# Patient Record
Sex: Male | Born: 1950 | Race: White | Hispanic: No | Marital: Married | Smoking: Current every day smoker
Health system: Southern US, Community
[De-identification: ages and names within clinical notes are randomized; demographics above are authoritative.]

## PROBLEM LIST (undated history)

## (undated) DIAGNOSIS — Z8669 Personal history of other diseases of the nervous system and sense organs: Secondary | ICD-10-CM

## (undated) DIAGNOSIS — Z9289 Personal history of other medical treatment: Secondary | ICD-10-CM

## (undated) DIAGNOSIS — I10 Essential (primary) hypertension: Secondary | ICD-10-CM

## (undated) DIAGNOSIS — I2510 Atherosclerotic heart disease of native coronary artery without angina pectoris: Secondary | ICD-10-CM

## (undated) DIAGNOSIS — S129XXA Fracture of neck, unspecified, initial encounter: Secondary | ICD-10-CM

## (undated) DIAGNOSIS — C4490 Unspecified malignant neoplasm of skin, unspecified: Secondary | ICD-10-CM

## (undated) DIAGNOSIS — E785 Hyperlipidemia, unspecified: Secondary | ICD-10-CM

## (undated) DIAGNOSIS — I219 Acute myocardial infarction, unspecified: Secondary | ICD-10-CM

## (undated) DIAGNOSIS — M1990 Unspecified osteoarthritis, unspecified site: Secondary | ICD-10-CM

## (undated) DIAGNOSIS — R739 Hyperglycemia, unspecified: Secondary | ICD-10-CM

## (undated) DIAGNOSIS — F1190 Opioid use, unspecified, uncomplicated: Secondary | ICD-10-CM

## (undated) DIAGNOSIS — J449 Chronic obstructive pulmonary disease, unspecified: Secondary | ICD-10-CM

## (undated) DIAGNOSIS — K219 Gastro-esophageal reflux disease without esophagitis: Secondary | ICD-10-CM

## (undated) HISTORY — PX: CORONARY ANGIOPLASTY: SHX604

## (undated) HISTORY — DX: Personal history of other medical treatment: Z92.89

## (undated) HISTORY — PX: KNEE SURGERY: SHX244

## (undated) HISTORY — DX: Atherosclerotic heart disease of native coronary artery without angina pectoris: I25.10

## (undated) HISTORY — DX: Fracture of neck, unspecified, initial encounter: S12.9XXA

## (undated) HISTORY — PX: COLONOSCOPY: SHX174

## (undated) HISTORY — DX: Gastro-esophageal reflux disease without esophagitis: K21.9

## (undated) HISTORY — DX: Essential (primary) hypertension: I10

## (undated) HISTORY — DX: Hyperlipidemia, unspecified: E78.5

---

## 1997-05-17 HISTORY — PX: OTHER SURGICAL HISTORY: SHX169

## 1997-05-17 HISTORY — PX: SKIN CANCER EXCISION: SHX779

## 1998-05-17 HISTORY — PX: CARPAL TUNNEL RELEASE: SHX101

## 1999-05-18 HISTORY — PX: PARATHYROIDECTOMY: SHX19

## 2001-06-09 ENCOUNTER — Encounter: Payer: Self-pay | Admitting: Neurosurgery

## 2001-06-09 ENCOUNTER — Encounter: Admission: RE | Admit: 2001-06-09 | Discharge: 2001-06-09 | Payer: Self-pay | Admitting: Neurosurgery

## 2001-06-20 ENCOUNTER — Encounter: Payer: Self-pay | Admitting: Neurosurgery

## 2001-06-21 ENCOUNTER — Encounter: Payer: Self-pay | Admitting: Neurosurgery

## 2001-06-21 ENCOUNTER — Observation Stay (HOSPITAL_COMMUNITY): Admission: RE | Admit: 2001-06-21 | Discharge: 2001-06-22 | Payer: Self-pay | Admitting: Neurosurgery

## 2002-05-17 HISTORY — PX: LUMBAR SPINE SURGERY: SHX701

## 2002-08-03 ENCOUNTER — Ambulatory Visit (HOSPITAL_COMMUNITY): Admission: RE | Admit: 2002-08-03 | Discharge: 2002-08-03 | Payer: Self-pay | Admitting: Neurosurgery

## 2002-08-03 ENCOUNTER — Encounter: Payer: Self-pay | Admitting: Neurosurgery

## 2002-08-13 ENCOUNTER — Encounter: Payer: Self-pay | Admitting: Neurosurgery

## 2002-08-15 ENCOUNTER — Inpatient Hospital Stay (HOSPITAL_COMMUNITY): Admission: RE | Admit: 2002-08-15 | Discharge: 2002-08-19 | Payer: Self-pay | Admitting: Neurosurgery

## 2002-08-15 ENCOUNTER — Encounter: Payer: Self-pay | Admitting: Neurosurgery

## 2002-08-16 ENCOUNTER — Encounter: Payer: Self-pay | Admitting: Neurosurgery

## 2002-08-17 ENCOUNTER — Encounter: Payer: Self-pay | Admitting: Internal Medicine

## 2002-08-19 ENCOUNTER — Encounter: Payer: Self-pay | Admitting: Cardiothoracic Surgery

## 2003-01-25 ENCOUNTER — Encounter: Payer: Self-pay | Admitting: Neurosurgery

## 2003-01-25 ENCOUNTER — Ambulatory Visit (HOSPITAL_COMMUNITY): Admission: RE | Admit: 2003-01-25 | Discharge: 2003-01-25 | Payer: Self-pay | Admitting: Neurosurgery

## 2006-04-05 ENCOUNTER — Ambulatory Visit (HOSPITAL_COMMUNITY): Admission: RE | Admit: 2006-04-05 | Discharge: 2006-04-05 | Payer: Self-pay | Admitting: Neurosurgery

## 2006-04-13 DIAGNOSIS — Z9289 Personal history of other medical treatment: Secondary | ICD-10-CM

## 2006-04-13 HISTORY — DX: Personal history of other medical treatment: Z92.89

## 2006-05-17 DIAGNOSIS — S129XXA Fracture of neck, unspecified, initial encounter: Secondary | ICD-10-CM

## 2006-05-17 HISTORY — DX: Fracture of neck, unspecified, initial encounter: S12.9XXA

## 2006-06-10 ENCOUNTER — Inpatient Hospital Stay (HOSPITAL_COMMUNITY): Admission: RE | Admit: 2006-06-10 | Discharge: 2006-06-14 | Payer: Self-pay | Admitting: Neurosurgery

## 2006-09-06 ENCOUNTER — Ambulatory Visit (HOSPITAL_COMMUNITY): Admission: RE | Admit: 2006-09-06 | Discharge: 2006-09-06 | Payer: Self-pay | Admitting: Neurosurgery

## 2006-09-28 ENCOUNTER — Ambulatory Visit (HOSPITAL_COMMUNITY): Admission: RE | Admit: 2006-09-28 | Discharge: 2006-09-28 | Payer: Self-pay | Admitting: Neurosurgery

## 2006-10-05 ENCOUNTER — Ambulatory Visit (HOSPITAL_COMMUNITY): Admission: RE | Admit: 2006-10-05 | Discharge: 2006-10-06 | Payer: Self-pay | Admitting: Neurosurgery

## 2007-05-18 HISTORY — PX: CORONARY ARTERY BYPASS GRAFT: SHX141

## 2007-05-27 ENCOUNTER — Encounter: Admission: RE | Admit: 2007-05-27 | Discharge: 2007-05-27 | Payer: Self-pay | Admitting: Neurosurgery

## 2007-11-07 ENCOUNTER — Ambulatory Visit (HOSPITAL_COMMUNITY): Admission: RE | Admit: 2007-11-07 | Discharge: 2007-11-07 | Payer: Self-pay | Admitting: Neurosurgery

## 2008-02-29 ENCOUNTER — Inpatient Hospital Stay (HOSPITAL_COMMUNITY): Admission: EM | Admit: 2008-02-29 | Discharge: 2008-03-10 | Payer: Self-pay | Admitting: Emergency Medicine

## 2008-03-01 ENCOUNTER — Ambulatory Visit: Payer: Self-pay | Admitting: Thoracic Surgery (Cardiothoracic Vascular Surgery)

## 2008-03-01 ENCOUNTER — Encounter: Payer: Self-pay | Admitting: Thoracic Surgery (Cardiothoracic Vascular Surgery)

## 2008-03-29 ENCOUNTER — Ambulatory Visit: Payer: Self-pay | Admitting: Thoracic Surgery (Cardiothoracic Vascular Surgery)

## 2008-03-29 ENCOUNTER — Encounter
Admission: RE | Admit: 2008-03-29 | Discharge: 2008-03-29 | Payer: Self-pay | Admitting: Thoracic Surgery (Cardiothoracic Vascular Surgery)

## 2008-12-18 ENCOUNTER — Ambulatory Visit (HOSPITAL_COMMUNITY): Admission: RE | Admit: 2008-12-18 | Discharge: 2008-12-18 | Payer: Self-pay | Admitting: Neurosurgery

## 2009-10-21 ENCOUNTER — Encounter: Admission: RE | Admit: 2009-10-21 | Discharge: 2009-10-21 | Payer: Self-pay | Admitting: Cardiovascular Disease

## 2009-10-24 ENCOUNTER — Inpatient Hospital Stay (HOSPITAL_COMMUNITY): Admission: RE | Admit: 2009-10-24 | Discharge: 2009-10-25 | Payer: Self-pay | Admitting: Cardiovascular Disease

## 2009-10-24 HISTORY — PX: CARDIAC CATHETERIZATION: SHX172

## 2009-10-29 DIAGNOSIS — Z9289 Personal history of other medical treatment: Secondary | ICD-10-CM

## 2009-10-29 HISTORY — DX: Personal history of other medical treatment: Z92.89

## 2010-01-26 IMAGING — CR DG CHEST 1V PORT
1 series · 1 of 1 positions shown · non-contrast
Comparison: 06/10/2006 study

CLINICAL DATA: History given of chest pain.  History of tobacco
smoking.  History of coronary artery disease with stent placement.
Hypertension.

PORTABLE CHEST - 1 VIEW

[AP]
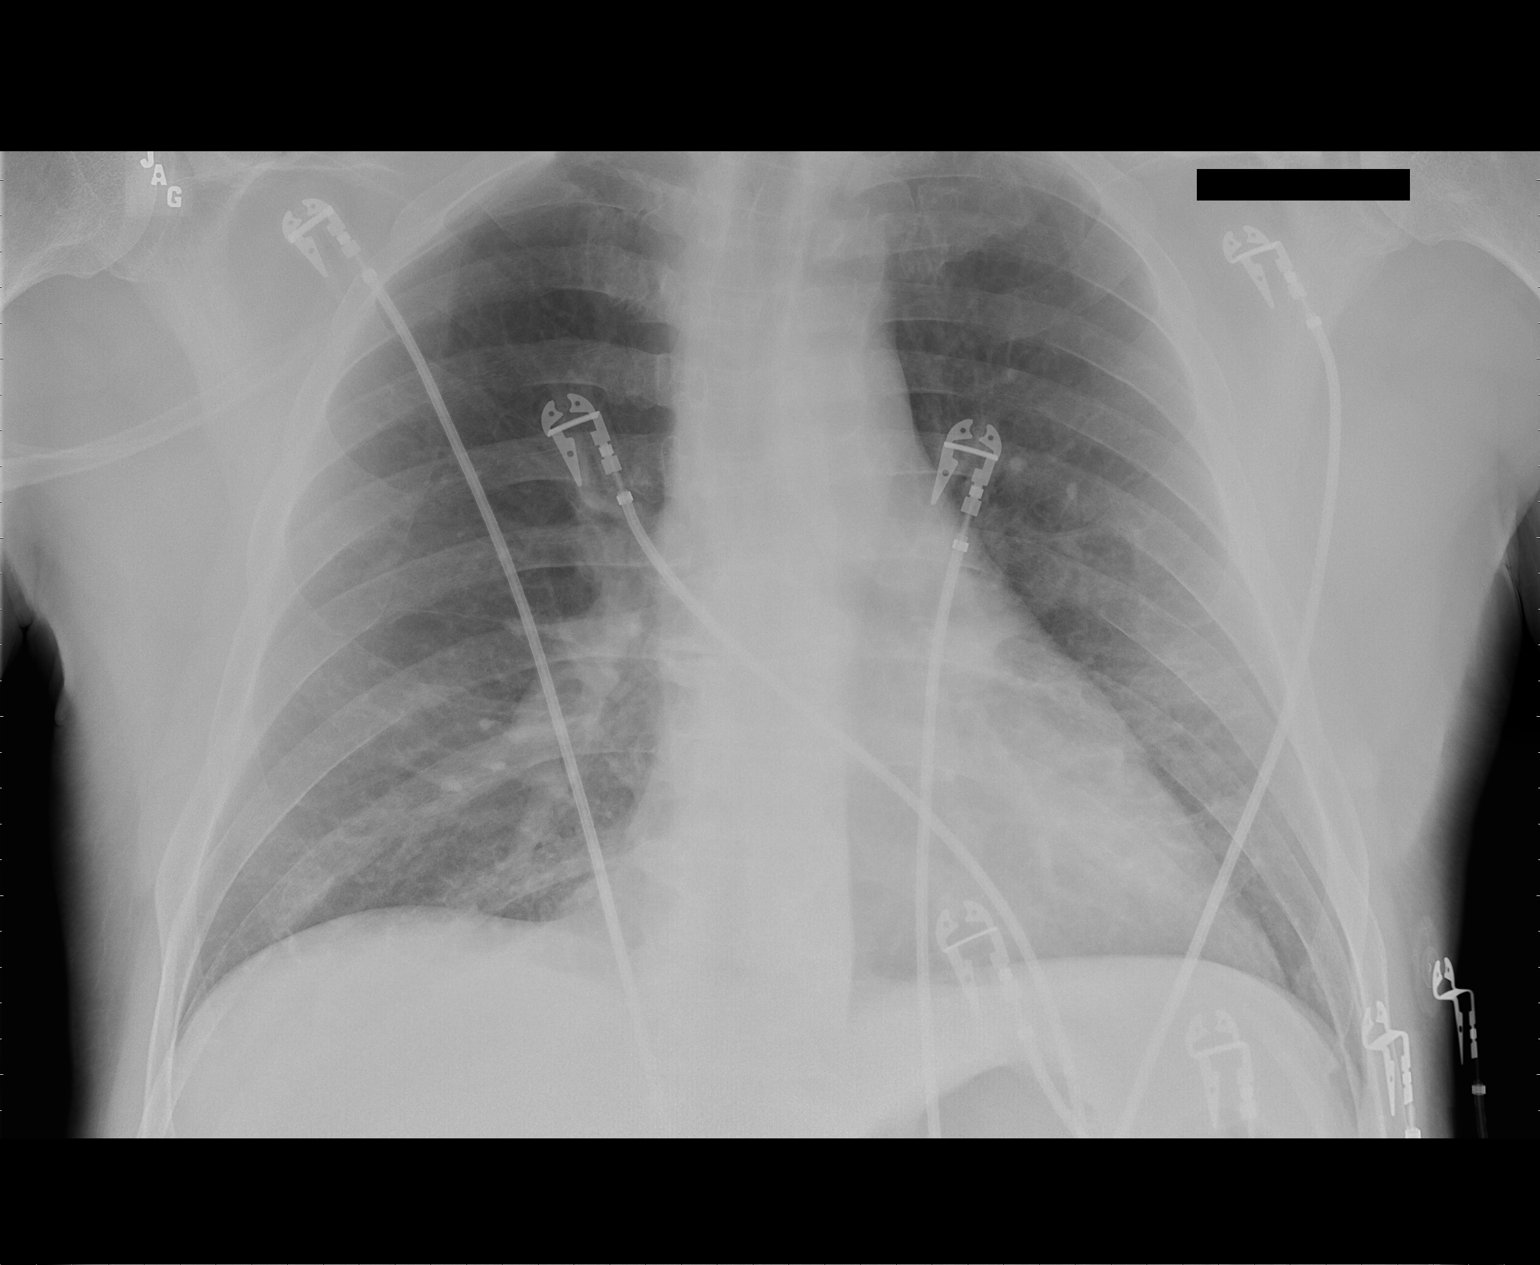

[1 of 1 positions shown; findings below may reference images not displayed]

FINDINGS: Cardiac silhouette is borderline in size accentuated by
AP magnification.  No pulmonary edema, pneumonia, or pleural
effusion is seen. Chronic slightly increased reticular markings in
the bases are seen. Bones appear average for age.
IMPRESSION: Borderline cardiac size.  No acute process evident.

## 2010-08-03 LAB — CBC
HCT: 36.6 % — ABNORMAL LOW (ref 39.0–52.0)
HCT: 36.7 % — ABNORMAL LOW (ref 39.0–52.0)
Hemoglobin: 12.4 g/dL — ABNORMAL LOW (ref 13.0–17.0)
MCHC: 33.9 g/dL (ref 30.0–36.0)
MCV: 92.4 fL (ref 78.0–100.0)
Platelets: 159 10*3/uL (ref 150–400)
RBC: 3.97 MIL/uL — ABNORMAL LOW (ref 4.22–5.81)
WBC: 6.6 10*3/uL (ref 4.0–10.5)
WBC: 7.9 10*3/uL (ref 4.0–10.5)

## 2010-08-03 LAB — BASIC METABOLIC PANEL
BUN: 13 mg/dL (ref 6–23)
Calcium: 8.7 mg/dL (ref 8.4–10.5)
Creatinine, Ser: 0.91 mg/dL (ref 0.4–1.5)
GFR calc non Af Amer: >60 mL/min (ref >60)
Potassium: 4.3 mEq/L (ref 3.5–5.1)

## 2010-08-03 LAB — MRSA PCR SCREENING: MRSA by PCR: NEGATIVE

## 2010-09-29 NOTE — Cardiovascular Report (Signed)
Robert Melton, Robert Melton NO.:  [id number]   MEDICAL RECORD NO.:  [id number]          PATIENT TYPE:  INP   LOCATION:  2922                         FACILITY:  [facility]   PHYSICIAN:  Nicki Guadalajara, M.D.     DATE OF BIRTH:  07-10-1950   DATE OF PROCEDURE:  03/01/2008  DATE OF DISCHARGE:                            CARDIAC CATHETERIZATION   INDICATIONS:  [name] is a 60 year old gentleman who has known  coronary artery disease.  He suffered a prior myocardial infarction  treated with angioplasty at [facility] in 1991.  In 1993, he underwent  coronary stenting at [facility] and his last catheterization was in August  2000.  He has a history of hypertension, mixed hyperlipidemia, as well  as degenerative back disease status post back surgery.  He was admitted  to [facility] yesterday after presenting to Dr. Clarene Duke' office  yesterday with several week history of initial indigestion-like  sensation, but ultimately, this had progressed to more chest pain and  ultimately chest pain awakened him from sleep yesterday morning.  His  symptom complex is worrisome for unstable angina.  He was transferred to  [facility] for admission and is now referred for cardiac  catheterization.   PROCEDURE:  After premedication with Versed 2 mg intravenously, the  patient was prepped and draped in the usual fashion.  His right femoral  artery was punctured anteriorly and a 5-French sheath was inserted.  Diagnostic catheterization was done utilizing 5-French Judkins for left  and right coronary catheters.  The right catheter was used for selective  angiography into the left subclavian internal mammary artery system due  to potential need for bypass surgery.  A pigtail catheter was used for  biplane cine left ventriculography as well as distal aortography.  Hemostasis was obtained by direct manual pressure.   HEMODYNAMIC DATA:  Central aortic pressure was 130/61.  Left ventricular  pressure was 130/18.   ANGIOGRAPHIC DATA:  Left main coronary was very short and immediately  bifurcated into an LAD and left circumflex system.   The LAD had 95% ostial stenosis encroaching upon the distal left main.  The LAD had 30% narrowing between 2 small diagonal vessels.  The LAD was  a twin-like vessel and an LAD and diagonal vessel, which did not reach  the apex.   The circumflex vessel had narrowing of 50% just beyond the origin  followed by 99% and then 90% stenosis before the first major marginal  vessel.   The right coronary artery had what appeared to be long segment of  stenting in its midsegment.  There was 30-40% narrowing in the proximal  to mid segment followed by 40-50% narrowing before the crux.  Beyond the  crux, there did appear to be eccentric 60-70% narrowing with a shelf-  like lesion, mild haziness.  The RCA supplied the PDA and PLA vessel.   The left subclavian and internal mammary artery were normal and suitable  for CABG surgery.   Biplane cine left ventriculography revealed low-normal LV contractility  with an ejection fraction of 50-55%.  There  was mild mid anterolateral  hypercontractility on the RAO projection, and on the LAO projection,  there was mid posterolateral hypercontractility.   Distal aortography revealed patent renal arteries.  There was no  significant aneurysm dilatation of his aorta or significant aortoiliac  disease.   IMPRESSION:  1. Low-normal left ventricular function with mild anterolateral-to-mid      posterolateral hypercontractility.  2. Severe native coronary obstructive disease with ostial 95% left      anterior descending stenosis with 30% proximal left anterior      descending narrowing; high-grade segmental 50-99 and 90% proximal      circumflex stenoses; and previously placed diffuse stenting of the      right coronary artery with in-stent narrowing of 30-40% proximally,      40-50% in the mid segment but with  evidence for progressive 60-70%      narrowing in the right coronary artery beyond the crux prior to      giving rise to the posterior descending artery and posterolateral      artery vessel.  3. No significant aortoiliac disease.   RECOMMENDATIONS:  Surgical consultation will be obtained for  consideration for CABG revascularization surgery.  The patient will be  transferred from 4700, and due to a high-grade stenoses, he will be  started on heparin following sheath removal until his probable CABG  revascularization.           ______________________________  Nicki Guadalajara, M.D.     TK/MEDQ  D:  03/01/2008  T:  03/02/2008  Job:  [id number]   cc:   Jeanmarie Plant  [facility]

## 2010-09-29 NOTE — Consult Note (Signed)
NAMETERRELLE, RUFFOLO NO.:  [id number]   MEDICAL RECORD NO.:  [id number]          PATIENT TYPE:  INP   LOCATION:                               FACILITY:  [facility]   PHYSICIAN:  Salvatore Decent. Dorris Fetch, M.D.DATE OF BIRTH:  24-Aug-1950   DATE OF CONSULTATION:  03/01/2008  DATE OF DISCHARGE:                                 CONSULTATION   REASON FOR CONSULTATION:  Three-vessel disease with unstable angina.   HISTORY OF PRESENT ILLNESS:  Mr. Vizcarrondo is a 60 year old gentleman with  a history of coronary disease dating back to age 26, roughly 19 years  ago, when he had a myocardial infarction.  He had another myocardial  infarction 2 years later and had PTCA and stenting of his right coronary  at that time.  He had done well since that time until the last couple of  weeks when he began having frequent pain, which he thought initially was  indigestion.  It initially was exertional but he has also been having  episodes at rest, and they have been coming after initially being very  brief, have now become frequent, occurring several times a day, and  lasting for longer periods of time.  He was awakened by the pain at 3:00  a.m. on February 29, 2008.  EMS was called, and he was brought to the  emergency room.  His initial cardiac enzymes were negative with a CK-MB  of less than 1 and a troponin of less than 0.05.  He ruled out for  myocardial infarction, and given his history and presentation, cardiac  catheterization was recommended.  Dr. Nicki Guadalajara did cardiac  catheterization today, which showed a 95% ostial LAD stenosis.  There  was a 99% stenosis in the left circumflex.  There was diffuse disease,  both in stent and extra stent disease in the right coronary.  His  ejection fraction was 50-55% with some mild anterolateral hypokinesis.   The patient is currently pain free.   His past medical history is significant for:  1. Coronary artery disease.  2. Myocardial infarction  x2.  3. PTCA and stenting of his right coronary.  4. Hypertension.  5. Hypercholesterolemia.  6. Previous knee surgery.  7. Previous carpal tunnel syndrome.  8. Broken neck due to fall off ladder last year.  9. Parathyroidectomy.  10.Four lower back surgeries.  11.Skin cancer inside the left cheek and behind his ear.  12.Possible TIA, 12 years ago.  13.Leg cramps.  14.Gastroesophageal reflux.  15.Degenerative joint disease.   MEDICATIONS ON ADMISSION:  1. Norvasc 5 mg daily.  2. Lisinopril 20 mg daily.  3. Prilosec, over the counter.  4. Taking a stool softener.  5. Crestor 20 mg daily.  6. Flexeril 10 mg once daily.  7. Klonopin p.r.n.  8. He was taking Aleve 2 tablets nightly.   He had stopped his aspirin.   He has no known drug allergies.   His family history is significant for coronary artery disease in his  mother at young age.   SOCIAL HISTORY:  He is married.  He  is disabled.  He did smoke but quit  for the most part of February, although he still smokes 2-3 cigarettes  per day.  He exercises very limited due to his disability.  He with his  wife cares for 4 grandchildren at home.   PHYSICAL EXAMINATION:  GENERAL:  Mr. Dube is a 60 year old white male  in no acute distress.  His general appearance is well developed and well  nourished.  NEUROLOGIC:  He is alert and oriented x3.  He does have weakness in his  right hand and upper extremity.  HEENT:  Unremarkable.  NECK:  Supple without thyromegaly, adenopathy, or bruits.  There is a  well-healed surgical incision.  CARDIAC:  Regular rate and rhythm.  Normal S1 and S2.  No rubs, murmurs,  or gallops.  ABDOMEN:  Soft and nontender.  EXTREMITIES:  Without clubbing, cyanosis, or edema.  He has multiple  healed surgical scars.  He has thenar wasting in the right hand.  Equivocal Allen test bilaterally.   REVIEW OF SYSTEMS:  It should be noted he has indigestion, history of  kidney stones.  No orthopnea,  paroxysmal nocturnal dyspnea, or  peripheral edema.  Arm and leg cramps and pain in multiple joints.  All  other systems are negative.   IMPRESSION:  Mr. Dues is a 60 year old gentleman who has a long-  standing history of coronary disease.  He now presents with an unstable  coronary syndrome and has severe 3-vessel coronary disease with  preserved left ventricular function at catheterization.  Coronary artery  bypass grafting is indicated for survival benefit as well as relief of  symptoms.  I have discussed in detail with him the indications, risks,  benefits, and alternatives.  He understands the risks to include but not  limited to death, stroke, MI, DVT, PE, bleeding, possible need for  transfusions, infections, as well as other organ system dysfunction  including respiratory, renal, or GI complications.  Given his disease,  we will plan to use bilateral mammary arteries with a right mammary to  the circumflex distribution or possibly to the LAD and place the left  mammary to the circumflex distribution.  He has 2 obtuse marginals, but  they interconnect and are not separately compromised and will need the  use of vein graft bypasses to posterior descending and posterolateral.  Mr. Sippel understands and accepts these risks and agrees to proceed.  We will plan to have a surgery on Monday morning, first case.      Salvatore Decent Dorris Fetch, M.D.  Electronically Signed    SCH/MEDQ  D:  03/01/2008  T:  03/02/2008  Job:  [id number]   cc:   Nicki Guadalajara, M.D.  Jeanmarie Plant

## 2010-09-29 NOTE — Discharge Summary (Signed)
Robert Melton, Robert Melton NO.:  [id number]   MEDICAL RECORD NO.:  [id number]          PATIENT TYPE:  INP   LOCATION:  2015                         FACILITY:  [facility]   PHYSICIAN:  Salvatore Decent. Dorris Fetch, M.D.DATE OF BIRTH:  17-Aug-1950   DATE OF ADMISSION:  02/29/2008  DATE OF DISCHARGE:                               DISCHARGE SUMMARY   FINAL DIAGNOSIS:  Three-vessel coronary artery disease with unstable  coronary syndrome.   IN-HOSPITAL DIAGNOSES:  1. Bilateral 40-60% internal carotid artery stenosis.  2. Thrombocytopenia postoperatively.  3. Acute blood loss anemia postoperatively.  4. Volume overload postoperatively.   SECONDARY DIAGNOSES:  1. History of coronary artery disease with myocardial infarction x2,      status post percutaneous transluminal coronary angioplasty and      stenting of his right coronary artery.  2. Hypertension.  3. Hypercholesterolemia.  4. Status post knee surgery.  5. Status post carpal tunnel surgery.  6. History of broken neck due to fall from a ladder last year.  7. Parathyroidectomy.  8. Lower back surgeries.  9. Skin cancer inside the left cheek and behind his ear.  10.Possible transient ischemic attack about 12 years ago.  11.Leg cramps.  12.Gastroesophageal reflux disease.  13.Degenerative joint disease.   IN-HOSPITAL OPERATIONS AND PROCEDURES:  1. Cardiac catheterization.  2. Coronary artery bypass grafting x4, off pump, using a left internal      mammary artery to left anterior descending, free right internal      mammary artery to obtuse marginal 1, sequential saphenous vein      graft to posterior descending posterolateral.  Endoscopic vein      harvest from left thigh done.   HISTORY AND PHYSICAL AND HOSPITAL COURSE:  Robert Melton is a 60 year old  gentleman with a longstanding history of coronary artery disease status  post myocardial infarction with PTCA and stenting of his right coronary  artery.  He presented  with unstable coronary syndrome and underwent  cardiac catheterization which showed severe three-vessel coronary artery  disease with mild decreased left ventricular function.  Dr. Dorris Fetch  was consulted.  Dr. Dorris Fetch evaluated the patient's cardiac  catheterization and discussed with the patient undergoing coronary  artery bypass grafting.  He discussed risks and benefits with the  patient.  The patient acknowledged understanding and agreed to proceed.  Surgery was scheduled for March 04, 2008.  Preoperatively, the patient  underwent bilateral carotid duplex ultrasound and this showed 40-60% ICA  stenosis bilaterally.  The patient remained stable preoperatively.  For  details of the patient's past medical history and physical exam, please  see dictated H&P.   The patient was taken to the operating room on March 04, 2008, by Dr.  Dorris Fetch where he underwent off-pump coronary artery bypass grafting  x4 using a left internal mammary artery to left anterior descending,  free right internal mammary artery to obtuse marginal 1, sequential  saphenous vein graft to posterior descending posterolateral branches.  Endoscopic vein harvest of left thigh was done.  The patient tolerated  this procedure and was transferred to the intensive care  unit in stable  condition.  The patient was noted to be hemodynamically stable  postoperatively.  He was able to be extubated evening of surgery.  Post-  extubation, the patient was noted to be alert and oriented x4, neuro  intact.  Post-extubation, the patient was placed on a Ventimask 50%.  O2  sats were stable.  Chest x-ray done, was stable with small left pleural  effusion.  He had minimal drainage from chest tubes and chest tubes were  discontinued in normal fashion.  The patient's pulmonary status was slow  to improve.  He was eventually able to be weaned off the face mask and  placed on nasal cannula with sats maintaining in the 90s.   Repeat chest  x-ray done showed mild pulmonary edema.  The patient was on diuretics.  By postop day #4, the patient currently remains on 4 liters with O2 sats  99%.  Repeat chest x-ray showed improving edema and air space disease.  The patient was started on Combivent inhaler.  We will continue to wean  the patient off oxygen as tolerated to  room air with O2 sats  maintaining greater than 90% prior to discharge home.  Postoperatively,  the patient was able to be weaned off all drips.  Swan-Ganz catheter was  discontinued in normal fashion.  External pacer had been set at 90 and  was turned off.  The patient's heart rate maintained greater than 60.  Heart rate and blood pressure were monitored.  He was started on low-  dose beta-blocker and restarted on low dose of his ACE inhibitors.  The  patient remained in normal sinus rhythm and blood pressure remained  stable.  Postoperatively, the patient did have some mild acute blood  loss anemia.  He was asymptomatic.  No transfusions were required.  His  hemoglobin and hematocrit were followed.  His H&H remained stable and by  postop day #4, it was 8.6 and 24.9.  We will continue to monitor this.  The patient did have some significant volume overload postoperatively.  He was started on diuretics.  This was helping with his pulmonary edema  as well as his peripheral edema.  Daily weights were obtained.  We will  plan to continue diuretics several days post discharge.  The patient was  back near his preoperative weight prior to discharge.  The patient also  had some mild thrombocytopenia postoperatively.  Platelet count dropped  to 97.  Lovenox was discontinued.  Platelet count was monitored and was  improving prior to discharge.  By postop day #4, it was 118.   Postoperatively, the patient was out of bed, ambulating well with  cardiac rehab.  He was able to be transferred out to 2000 on postop day  #3.  The patient was tolerating diet well.  No  nausea or vomiting noted.  All incisions were clean, dry, and intact and healing well.   The patient is tentatively ready for discharge home in the next 48 hours  pending he remained stable and pulmonary status improved.  Labs on  postop day #4 showed a white count of 6.7, hemoglobin of 8.6, hematocrit  24.9, and platelet count 118.  Sodium of 135, potassium 3.7, chloride of  102, bicarb of 25, BUN of 24, creatinine 0.8, and glucose of 89.  The  patient was noted to be afebrile.  He is back at baseline weight.   FOLLOWUP APPOINTMENTS:  A followup appointment has been arranged with  Dr. Dorris Fetch for  March 29, 2008, at 11:30 a.m..  The patient will  need to obtain PA and lateral chest x-ray 30 minutes prior to this  appointment.  The patient will need to follow up with Dr. Tresa Endo in 2  weeks.  He will need to contact Dr. Landry Dyke office to make these  arrangements.   ACTIVITY:  The patient is instructed no driving until released to do so.  No heavy lifting over 10 pounds.  He is told to ambulate 3-4 times per  day, progress as tolerated and continue his breathing exercises.   INCISIONAL CARE:  The patient is told to shower, washing his incisions  using soap and water.  He is to contact the office if he develops any  drainage or opening from any of his incision sites.   DIET:  The patient is educated on diet to be low-fat, low-salt.   DISCHARGE MEDICATIONS:  1. Crestor 20 mg daily.  2. Multivitamin daily.  3. Klonopin 0.5 mg p.r.n.  4. Lisinopril 5 mg daily.  5. Aspirin 325 mg daily.  6. Lopressor 25 mg b.i.d.  7. Lasix 40 mg daily x7 days.  8. Potassium chloride 20 mEq daily x7 days.  9. Oxycodone 5 mg 1-2 tablets q.4-6 h. p.r.n.  10.Combivent inhaler 2 puffs inhaled q.i.d.      Robert Belfast, PA      Salvatore Decent. Dorris Fetch, M.D.  Electronically Signed    KMD/MEDQ  D:  03/08/2008  T:  03/08/2008  Job:  [id number]   cc:   Salvatore Decent. Dorris Fetch, M.D.  Nicki Guadalajara, M.D.

## 2010-09-29 NOTE — Op Note (Signed)
NAMEJENCARLOS, NICOLSON NO.:  [id number]   MEDICAL RECORD NO.:  [id number]          PATIENT TYPE:  INP   LOCATION:  2302                         FACILITY:  [facility]   PHYSICIAN:  Salvatore Decent. 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   PROCEDURE:  Median sternotomy, off-pump coronary artery bypass grafting  x4 (left internal mammary artery to LAD, free right internal mammary  artery to obtuse marginal 1, sequential saphenous vein graft to  posterior descending and posterior lateral), and endoscopic vein  harvest, left thigh.   SURGEON:  Salvatore Decent. Dorris Fetch, MD   ASSISTANT:  Kerin Perna, MD   SECOND ASSISTANT:  Doree Fudge, PA   ANESTHESIA:  General.   FINDINGS:  Severe aortic calcified plaque.  No site for placement of  crossclamp.  Therefore, the patient done off pump.  Aorta at site of  proximal anastomosis relatively normal in thickness with no plaque.  Vein localized in right leg; vein from the left leg, good quality; both  mammary arteries, good quality; the posterior descending 1 mm poor  quality target; posterior lateral and obtuse marginal impaired quality  targets; LAD good quality target.   CLINICAL NOTE:  Mr. Wint is a 60 year old gentleman with a  longstanding history of coronary artery disease who presents with  unstable coronary syndrome and at catheterization had severe 3-vessel  coronary disease with mild decreased left ventricular function.  The  patient was advised to undergo coronary artery bypass grafting, the  indications, risks, benefits, and alternatives were discussed in detail  with the patient.  He understood and accepted  the risks and agreed to  proceed.   OPERATIVE NOTE:  Mr. Pina was brought to the preop holding area on  March 04, 2008.  Lines were placed by Anesthesia for monitoring  arterial central venous and pulmonary arterial pressure.  Intravenous  antibiotics were administered.  The patient was taken to the operating  room, anesthetized and intubated.  A Foley catheter was placed.  The  chest, abdomen, and legs were prepped and draped in usual fashion.  An  incision was made in the medial aspect of the right leg just below the  level near the greater saphenous vein could not be localized at that  site.  Incision made in the medial aspect of the left leg at the level  of the knee, and the greater saphenous vein was identified, there was  harvested endoscopically and was a good quality vessel.  Simultaneously,  a median sternotomy was performed.  The left internal mammary artery was  taken down using standard technique.  Two thousand units of heparin was  administered during the vessel, take down the left mammary artery had  excellent flow when divided distally.  The right internal mammary artery  then was harvested in similar fashion.  This initially  left attached  proximally to see if it would reach as a pedicle graft to the LAD.  However, it was not of sufficient length and therefore was used as a  free graft.  The proximal end was divided and the proximal stump was  suture ligated.   The pericardium was opened.  The aorta was inspected, and there was an  extensive calcified plaque anteriorly and laterally along the right side  of the aorta.  It would be possible to cannulate the aorta.  But not  possible safely place an aortic crossclamp.  Therefore, the decision was  made to attempt the procedure as an off-pump procedure to avoid need for  aortic clamping.  The full dose of heparin had already been  administered.  Anticoagulation was monitored with ACT measurement.  A  bear hugger was  placed on the patient to help maintain body temperature.  #1 silk deep pericardial stay sutures were placed and covered with Rumel  tourniquets.  Traction was placed on the pericardium prolapsing the apex  of the heart upwards and out of the chest which the patient tolerated  well hemodynamically.  A Guidant expose system was used for cardiac  retraction and stabilization.  The coronary arteries were inspected and  anastomotic sites were chosen, and the conduits were inspected and cut  to length.  The apex of the heart was retracted superiorly exposing the  inferior wall of the heart, posterior descending, posterolateral vessels  were identified.  The stabilizing device was placed at the proximal  portion of the posterior descending.  This was relatively small and very  diffusely diseased vessel even more so that apparent on catheterization.  An arteriotomy was made and extended proximally and distally.  A 1.5-mm  shunt would not pass into the vessel.  A Silastic loop was placed  proximally on the vessel for control with hemostasis.  The vessel was  poor quality.  A side-to-side anastomosis to the posterior descending  was performed with a running 7-0 Prolene suture.  A 1-mm probe did pass  distally through the anastomosis as well as proximally to the site of  more extensive plaquing in the proximal vessel.  The stabilizer then was  moved to the site for anastomosis on the posterior lateral branch.  The  vein graft was cut to length distally.  An arteriotomy was made, a 1.5  mm shunt was placed within the vessel, which was 1.5 mm in diameter.  The vein graft was anastomosed end-to-side with a running 7-0 Prolene  suture.  The shunt was removed prior to tying the suture.  Warm  heparinized saline was flushed through the graft.  There was excellent  flow.  There was good hemostasis at both anastomoses.   Next, the apex of the heart was retracted to the right side exposing the  lateral wall  of the heart.  The stabilizer bar was placed at the site on  the first obtuse marginal.  The patient had a 90% stenosis in his  circumflex, prior to the bifurcations of obtuse marginals 1 and 2, which  communicated.  The distal end of the right internal mammary artery was  beveled and was anastomosed in an end-to-side fashion with a running 8-0  Prolene suture to the obtuse marginal.  Shunt was again used during the  placement of this graft as well.  Completion of the anastomosis, the  shunt was removed.  The suture was tied.  There was good hemostasis.  There was  good backbleeding proximally and the bulldog clamp was placed  across the vessel.   The heart then was allowed to rest in a more natural location.  The  apical suction device was removed.  However, the patient developed ST  elevations at this point of the procedure.  The incision was made to  proceed with these proximal anastomoses with these grafts prior to doing  the mammary to LAD anastomosis.  The patient did remain hemodynamically  stable and did not have any malignant arrhythmias.  The Guidant  Heartstring III device was used to avoid placing a clamp on the aorta.  The vein graft was cut to length.  The 4.3-mm punch aortotomy was made  and the pursestring was deployed within the aorta.  There was a good  seal with good hemostasis.  The vein graft was anastomosed end-to-side  with a running 6-0 Prolene suture.  At the completion of the  anastomosis, the Heartstring device was removed prior to tying the  suture.  Residual air was aspirated from the vein graft.  Bulldog clamp  was removed and flow was established to the posterior descending and  posterior lateral.  At this time, the ST elevation had resolved.  The  short segment of the vein was anastomosed to the proximal end of the  free right mammary graft to allow better size match to the aortotomy.  This was done with running 7-0 Prolene suture.  Once again, a 4.3-mm   punch aortotomy was made in the aorta.  The Heartstring device was  deployed, and the proximal anastomosis was performed with a running 6-0  Prolene suture.  The heart string was removed and the suture was tied.   Next, traction was once again placed on the deep pericardial sutures  prolapsing the heart anteriorly, exposing the LAD.  The stabilizing  device was placed at the site of the anastomosis to the LAD, which was a  1.5-mm vessel.  An arteriotomy was made and a 1.5 mm shunt was placed  within the vessel.  The distal end of the left mammary artery was  beveled, was anastomosed end-to-side to the LAD with a running 8-0  Prolene suture.  At the completion of the mammary to LAD anastomosis,  the shunt was removed.  The bulldog clamps removed from the left mammary  artery, the anastomosis was de-aired, and the suture was tied.  After  inspecting for hemostasis, the mammary pedicle was tacked to the  epicardial surface of the heart with 6-0 Prolene sutures.   The final inspection for hemostasis was made at all proximal and distal  anastomoses.  A test dose protamine was administered, and was well  tolerated.  The remainder of the protamine was administered without  incident.  Hemostasis was achieved.  Epicardial pacing wires were placed  on the right ventricle and right atrium.  The pericardium was  reapproximated with interrupted 3-0 silk sutures after achieving  hemostasis.  Bilateral pleural and single mediastinal chest tube were  placed with separate subcostal incisions.  The sternum was closed with  single and double heavy gauge stainless steel wires.  The pectoralis  fascia, subcutaneous tissue, and skin were closed in standard fashion.  All sponge, needle, and instrument counts were correct at the end of  procedure.  There were no intraoperative complications.  The patient was  taken from the operating room to the surgical intensive care unit in  critical but stable  condition.   Note, care should be taken  if any attempts made at redo surgery in  regards to the calcified ascending aorta.      Salvatore Decent Dorris Fetch, M.D.  Electronically Signed     SCH/MEDQ  D:  03/04/2008  T:  03/05/2008  Job:  [id number]   cc:   Nicki Guadalajara, M.D.  Jeanmarie Plant

## 2010-09-29 NOTE — Assessment & Plan Note (Signed)
OFFICE VISIT   RAYAN, INES  DOB:  1950/12/14                                        March 29, 2008  CHART #:  [id number]   The patient is a 60 year old gentleman, who presented with unstable  angina.  He had coronary artery bypass grafting x4 on March 04, 2008.  This was done off-pump because of severe calcification of his ascending  aorta.  He did well postoperatively with no significant complications.  She continues to do well.  He still has some soreness.  He still takes  one or two Percocet before he goes to bed at night, but he does not have  any use them during the day.  He does not have any anginal-type  symptoms.  His exercise tolerance is good.  He is anxious to increase  his activities.   PHYSICAL EXAMINATION:  GENERAL:  The patient is a 60 year old white male  in no acute distress.  VITAL SIGNS:  His blood pressure is 105/90, pulse 68, respirations are  18, his oxygen saturation 97% on room air.  LUNGS:  Clear with equal breath sounds bilaterally.  CARDIAC:  Regular rate and rhythm.  Normal S1 and S2.  No murmurs, rubs,  or gallops.  CHEST:  Sternum is stable.  Sternal incision is clean, dry, and intact.  EXTREMITIES:  Leg incision is healing well.  There is no peripheral  edema.   Chest x-ray shows some linear atelectasis in the lingula, otherwise  unremarkable.   IMPRESSION:  The patient is a 60 year old gentleman.  He is doing well,  about 4 weeks out from off-pump coronary artery bypass grafting.  He is  not to lift anything greater than 10 pounds for another 2 weeks at least  and asked him to wait at least another month before using his shotgun.  He is anxious to go hunting and he may begin driving, appropriate  precautions were discussed.  He is not to drive while using narcotics.  He will continued to be followed by Dr.  Clarene Duke and Dr. Tresa Endo.  I would be happy to see him back anytime if I  can be of any further assistance  with his care.   Salvatore Decent Dorris Fetch, M.D.  Electronically Signed   SCH/MEDQ  D:  03/29/2008  T:  03/29/2008  Job:  [id number]   cc:   Nicki Guadalajara, M.D.  Jeanmarie Plant

## 2010-09-29 NOTE — Op Note (Signed)
NAMEROOK, MAUE NO.:  [id number]   MEDICAL RECORD NO.:  [id number]          PATIENT TYPE:  OIB   LOCATION:  3172                         FACILITY:  [facility]   PHYSICIAN:  Robert Melton, M.D.     DATE OF BIRTH:  10-Jan-1951   DATE OF PROCEDURE:  10/05/2006  DATE OF DISCHARGE:                               OPERATIVE REPORT   PREOPERATIVE DIAGNOSES:  1. Lumbar stenosis, L2-3.  2. Malposition, right L4 pedicle screw.   POSTOPERATIVE DIAGNOSES:  Lumbar stenosis L2-3, disk herniation L2-3,  malposition of screw.   PROCEDURE:  1. Removal of hardware right side L4 and S1 pedicle screws along with      the rod.  2. Decompressive laminectomy L2-3 with diskectomy L2-3 right side for      decompression.   COMPLICATIONS:  None   INDICATIONS:  [name] is a patient of mine whom underwent a  fusion from L4 to S1 and a left-sided decompression at the L2-3 space.  Postoperatively, he had no back pain, but he continued to have left  lower extremity pain.  I did a CT within the last month, and it showed  that the pedicle on the right side at L4 was medial entering the spinal  canal.  He had absolutely no pain nor did he have any weakness on that  side, but I did recommend that screw be repositioned.  He also continues  to have stenosis around L2-3 site, and I told him that I would just do a  much bigger decompression at that level.   OPERATIVE NOTE:  Ms. Robert Melton was brought to the operating room,  intubated and placed under general anesthetic without difficulty.  He  was rolled prone onto a Wilson frame, and all pressure points were  properly padded.  A Foley catheter had been placed under sterile  conditions without difficulty.  I opened his old incision with a #10  blade and took this down to the thoracolumbar fascia after he was  prepped and draped.  I then exposed the right side L4 pedicle screw and  S1 pedicle screw and rod.  I removed the caps, removed the  rod and then  removed the right L4 screw.  I was able to see the screw in the spinal  canal, so I retracted the thecal sac medially with a double-ended  ganglion knife, and Dr. Newell Melton who was my assistant was able to  unscrew the screw without difficulty.  After that and assessing the  situation, felt that there was no need to replace the hardware.  He had  screws on the left side, had an interbody fusion and the screws had been  in place for approximately 4 months.  I then turned my attention to the  L2-3 and 1-2 space.  I performed a decompression by removing the L2  lamina.  After doing that, I had identified some compression of the  nerve root on the left side.  I opened the disk space and removed a  great deal of disk material from it and did decompress the  L2 and L3  roots.  I irrigated the wound.  I did use a microscope and  microdissection to make sure that I did not hurt the nerve roots which I  did not.  I then irrigated the wound.  I then closed the wound in  layered fashion using Vicryl sutures.  Dermabond was used for sterile  dressing.   ADDENDUM:  1. Removal of hardware.  2. Decompression L2 and L3 nerve roots via laminectomy with      microdissection and diskectomy.           ______________________________  Robert Melton, M.D.     KC/MEDQ  D:  10/05/2006  T:  10/05/2006  Job:  [id number]

## 2010-10-02 NOTE — Discharge Summary (Signed)
NAMEPRUDENCIO, VELAZCO NO.:  [id number]   MEDICAL RECORD NO.:  [id number]                   PATIENT TYPE:  INP   LOCATION:  2034                                 FACILITY:  [facility]   PHYSICIAN:  Stefani Dama, M.D.               DATE OF BIRTH:  [birth year]   DATE OF ADMISSION:  08/15/2002  DATE OF DISCHARGE:  08/19/2002                                 DISCHARGE SUMMARY   ADMISSION DIAGNOSES:  1. Degenerative disk disease L5-S1.  2. Spondylolisthesis.  3. Lumbar radiculopathy.   DISCHARGE DIAGNOSES:  1. Degenerative disk disease L5-S1.  2. Spondylolisthesis.  3. Lumbar radiculopathy.  4. Coronary artery disease.  5. Gastroesophageal reflux disease.   HOSPITAL COURSE:  The patient is a 60 year old individual who had  significant back pain and hip pain.  He initially noted that he had right  hip pain.  Some time ago he underwent surgery and then he developed left hip  pain and was advised regarding surgical decompression of his  spondylolisthesis.  He underwent this procedure and tolerated it well.  Postoperatively, however, on the 1st of April he was noted to have  significant difficulty with chest pain.  He does have some history of known  coronary artery disease and he was seen by the cardiology consultation,  Pricilla Riffle, M.D., who suggested that he should be on a monitored bed and  he was transferred to the telemetry unit.  Neurologically, he remained  stable and cardiographically he remained stable.  However, because of his  history of coronary artery disease and a history of smoking, it was felt  that he should have a Cardiolite stress test.  This was performed on the  4th.  The study demonstrated that he had no active ischemic areas.  It was  felt that he could be discharged.  In relation to his symptoms, it was  suspected that he may have symptoms from his gastroesophageal reflux  disease.   DISCHARGE MEDICATIONS:  At the time of  discharge, he was given a  prescription or Percocet, dispense #60 without refills; Valium, dispense  #40 without refills.  He will be given one final dose of Toradol to decrease  the levels of discomfort that he now has in his right hip since the time of  surgery.  His incision is clean and dry.   FOLLOW UP:  He will be seen in followup in three weeks' time by Coletta Memos, M.D.   CONDITION ON DISCHARGE:  Improving.                                               Stefani Dama, M.D.    Merla Riches  D:  08/19/2002  T:  08/20/2002  Job:  [id number]  

## 2010-10-02 NOTE — Consult Note (Signed)
NAMEDANISH, RUFFINS NO.:  [id number]   MEDICAL RECORD NO.:  [id number]                   PATIENT TYPE:  INP   LOCATION:  2908                                 FACILITY:  [facility]   PHYSICIAN:  Pricilla Riffle, M.D. [facility]             DATE OF BIRTH:  March 31, 1951   DATE OF CONSULTATION:  08/16/2002  DATE OF DISCHARGE:                                   CONSULTATION   REFERRING PHYSICIAN:  Jeanmarie Plant.   REASON FOR CONSULTATION:  The patient is a 60 year old male whom we were  asked to see regarding chest pain.   HISTORY OF PRESENT ILLNESS:  The patient has a history of coronary artery  disease, status post MI in 1992 with PTCA of the RCA, status post  catheterization in 1996 with stent x3 to the RCA.  Last cardiac  catheterization showed multivessel disease with a 25% D1, 75% OM.   The patient was admitted yesterday for lower back surgery.  He notes prior  to surgery and admission, not being too active.  He has intermittent  episodes of chest pain, not sure if it is his reflux or his coronary  disease.   He had the surgery yesterday.  Today, he reports left-sided chest pain about  8:45 a.m.  He was given one nitroglycerin and Mylanta with no help.  Chest  pain is not pleuritic by his report, not changed by position.  Again, he is  not sure if it is GI or his heart.   The patient notes no radiation.   ALLERGIES:  ANTIBIOTICS question type, leaves a rash.   CURRENT MEDICATIONS:  1. Reglan.  2. Dolobid PCA  3. Norvasc 5.  4. Carafate q.a.c.  and h.s.  5. Lotensin 20.  6. Claritin 10.  7. Protonix 40.  8. Zocor 80.   PAST MEDICAL HISTORY:  1. CAD.  2. Hypertension.  3. Chronic tobacco use.  4. DJD.  5. History of reflux.  6. History of nephrolithiasis.  7. History of hyperlipidemia.   PAST SURGICAL HISTORY:  1. Carpal tunnel surgery.  2. Bilateral knee surgery.  3. Parathyroidectomy.   SOCIAL HISTORY:  The patient continues to smoke.   He has a 45-pack-year  history of smoking.  He drinks moderately.  He is married.   FAMILY HISTORY:  Noncontributory.   REVIEW OF SYSTEMS:  Overall, have reviewed.  Negative for __________  problem, except as noted.   PHYSICAL EXAMINATION:  GENERAL APPEARANCE:  The patient is in mild distress  secondary to pain.  VITAL SIGNS:  Blood pressure 126/80, respiratory rate 20, pulse 77,  temperature 100.2.  O2 saturation on two liters is 94%.  NECK:  JVP is normal.  LUNGS:  Relatively clear.  CARDIOVASCULAR:  Regular rate and rhythm with normal S1 and S2.  No S3 or  S4.  Grade 1/6 systolic murmur.  ABDOMEN:  Distended.  Positive bowel sounds.  Minimal tenderness.  Diffuse.  EXTREMITIES:  No lower extremity edema.   LABORATORY DATA:  Yesterday's lab showed a hemoglobin of 13.5, hematocrit  39.  BUN and creatinine were 18 and 0.9.   EKG shows normal sinus rhythm with no acute ST changes.   IMPRESSION:  The patient is a 60 year old gentleman with known coronary  artery disease, no postoperative day #1 for lumbar surgery.  He does  complain of chest pain concerning for angina, may also be gastrointestinal.  The patient has a history of reflux.  EKG is without acute changes.  Examination is relatively unremarkable except for a distended abdomen.   RECOMMENDATIONS:  Would rule out for myocardial infarction.  Would treat  with IV nitroglycerin, IV beta-blockade, check CK-MB, troponins, check CBC  and electrolytes.  Transfer to telemetry.  Will get a bedside echo.  Two  liters per nasal canula O2.  Will aggressively treat for gastrointestinal  with antireflux measures and stool softeners.   I have discussed with Dr. Franky Macho.  Given the recent surgery, he would  recommend holding anticoagulation for now unless there is more discrete  concrete evidence for ongoing ischemia.  Will closely monitor.                                               Pricilla Riffle, M.D. [facility]    PVR/MEDQ  D:   08/16/2002  T:  08/17/2002  Job:  [id number]

## 2010-10-02 NOTE — Op Note (Signed)
Robert Melton, Robert Melton NO.:  [id number]   MEDICAL RECORD NO.:  [id number]          PATIENT TYPE:  INP   LOCATION:  3112                         FACILITY:  [facility]   PHYSICIAN:  Coletta Memos, M.D.     DATE OF BIRTH:  03/15/1951   DATE OF PROCEDURE:  06/10/2006  DATE OF DISCHARGE:                               OPERATIVE REPORT   PREOPERATIVE DIAGNOSES:  1. L4-5 spondylolisthesis.  2. L4-5 stenosis.  3. L4-L5 radiculopathies.  4. Displaced disk, L2-3, left.   POSTOPERATIVE DIAGNOSES:  1. L4-5 spondylolisthesis.  2. L4-5 stenosis.  3. L4-L5 radiculopathies.  4. Displaced disk, L2-3, left.   PROCEDURE:  1. Posterior lumbar interbody arthrodesis, L4-5.  2. Lumbar decompression, L4-5.  3. Pedicle screw nonsegmental fixation, L4 to S1 on the right and L4      to L5 on the left.  4. Left L2-3 semi-hemilaminectomy and diskectomy with microdissection.   SURGEON:  Coletta Memos, M.D.   ASSISTANT:  Clydene Fake, M.D.   NURSING ASSISTANT:  Manon Hilding.   COMPLICATIONS:  None.   ANESTHESIA:  General endotracheal.   INDICATIONS:  Robert Melton is a 60 year old who underwent an L5-S1  arthrodesis and pedicle screw fixation in 2004.  He did well until  recently, when he started having severe pain in his back and especially  his left lower extremity.  He said he could barely lift his leg up on  the left side.  I therefore had him undergo an MRI, which showed  significant spondylolisthesis and facet arthropathy at L4-5 and he also  had a herniated disk at L2-3 on the left side.  I therefore recommended  and he agreed to undergo operative decompression, the arthrodesis and  PLIF and then and diskectomy, PLIF using 11-mm x 2 Synthes cages packed  with morselized autograft and Infuse placement into the disk space and  then a left L2-3 semi-hemilaminectomy with microdissection.   OPERATIVE NOTE:  Robert Melton was brought to the operating room.  He was  intubated and  placed under a general anesthetic.  A Foley catheter was  placed under sterile conditions.  After inducement, Robert Melton  pressure dropped and the anesthesiologist therefore placed a central  line and arterial line.  After being given fluids, his pressure  stabilized.  He is then rolled prone onto a Jackson table and all  pressure points were properly padded.  His back was prepped and he was  draped in a sterile fashion.  I opened his old incision and extended  that rostrally.  I then exposed the lamina of L2, L3, L4, L5 and the  sacrum.  I then exposed the pedicle screws which had been placed.  I  removed the caps and then removed the rod.  I then identified the L4  lamina and removed that.  I then decompressed the spinal canal at L4-5  using Kerrison punches and a high-speed drill.  After thorough  decompression of the thecal sac, which involved a great deal of  dissection due to scar tissue and due to very thick ligamentum flavum  which was loosely attached at some points to the dura, I then used  fluoroscopy because the patient was fishmouthed at this level.  So after  doing a diskectomy for the PLIF, I placed a paddle distractor and saw  that 11-mm fit quite well.  I then placed two 11-mm cages after  preparing the endplates for arthrodesis using a Synthes instrumentation.  I placed Infuse into the disk space anteriorly, then placed the cages,  which were packed with morselized autograft.  I then, with fluoroscopic  guidance and the assistance of Dr. Phoebe Perch, placed 2 pedicle screws, each  at L4.  I then removed the right-sided screw at L5, because due to its  positioned, it did not appear that I would be able to connect it to the  L4 screw.  However, the S1 screw on the right side and the L4 screw  where in good alignment.  The patient does have a solid fusion at L5-S1,  so I therefore connected the right-sided screws from L4 to S1.  On the  left side, I simply connected the L4 to  L5 screw and removed the S1  screw.  After doing that, the rods were secured.   I then turned my attention to semi-hemilaminectomy at L2-3 with  microdissection.  I performed a semi-hemilaminectomy using a high-speed  drill.  I removed the ligamentum flavum and exposed the thecal sac.  I  removed what was a very hard piece of tissue.  I am not sure what it  was, but it certainly was in the lateral gutter.  I also opened the disk  space and removed disk material on the left side and out in the neural  foramen.  After thorough decompression of the nerve root, I then  irrigated.  I then closed the entire wound in layered fashion using  Vicryl sutures.  I reapproximated the thoracolumbar fascia, the  subcutaneous tissue and the subcuticular layer.  I then used Steri-  Strips and placed a sterile dressing on the patient.  The patient was  then extubated, moving all extremities.           ______________________________  Coletta Memos, M.D.     KC/MEDQ  D:  06/10/2006  T:  06/11/2006  Job:  [id number]

## 2010-10-02 NOTE — Discharge Summary (Signed)
[name], Robert Melton NO.:  [id number]   MEDICAL RECORD NO.:  [id number]          PATIENT TYPE:  INP   LOCATION:  3006                         FACILITY:  [facility]   PHYSICIAN:  Coletta Memos, M.D.     DATE OF BIRTH:  03-14-1951   DATE OF ADMISSION:  06/10/2006  DATE OF DISCHARGE:  06/14/2006                               DISCHARGE SUMMARY   ADMITTING DIAGNOSIS:  1. Lumbar vertebrae-4/5 spondylolisthesis.  2. Lumbar vertebrae-4/5 stenosis.  3. Lumbar vertebrae-4/5 bilateral radiculopathy with displaced disc at      lumbar vertebrae-2/3 left.   DISCHARGE DIAGNOSES:  1. Lumbar vertebrae-4/5 spondylolisthesis.  2. Lumbar vertebrae-4/5 stenosis.  3. Lumbar vertebrae-4/5 bilateral radiculopathy with displaced disc at      lumbar vertebrae-2/3 left.   PROCEDURE:  Posterior lumbar interbody arthrodesis L4-5, lumbar  decompression L4-5, pedicle screw nonsegmental fixation L4-S1 on the  right, L4-L5 on the left, left L2-3 hemilaminectomy and diskectomy with  microdissection.   COMPLICATIONS:  None.   DISCHARGE STATUS:  Alive and well.   DISCHARGE MEDICATIONS:  Percocet, Dilaudid and Flexeril.  He is to take  either the Dilaudid or the Percocet but not both.   The patient is able to void, tolerate a regular diet without difficulty.  Strength is normal in the lower extremities.   [name] was admitted secondary to lumbar stenosis at L4-5.  He had  significant spondylolisthesis here secondary to incompetent facette  joints at L4-5.  He had a previous fusion at L5-S1.  That level was  fused so he therefore was taken back and had subsequent fusion of the L4  to L5.  Secondary to the screw head positions, I was not able to use a  middle screw on the right side nor the distal screw on the left, so he  has L4-L5 screws on the left.  He has L4-S1 screws on the right.  He  also had a disc herniation on the left side at L2-3 and I did a  diskectomy at the same time as the  other procedure.  He is doing well.  Postoperatively, he has done quite well without any difficulties.  He  will be discharged home with medications, voiding without difficulty.  He will contact the office and be given an appointment for 3-4 weeks.           ______________________________  Coletta Memos, M.D.     KC/MEDQ  D:  06/14/2006  T:  06/14/2006  Job:  [id number]

## 2010-10-02 NOTE — Op Note (Signed)
[place]. [facility]  Patient:    Robert, Melton Visit Number: [id number] MRN: [id number]          Service Type: SUR Location: 3000 3013 01 Attending Physician:  Coletta Memos Dictated by:   Mena Goes. Franky Macho, M.D. Proc. Date: 06/21/01 Admit Date:  06/21/2001 Discharge Date: 06/22/2001                             Operative Report  PREOPERATIVE DIAGNOSIS:  Right L5 radiculopathy.  POSTOPERATIVE DIAGNOSIS:  Right L5 radiculopathy.  PROCEDURE:  Right L5-S1 foraminotomy with semihemilaminectomy at L5 and microdissection.  COMPLICATIONS:  None.  SURGEON:  Kyle L. Franky Macho, M.D.  ASSISTANT:  Payton Doughty, M.D.  ANESTHESIA:  General endotracheal.  INDICATION:  Robert Melton is a 60 year old gentleman who had foraminal stenosis on MRI.  A selective L5 nerve block was performed on the right side, and he did have great relief from his pain.  I therefore recommended and he has agreed to undergo a right L5-S1 foraminotomy.  DESCRIPTION OF PROCEDURE:  Robert Melton was brought to the operating room, intubated, placed under general anesthesia without difficulty.  He was rolled prone onto the Wilson frame and all pressure points were properly padded.  His back was prepped, and he was draped in a sterile fashion.  I placed a spinal needle for localization so that I was pointed to the spinous process of L5. Using that as a guide, I made a skin incision with a #10 blade after infiltrating 17 cc of 0.5% lidocaine with 1:200,000 strength of epinephrine. I took this down to the thoracolumbar fascia and opened that with monopolar cautery, exposing the laminae of S1 and L5.  I took another x-ray to confirm I was at that L5-S1 space.  I then exposed the transverse process of L5 and the sacral ala, and I initially drilled out a portion of the pars extraforaminally; however, I did not think I would be able to achieve good decompression there, so I returned to an intraforaminal  approach and first removed the ligamentum flavum and then performed a semihemilaminectomy of L5 using a high-speed air drill on the right side.  I then used a microscope to aid in microdissection and identified first the disk space and then rostral to that the pedicle of L5 and subsequently the nerve root.  There was a significant amount of ligamentum flavum present.  Dr. Channing Mutters was assisting, and we were able to remove in a piecemeal fashion overlying pressure on the nerve root of L5.  After adequate decompression and as much as I felt could be safely done without removing the entire pars, thereby necessitating a fusion, I then proceeded to irrigate the wound.  I then closed the wound in layered fashion using Vicryl sutures.  The skin reapproximated with Vicryl sutures and Dermabond used for a sterile dressing. Dictated by:   Mena Goes. Franky Macho, M.D. Attending Physician:  Coletta Memos DD:  06/21/01 TD:  06/22/01 Job: [id number]

## 2010-10-02 NOTE — Op Note (Signed)
NAMEHANSEN, CARINO NO.:  [id number]   MEDICAL RECORD NO.:  [id number]                   PATIENT TYPE:  INP   LOCATION:  3172                                 FACILITY:  [facility]   PHYSICIAN:  Coletta Memos, M.D.                  DATE OF BIRTH:  [birth year]   DATE OF PROCEDURE:  08/15/2002  DATE OF DISCHARGE:                                 OPERATIVE REPORT   PREOPERATIVE DIAGNOSES:  1. Degenerative disk disease, L5-S1.  2. Spondylolisthesis, L5-S1.  3. Lumbar radiculopathy.   POSTOPERATIVE DIAGNOSES:  1. Degenerative disk disease, L5-S1.  2. Spondylolisthesis, L5-S1.  3. Lumbar radiculopathy.   PROCEDURES:  1. Posterior lumbar interbody fusion, L5-S1.  2. Interbody T-PLIF 11 mm, morcellized allograft and allograft.  3. Posterolateral arthrodesis with Synthes screws, 50 mm sacrum, 50 mm at L5     bilaterally.   SURGEON:  Coletta Memos, M.D.   ASSISTANT:  Stefani Dama, M.D.   ANESTHESIA:  General endotracheal.   INDICATIONS:  The patient is a 60 year old gentleman who presented to me two  years ago, and I actually saw him at that time for right lower extremity  pain.  I therefore took him to the operating room in February 2003 for right  L5-S1 foraminotomy and semihemilaminectomy at L5.  He did well  postoperatively until about two months ago.  At that time he developed pain  again in the lower extremity.  I therefore recommended and he agreed to  undergo an L5-S1 arthrodesis.   DESCRIPTION OF PROCEDURE:  The patient was brought to the operating room,  intubated, placed under general anesthesia without difficulty.  He was  rolled prone onto body rolls and all pressure points were properly padded.  Prior to that his Foley catheter had been placed under sterile conditions.  His back was prepped and he was draped in a sterile fashion.  We infiltrated  19 mL of 0.5% lidocaine and 1:200,000 strength epinephrine into the  paraspinous  musculature on the right and left sides and into his old  incision site.  Lengthening the old incision, I opened with a #10 blade and  took this down to the thoracolumbar fascia.  I then exposed the lamina of  L4, L5, and of the sacrum.  I performed a Gill procedure to decompress the  L5 nerve roots bilaterally.  At that time there was a dural opening which  was made in exposing the lamina.  I therefore closed that primarily using 4-  0 Nurolon sutures and a small piece of muscle overlying the defect.  Multiple Valsalva showed that it was a watertight closure.  I then turned my  attention to the disk space and opened up the disk space on the left side  and performed a diskectomy using pituitary rongeurs, curettes, and special  bone tools provided in the  Synthes T-PLIF set.  After preparing the disk  space, I then placed pedicle screws with the assistance of Dr. Barnett Abu.  Using fluoroscopic guidance, screws were placed at L5 and S1, first by  drilling, then tapping, then placing the screw.  This was done without  difficulty, and the screws were all positioned.  I then distracted the L4  and sacrum and placed an 11 mm graft into the disk space then without  difficulty.  Bone, morcellized autograft and allograft, was then placed into  the disk space above the bone.  Lateral lumbar films showed that the bone  was in good position.  I then roughed up the transverse processes on both  sides and laid some bone down across that area.  I placed the rods and put  the construct in slight compression.  The rods were then secured using break-  off screws.  When that was done, an AP film was taken and showed all the  screws were in good position in the AP and lateral views.  Then irrigated  the wound.  We then closed the wound in a layered fashion using Vicryl  sutures.   ADDENDUM:  Gill procedure was performed as part of the operation.                                                 Coletta Memos, M.D.    KC/MEDQ  D:  08/15/2002  T:  08/15/2002  Job:  [id number]

## 2010-10-02 NOTE — H&P (Signed)
[place]. [facility]  Patient:    Robert Melton, Robert Melton Visit Number: [id number] MRN: [id number]          Service Type: SUR Location: 3000 3013 01 Attending Physician:  Coletta Memos Dictated by:   Mena Goes. Franky Macho, M.D. Admit Date:  06/21/2001                           History and Physical  ADMITTING DIAGNOSIS: Right L5 radiculopathy.  HISTORY OF PRESENT ILLNESS: Robert Melton is a 60 year old gentleman, currently disabled due to a host of medical problems.  He is right-handed.  He has had pain in his back and right lower extremity for approximately one year.  Pain is mainly in the right lower extremity.  He has had no bowel or bladder difficulties.  No tingling associated with this.   He also has bilateral pain which goes across his hips.  The pain has not been relieved by pills or conservative treatment.  PAST MEDICAL HISTORY:  1. Heart disease.  2. Gastroesophageal reflux disease.  3. Skin tumor.  4. Carpal tunnel syndrome.  5. Cervical spondylosis.  PAST SURGICAL HISTORY:  1. Carpal tunnel surgery on both the right and left side.  2. Skin cancer removed from face.  3. Left knee surgery.  4. Right knee surgery.  5. Parathyroid gland removal.  6. Cervical disk fusion in 1999.  7. Angioplasty, cardiac stent placement and cardiac catheterization.  ALLERGIES: No known drug allergies.  SOCIAL HISTORY: Smokes 1-1/2 packs of cigarettes a day since he was 82.  He does not drink alcohol, now does he use illicit drugs.  REVIEW OF SYSTEMS: Positive for night sweats, otherwise has hypercholesterolemia, leg pain, chronic lower back pain, arthritis, bleeding, problems in his arms.  He denies ear, nose, throat, mouth, gastrointestinal, skin, neurologic, psychiatric, endocrine, or allergic problems.  MEDICATIONS:  1. Lotrel 5/20 mg q.d.  2. Bextra 10 mg b.i.d.  3. Lipitor 20 mg q.d.  4. Nexium 40 mg q.d.  5. Quinine sulfate.  PHYSICAL EXAMINATION:  VITAL  SIGNS: Height 5 feet 10 inches.  Weight 190 pounds.  Pulse 72.  NEUROLOGIC: He is alert and oriented x4 and answers questions appropriately. Memory, language, attention span, and fund of knowledge are normal.  He is well-kempt, and in mild distress.  Mildly antalgic gait, favors the right lower extremity.  Normal muscle tone.  He can toe walk, heel walk, and do deep knee bends.  No pain with straight-leg-raise bilaterally.  Intact proprioception and pinprick.  Downgoing toes to plantar stimulation.  No clonus.  NECK: No cervical masses or bruits.  CHEST: Lung fields are clear.  HEART: Regular rhythm and rate.  No murmurs or rubs.  EXTREMITIES: Pulses good at wrists and feet bilaterally.  LABORATORY DATA: MRI showed degenerative disk throughout the lumbar spine at L5-S1, 4-5, 3-4, with foraminal stenosis bilaterally at L5-S1 - not present at other levels.  He does not have a disk herniation and no central canal compression.  IMPRESSION/PLAN: Robert Melton received a nerve block at L5 and reported good relief of the pain.  At the end of the case I offered and he agreed to undergo right L5-S1 foraminotomy.  Risks of the procedure including bleeding, infection, no pain relief, need for further surgery were discussed.  He understands and wishes to proceed. Dictated by:   Mena Goes. Franky Macho, M.D. Attending Physician:  Coletta Memos DD:  06/21/01 TD:  06/22/01 Job: [id number]

## 2010-12-08 DIAGNOSIS — I10 Essential (primary) hypertension: Secondary | ICD-10-CM | POA: Insufficient documentation

## 2010-12-08 DIAGNOSIS — E785 Hyperlipidemia, unspecified: Secondary | ICD-10-CM | POA: Insufficient documentation

## 2011-02-15 LAB — CBC
HCT: 24.9 — ABNORMAL LOW
HCT: 25.1 — ABNORMAL LOW
HCT: 28.3 — ABNORMAL LOW
HCT: 28.6 — ABNORMAL LOW
HCT: 33.2 — ABNORMAL LOW
HCT: 37.7 — ABNORMAL LOW
HCT: 37.8 — ABNORMAL LOW
HCT: 38 — ABNORMAL LOW
HCT: 40.7
HCT: 41.3
Hemoglobin: 11.3 — ABNORMAL LOW
Hemoglobin: 12.7 — ABNORMAL LOW
Hemoglobin: 12.9 — ABNORMAL LOW
Hemoglobin: 12.9 — ABNORMAL LOW
Hemoglobin: 13.3
Hemoglobin: 13.7
Hemoglobin: 13.9
Hemoglobin: 8.7 — ABNORMAL LOW
Hemoglobin: 9.6 — ABNORMAL LOW
Hemoglobin: 9.7 — ABNORMAL LOW
Hemoglobin: 9.8 — ABNORMAL LOW
MCHC: 33.7
MCHC: 33.9
MCHC: 34.1
MCHC: 34.2
MCHC: 34.3
MCHC: 34.4
MCV: 92.7
MCV: 92.8
MCV: 93.1
MCV: 93.2
MCV: 93.3
MCV: 93.6
MCV: 93.9
MCV: 94
MCV: 94.3
MCV: 94.7
MCV: 94.9
Platelets: 118 — ABNORMAL LOW
Platelets: 122 — ABNORMAL LOW
Platelets: 126 — ABNORMAL LOW
Platelets: 185
RBC: 3.01 — ABNORMAL LOW
RBC: 3.03 — ABNORMAL LOW
RBC: 3.07 — ABNORMAL LOW
RBC: 3.1 — ABNORMAL LOW
RBC: 3.98 — ABNORMAL LOW
RBC: 4.01 — ABNORMAL LOW
RBC: 4.06 — ABNORMAL LOW
RBC: 4.16 — ABNORMAL LOW
RBC: 4.33
RBC: 4.4
RDW: 13.4
RDW: 13.4
RDW: 13.5
WBC: 10.6 — ABNORMAL HIGH
WBC: 6.8
WBC: 7.3
WBC: 7.4
WBC: 7.8
WBC: 7.9
WBC: 8.8
WBC: 8.8
WBC: 9.5

## 2011-02-15 LAB — POCT I-STAT 4, (NA,K, GLUC, HGB,HCT)
Glucose, Bld: 103 — ABNORMAL HIGH
Glucose, Bld: 106 — ABNORMAL HIGH
Glucose, Bld: 93
Glucose, Bld: 98
HCT: 33 — ABNORMAL LOW
HCT: 33 — ABNORMAL LOW
HCT: 36 — ABNORMAL LOW
Hemoglobin: 11.2 — ABNORMAL LOW
Hemoglobin: 11.6 — ABNORMAL LOW
Hemoglobin: 12.2 — ABNORMAL LOW
Potassium: 3.9
Sodium: 140
Sodium: 140
Sodium: 140
Sodium: 140
Sodium: 141
Sodium: 143

## 2011-02-15 LAB — TYPE AND SCREEN: Antibody Screen: NEGATIVE

## 2011-02-15 LAB — MAGNESIUM
Magnesium: 2
Magnesium: 2.1
Magnesium: 2.2

## 2011-02-15 LAB — POCT I-STAT 3, ART BLOOD GAS (G3+)
Acid-base deficit: 1
Acid-base deficit: 1
Acid-base deficit: 3 — ABNORMAL HIGH
Acid-base deficit: 5 — ABNORMAL HIGH
Bicarbonate: 20.5
Bicarbonate: 23.5
Bicarbonate: 23.9
O2 Saturation: 100
O2 Saturation: 100
O2 Saturation: 100
O2 Saturation: 94
Patient temperature: 34.9
TCO2: 22
TCO2: 25
TCO2: 25
pCO2 arterial: 39
pCO2 arterial: 40.5
pCO2 arterial: 40.7
pH, Arterial: 7.37
pO2, Arterial: 202 — ABNORMAL HIGH
pO2, Arterial: 80
pO2, Arterial: 96

## 2011-02-15 LAB — BASIC METABOLIC PANEL
BUN: 12
BUN: 13
BUN: 14
BUN: 24 — ABNORMAL HIGH
CO2: 22
CO2: 26
Calcium: 8.6
Calcium: 8.9
Chloride: 102
Chloride: 102
Chloride: 106
Chloride: 107
Chloride: 108
Creatinine, Ser: 0.86
Creatinine, Ser: 0.89
Creatinine, Ser: 0.92
Creatinine, Ser: 0.93
GFR calc Af Amer: >60
GFR calc Af Amer: >60
GFR calc Af Amer: >60
GFR calc non Af Amer: >60
GFR calc non Af Amer: >60
Glucose, Bld: 101 — ABNORMAL HIGH
Glucose, Bld: 104 — ABNORMAL HIGH
Glucose, Bld: 89
Potassium: 3.7
Potassium: 4.1
Potassium: 4.3
Sodium: 133 — ABNORMAL LOW
Sodium: 135

## 2011-02-15 LAB — GLUCOSE, CAPILLARY
Glucose-Capillary: 101 — ABNORMAL HIGH
Glucose-Capillary: 107 — ABNORMAL HIGH
Glucose-Capillary: 114 — ABNORMAL HIGH
Glucose-Capillary: 123 — ABNORMAL HIGH
Glucose-Capillary: 128 — ABNORMAL HIGH
Glucose-Capillary: 129 — ABNORMAL HIGH
Glucose-Capillary: 142 — ABNORMAL HIGH

## 2011-02-15 LAB — URINALYSIS, ROUTINE W REFLEX MICROSCOPIC
Bilirubin Urine: NEGATIVE
Glucose, UA: NEGATIVE
Glucose, UA: NEGATIVE
Hgb urine dipstick: NEGATIVE
Hgb urine dipstick: NEGATIVE
Protein, ur: NEGATIVE
Specific Gravity, Urine: 1.012
pH: 6
pH: 7

## 2011-02-15 LAB — POCT I-STAT, CHEM 8
BUN: 16
Chloride: 104
Creatinine, Ser: 1.2
Glucose, Bld: 109 — ABNORMAL HIGH
Potassium: 4.3
Sodium: 136

## 2011-02-15 LAB — DIFFERENTIAL
Basophils Absolute: 0.1
Basophils Relative: 1
Eosinophils Absolute: 0.3
Neutro Abs: 4.5
Neutrophils Relative %: 62

## 2011-02-15 LAB — COMPREHENSIVE METABOLIC PANEL
ALT: 18
AST: 13
Albumin: 3.5
Alkaline Phosphatase: 55
Alkaline Phosphatase: 75
BUN: 13
BUN: 9
CO2: 25
CO2: 26
Chloride: 109
Chloride: 109
GFR calc non Af Amer: >60
Glucose, Bld: 100 — ABNORMAL HIGH
Potassium: 3.9
Potassium: 4.3
Sodium: 140
Total Bilirubin: 0.6
Total Bilirubin: 0.7

## 2011-02-15 LAB — CK TOTAL AND CKMB (NOT AT ARMC)
CK, MB: 1.6
Total CK: 76

## 2011-02-15 LAB — APTT: aPTT: 25

## 2011-02-15 LAB — BLOOD GAS, ARTERIAL
Bicarbonate: 23.8
FIO2: 0.21
O2 Saturation: 96.2
Patient temperature: 97.7
TCO2: 25.1

## 2011-02-15 LAB — HEPARIN LEVEL (UNFRACTIONATED): Heparin Unfractionated: 0.73 — ABNORMAL HIGH

## 2011-02-15 LAB — CREATININE, SERUM: GFR calc non Af Amer: >60

## 2011-02-15 LAB — HEMOGLOBIN A1C: Mean Plasma Glucose: 114

## 2011-02-15 LAB — LIPASE, BLOOD: Lipase: 17

## 2011-02-15 LAB — PROTIME-INR: INR: 1

## 2011-02-15 LAB — POCT CARDIAC MARKERS
CKMB, poc: <1 — ABNORMAL LOW
Troponin i, poc: <0.05

## 2012-11-07 DIAGNOSIS — I2510 Atherosclerotic heart disease of native coronary artery without angina pectoris: Secondary | ICD-10-CM | POA: Insufficient documentation

## 2012-11-21 ENCOUNTER — Encounter: Payer: Self-pay | Admitting: Internal Medicine

## 2012-11-23 ENCOUNTER — Encounter: Payer: Self-pay | Admitting: *Deleted

## 2012-11-24 ENCOUNTER — Ambulatory Visit (INDEPENDENT_AMBULATORY_CARE_PROVIDER_SITE_OTHER): Payer: Medicare Other | Admitting: Cardiovascular Disease

## 2012-11-24 ENCOUNTER — Encounter: Payer: Self-pay | Admitting: Cardiovascular Disease

## 2012-11-24 VITALS — BP 155/88 | HR 71 | Ht 63.0 in | Wt 185.3 lb

## 2012-11-24 DIAGNOSIS — Z720 Tobacco use: Secondary | ICD-10-CM | POA: Insufficient documentation

## 2012-11-24 DIAGNOSIS — I2510 Atherosclerotic heart disease of native coronary artery without angina pectoris: Secondary | ICD-10-CM

## 2012-11-24 DIAGNOSIS — I252 Old myocardial infarction: Secondary | ICD-10-CM

## 2012-11-24 DIAGNOSIS — F17200 Nicotine dependence, unspecified, uncomplicated: Secondary | ICD-10-CM

## 2012-11-24 DIAGNOSIS — R079 Chest pain, unspecified: Secondary | ICD-10-CM

## 2012-11-24 DIAGNOSIS — E669 Obesity, unspecified: Secondary | ICD-10-CM

## 2012-11-24 DIAGNOSIS — Z01818 Encounter for other preprocedural examination: Secondary | ICD-10-CM

## 2012-11-24 DIAGNOSIS — I10 Essential (primary) hypertension: Secondary | ICD-10-CM | POA: Insufficient documentation

## 2012-11-24 DIAGNOSIS — R5383 Other fatigue: Secondary | ICD-10-CM

## 2012-11-24 DIAGNOSIS — R5381 Other malaise: Secondary | ICD-10-CM

## 2012-11-24 DIAGNOSIS — E785 Hyperlipidemia, unspecified: Secondary | ICD-10-CM | POA: Insufficient documentation

## 2012-11-24 DIAGNOSIS — K219 Gastro-esophageal reflux disease without esophagitis: Secondary | ICD-10-CM

## 2012-11-24 DIAGNOSIS — I25110 Atherosclerotic heart disease of native coronary artery with unstable angina pectoris: Secondary | ICD-10-CM | POA: Insufficient documentation

## 2012-11-24 DIAGNOSIS — Z79899 Other long term (current) drug therapy: Secondary | ICD-10-CM

## 2012-11-24 DIAGNOSIS — D689 Coagulation defect, unspecified: Secondary | ICD-10-CM

## 2012-11-24 MED ORDER — METOPROLOL TARTRATE 25 MG PO TABS: 25.0000 mg | ORAL_TABLET | Freq: Two times a day (BID) | ORAL | Status: DC

## 2012-11-24 MED ORDER — ISOSORBIDE MONONITRATE ER 60 MG PO TB24: 60.0000 mg | ORAL_TABLET | Freq: Every day | ORAL | Status: DC

## 2012-11-24 NOTE — Progress Notes (Signed)
Patient ID: Robert Melton, male   DOB: 10/17/1950, 61 y.o.   MRN: [id number]     PATIENT PROFILE: [name] is a 61-year-old was established coronary artery disease who represents to the office today after 3 years with a chief complaint of recurrent episodes of increasing chest pain.   HPI: [name] suffered a prior myocardial infarction treated with PTCA at [facility] in 1993 and stenting. In October 2009 he developed an acute coronary syndrome and cardiac catheterization revealed 95% ostial LAD stenosis, subtotal 99% circumflex stenosis, and mild to moderate residual narrowing in his right carotid artery. He underwent CABG surgery and Dr. Hendrickson the LIMA to the LAD, free RIMA graft to the OM1, and sequential vein graft to the PDA and PLA vessels. Additional problems have included hypertension, hyperlipidemia, low HDL levels. In the past, he had been on combination therapy with Niaspan plus Crestor to apparently, he developed recurrent chest pain in June 2011 and repeat catheterization revealed a 99% stenosis at the anastomosis of the vein graft to the distal right coronary artery. He had a patent LIMA graft to the LAD and a patent free RIMA graft to the obtuse marginal vessel. There was an occluded sequential limb which previously supplied the PL vessel. Underwent successful PTCA of the 99% stenosis to less than 20%.  [name] has not been seen in our office in 3 years. He states his insurance had changed the constantly he was not followed by any cardiologist. In addition, his primary physician in [place] is no longer practice. Proximally it 6 weeks ago, he ran out of most of his medications for one week. At that time he establish with a Dr. James Melton in [place] [facility] was started back on his medications per however, with the past month he has experienced recurrent episodes of chest tightness and pressure. Recently, these have increased in intensity and frequency related  experience a more significant episode last night. He presents to the office today for cardiology evaluation.  He denies presyncope or syncope. He admits to decreased energy. He has not been very active due to hip discomfort but when he is active he does note some shortness of breath.  Past Medical History  Diagnosis Date  . CAD (coronary artery disease)   . History of MI (myocardial infarction) 1993    PTCA at [facility] with stenting  . Hypertension   . Hyperlipidemia   . GERD (gastroesophageal reflux disease)   . Broken neck 2008    fall from ladder  . History of echocardiogram 10/29/2009    EF 50-55%; LV systolic function normal; mildly sclerotic AV  . History of nuclear stress test 08/21/2008    dipyridamole; negative for ischemia; normal study   . History of Doppler ultrasound 04/13/2006    LEAs; bilat ABIs - no evidence of arterial insuff.; bilat PVRs - normal; irregular non-hemodynamically significant plaque bilat in SFA    Past Surgical History  Procedure Laterality Date  . Coronary artery bypass graft  2009    revascularization by Dr. Henderson; LIMA to LAD, free RIMA graft to OM1, sequential vein gradt to PDA & PL segment  . Cardiac catheterization  10/24/2009    r/t CP; significant native CAD, patent LIMA graft to LAD & patent RIMA to obtuse marginal vessel; graft to RCA 99% stenosed at anastomses; stenting to RCA reduced to 20-30%  . Lumbar spine surgery  2004    s/p fusion L4-S1; 4 back surgeries  .   Parathyroidectomy  2001  . Knee surgery      Left Knee (1992), Right Knee (1998);   . Carpal tunnel release  2000  . Neck fusion  1999  . Skin cancer excision  1999    face    No Known Allergies  Current Outpatient Prescriptions  Medication Sig Dispense Refill  . aspirin 325 MG tablet Take 325 mg by mouth daily.      . atorvastatin (LIPITOR) 40 MG tablet Take 40 mg by mouth daily.      . Ferrous Sulfate (IRON) 325 (65 FE) MG TABS Take 1 capsule by mouth daily.      .  lisinopril (PRINIVIL,ZESTRIL) 20 MG tablet Take 20 mg by mouth daily.      . loratadine (CLARITIN) 10 MG tablet Take 10 mg by mouth daily as needed for allergies.      . naproxen sodium (ANAPROX) 220 MG tablet Take 220 mg by mouth 2 (two) times daily with a meal.      . nitroGLYCERIN (NITROSTAT) 0.4 MG SL tablet Place 0.4 mg under the tongue every 5 (five) minutes as needed for chest pain.      . pantoprazole (PROTONIX) 40 MG tablet Take 40 mg by mouth daily.      . senna-docusate (SENOKOT-S) 8.6-50 MG per tablet Take 1 tablet by mouth daily.      . isosorbide mononitrate (IMDUR) 60 MG 24 hr tablet Take 1 tablet (60 mg total) by mouth daily.  90 tablet  3  . metoprolol tartrate (LOPRESSOR) 25 MG tablet Take 1 tablet (25 mg total) by mouth 2 (two) times daily.  180 tablet  3   No current facility-administered medications for this visit.    Social history is notable that he is married for 17 years. He has 4 children 10 grandchildren. He lives at home with his wife and 4 grandchildren he completed 12 grades of education. He is disabled he still smokes 4 cigarettes per day per does not drink alcohol.  Family History  Problem Relation Age of Onset  . Stroke Father   . Heart disease Father   . Heart disease Mother   . Heart attack Mother   . Hyperlipidemia Mother   . Hyperlipidemia Sister     ROS is negative for fever chills or night sweats he denies skin changes per he denies visual changes. He denies wheezing. He denies pre-syncope or syncope. He does have some mild shortness of breath. The episodes of chest pain that he has experienced recently are very similar to the chest pain that he experienced prior to his last intervention which was done in June 2011. He denies bleeding. Denies worsening GERD. He denies GU symptoms. He denies significant edema. He denies myalgias. 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 He denies psychiatric changes. He denies paresthesias. There are no seizures.  Other system review is negative.  PE BP 155/88  Pulse 71  Ht 5' 3" (1.6 m)  Wt 185 lb 4.8 oz (84.052 kg)  BMI 32.83 kg/m2 General: Alert, oriented, no distress.  Skin: normal turgor, no rashes HEENT: Normocephalic, atraumatic. Pupils round and reactive; sclera anicteric; Fundi mild arteriolar narrowing.   Nose without nasal septal hypertrophy Mouth/Parynx benign; Mallinpatti scale 3. He does have upper dentures. Neck: No JVD, no carotid briuts Lungs: clear to ausculatation and percussion; no wheezing or rales Heart: RRR, s1 s2 normal 2/6 systolic murmur in the aortic region. No S3 Abdomen: Mildly obese. soft, nontender; no hepatosplenomehaly,   BS+; abdominal aorta nontender and not dilated by palpation. Pulses 2+ Extremities: no clubbinbg cyanosis or edema, Homan's sign negative  Neurologic: grossly nonfocal    ECG: Normal sinus rhythm at 71 beats per minute the nondiagnostic T-wave changes V5 and V6.  LABS:  BMET    Component Value Date/Time   NA 140 10/25/2009 0535   K 4.3 10/25/2009 0535   CL 109 10/25/2009 0535   CO2 24 10/25/2009 0535   GLUCOSE 102* 10/25/2009 0535   BUN 13 10/25/2009 0535   CREATININE 0.91 10/25/2009 0535   CALCIUM 8.7 10/25/2009 0535   GFRNONAA >60 10/25/2009 0535   GFRAA  Value: >60        The eGFR has been calculated using the MDRD equation. This calculation has not been validated in all clinical situations. eGFR's persistently <60 mL/min signify possible Chronic Kidney Disease. 10/25/2009 0535     Hepatic Function Panel     Component Value Date/Time   PROT 5.9* 03/02/2008 0349   ALBUMIN 3.5 03/02/2008 0349   AST 13 03/02/2008 0349   ALT 14 03/02/2008 0349   ALKPHOS 55 03/02/2008 0349   BILITOT 0.6 03/02/2008 0349     CBC    Component Value Date/Time   WBC 6.6 10/25/2009 0535   RBC 3.96* 10/25/2009 0535   HGB 12.7* 10/25/2009 0535   HCT 36.6* 10/25/2009 0535   PLT 159 10/25/2009 0535   MCV 92.3 10/25/2009 0535   MCHC 34.7 10/25/2009 0535   RDW  13.0 10/25/2009 0535   LYMPHSABS 2.0 02/29/2008 1215   MONOABS 0.4 02/29/2008 1215   EOSABS 0.3 02/29/2008 1215   BASOSABS 0.1 02/29/2008 1215     BNP No results found for this basename: probnp    Lipid Panel  No results found for this basename: chol, trig, hdl, cholhdl, vldl, ldlcalc     RADIOLOGY: No results found.   ASSESSMENT AND PLAN: [name] is a 61-year-old gentleman who suffered a myocardial infarction in 1993 and underwent stenting of his right coronary artery at that time. In December 2009 he developed an acute coronary syndrome and underwent CABG surgery. In June 2011 he was found to have 99% stenosis at the anastomosis of the graft supplying the PDA and the limb supplying the PLA vessel was occluded. He underwent successful intervention at this site with PTCA and subsequently felt improved. He has not been seen in 3 years. He now experiences recurrent episodes of chest pain worrisome for class III angina. Of note, he is not on a beta blocker as well as amlodipine medications which he was on 3 years ago. I did discuss with him increased medical therapy and shadow G. with nuclear imaging versus cardiac catheterization and his preference is to pursue definitive diagnostic cardiac catheterization. I am starting him on isosorbide mononitrate 30 mg for the next 2 days and then he'll increase this to 60 mg. I'm also starting him on Lopressor 25 mg twice a day. I'm scheduling him for a 2-D echo Doppler study to evaluate systolic and diastolic function as well as to further evaluate his aortic valve the light of his murmur. Report will be obtained. Cardiac catheterization will tentatively be scheduled next week at [facility]. I discussed risks benefits of the procedure and the patient wishes to proceed.   Cristhian Vanhook A. Michale Weikel, MD, FACC 11/24/2012 6:15 PM 

## 2012-11-24 NOTE — Patient Instructions (Addendum)
Your physician recommends that you return for lab work for pre op Surgery on Monday morning you need to be fasting for this blood-work nothing to eat or just after 12 mid-night  A chest x-ray takes a picture of the organs and structures inside the chest, including the heart, lungs, and blood vessels. This test can show several things, including, whether the heart is enlarges; whether fluid is building up in the lungs; and whether pacemaker / defibrillator leads are still in place. On Monday   Your physician has requested that you have a cardiac catheterization. Cardiac catheterization is used to diagnose and/or treat various heart conditions. Doctors may recommend this procedure for a number of different reasons. The most common reason is to evaluate chest pain. Chest pain can be a symptom of coronary artery disease (CAD), and cardiac catheterization can show whether plaque is narrowing or blocking your heart's arteries. This procedure is also used to evaluate the valves, as well as measure the blood flow and oxygen levels in different parts of your heart. For further information please visit [email or web address] Please follow instruction sheet, as given.  Your physician has recommended you make the following change in your medication: start Isosorbide 60 mg, take 1/2 tablet (30 mg) tonight and [place] and start 60 mg on Sunday also start metoprolol 25 mg daily.  Your physician has requested that you have an echocardiogram. Echocardiography is a painless test that uses sound waves to create images of your heart. It provides your doctor with information about the size and shape of your heart and how well your heart's chambers and valves are working. This procedure takes approximately one hour. There are no restrictions for this procedure.

## 2012-11-27 ENCOUNTER — Encounter: Payer: Self-pay | Admitting: Cardiovascular Disease

## 2012-11-27 ENCOUNTER — Ambulatory Visit
Admission: RE | Admit: 2012-11-27 | Discharge: 2012-11-27 | Disposition: A | Discharge status: Home / Self Care | Payer: Medicare Other | Source: Ambulatory Visit | Attending: Cardiovascular Disease | Admitting: Cardiovascular Disease

## 2012-11-27 ENCOUNTER — Ambulatory Visit (HOSPITAL_COMMUNITY)
Admission: RE | Admit: 2012-11-27 | Discharge: 2012-11-27 | Disposition: A | Discharge status: Home / Self Care | Payer: Medicare Other | Source: Ambulatory Visit | Attending: Cardiovascular Disease | Admitting: Cardiovascular Disease

## 2012-11-27 DIAGNOSIS — I379 Nonrheumatic pulmonary valve disorder, unspecified: Secondary | ICD-10-CM | POA: Insufficient documentation

## 2012-11-27 DIAGNOSIS — I079 Rheumatic tricuspid valve disease, unspecified: Secondary | ICD-10-CM | POA: Insufficient documentation

## 2012-11-27 DIAGNOSIS — I2510 Atherosclerotic heart disease of native coronary artery without angina pectoris: Secondary | ICD-10-CM | POA: Insufficient documentation

## 2012-11-27 DIAGNOSIS — E785 Hyperlipidemia, unspecified: Secondary | ICD-10-CM | POA: Insufficient documentation

## 2012-11-27 DIAGNOSIS — Z01818 Encounter for other preprocedural examination: Secondary | ICD-10-CM

## 2012-11-27 DIAGNOSIS — R079 Chest pain, unspecified: Secondary | ICD-10-CM

## 2012-11-27 DIAGNOSIS — I10 Essential (primary) hypertension: Secondary | ICD-10-CM | POA: Insufficient documentation

## 2012-11-27 DIAGNOSIS — F17200 Nicotine dependence, unspecified, uncomplicated: Secondary | ICD-10-CM | POA: Insufficient documentation

## 2012-11-27 DIAGNOSIS — I059 Rheumatic mitral valve disease, unspecified: Secondary | ICD-10-CM | POA: Insufficient documentation

## 2012-11-27 LAB — CBC
MCV: 88.3 fL (ref 78.0–100.0)
Platelets: 182 10*3/uL (ref 150–400)
RDW: 14.3 % (ref 11.5–15.5)
WBC: 7 10*3/uL (ref 4.0–10.5)

## 2012-11-27 LAB — COMPREHENSIVE METABOLIC PANEL
ALT: 15 U/L (ref 0–53)
CO2: 25 mEq/L (ref 19–32)
Calcium: 9.1 mg/dL (ref 8.4–10.5)
Chloride: 109 mEq/L (ref 96–112)
Potassium: 3.8 mEq/L (ref 3.5–5.3)
Sodium: 141 mEq/L (ref 135–145)
Total Bilirubin: 0.5 mg/dL (ref 0.3–1.2)
Total Protein: 6 g/dL (ref 6.0–8.3)

## 2012-11-27 LAB — LIPID PANEL: Total CHOL/HDL Ratio: 5.1 Ratio

## 2012-11-27 LAB — APTT: aPTT: 28 seconds (ref 24–37)

## 2012-11-27 NOTE — Addendum Note (Signed)
Addended by: Barrie Dunker on: 11/27/2012 08:41 AM   Modules accepted: Orders

## 2012-11-27 NOTE — Progress Notes (Signed)
2D Echo Performed 11/27/2012    Orphia Mctigue, RCS  

## 2012-11-28 ENCOUNTER — Other Ambulatory Visit: Payer: Self-pay | Admitting: *Deleted

## 2012-11-28 ENCOUNTER — Encounter (HOSPITAL_COMMUNITY): Payer: Self-pay | Admitting: Pharmacy Technician

## 2012-11-28 DIAGNOSIS — Z01818 Encounter for other preprocedural examination: Secondary | ICD-10-CM

## 2012-11-28 LAB — URINALYSIS
Glucose, UA: NEGATIVE mg/dL
Leukocytes, UA: NEGATIVE
Nitrite: NEGATIVE
Protein, ur: NEGATIVE mg/dL
Urobilinogen, UA: 1 mg/dL (ref 0.0–1.0)

## 2012-11-28 LAB — TSH: TSH: 2.126 u[IU]/mL (ref 0.350–4.500)

## 2012-11-29 ENCOUNTER — Ambulatory Visit (HOSPITAL_COMMUNITY)
Admission: RE | Admit: 2012-11-29 | Discharge: 2012-11-29 | Disposition: A | Discharge status: Home / Self Care | Payer: Medicare Other | Source: Ambulatory Visit | Attending: Cardiovascular Disease | Admitting: Cardiovascular Disease

## 2012-11-29 ENCOUNTER — Other Ambulatory Visit: Payer: Self-pay | Admitting: *Deleted

## 2012-11-29 ENCOUNTER — Encounter (HOSPITAL_COMMUNITY)
Admission: RE | Disposition: A | Discharge status: Home / Self Care | Payer: Self-pay | Source: Ambulatory Visit | Attending: Cardiovascular Disease

## 2012-11-29 DIAGNOSIS — Z791 Long term (current) use of non-steroidal anti-inflammatories (NSAID): Secondary | ICD-10-CM | POA: Insufficient documentation

## 2012-11-29 DIAGNOSIS — I10 Essential (primary) hypertension: Secondary | ICD-10-CM | POA: Insufficient documentation

## 2012-11-29 DIAGNOSIS — I2582 Chronic total occlusion of coronary artery: Secondary | ICD-10-CM | POA: Insufficient documentation

## 2012-11-29 DIAGNOSIS — Z951 Presence of aortocoronary bypass graft: Secondary | ICD-10-CM | POA: Insufficient documentation

## 2012-11-29 DIAGNOSIS — F17200 Nicotine dependence, unspecified, uncomplicated: Secondary | ICD-10-CM | POA: Insufficient documentation

## 2012-11-29 DIAGNOSIS — E785 Hyperlipidemia, unspecified: Secondary | ICD-10-CM | POA: Insufficient documentation

## 2012-11-29 DIAGNOSIS — Z01818 Encounter for other preprocedural examination: Secondary | ICD-10-CM

## 2012-11-29 DIAGNOSIS — I252 Old myocardial infarction: Secondary | ICD-10-CM | POA: Insufficient documentation

## 2012-11-29 DIAGNOSIS — Z85828 Personal history of other malignant neoplasm of skin: Secondary | ICD-10-CM | POA: Insufficient documentation

## 2012-11-29 DIAGNOSIS — Z7982 Long term (current) use of aspirin: Secondary | ICD-10-CM | POA: Insufficient documentation

## 2012-11-29 DIAGNOSIS — R079 Chest pain, unspecified: Secondary | ICD-10-CM

## 2012-11-29 DIAGNOSIS — R011 Cardiac murmur, unspecified: Secondary | ICD-10-CM | POA: Insufficient documentation

## 2012-11-29 DIAGNOSIS — K219 Gastro-esophageal reflux disease without esophagitis: Secondary | ICD-10-CM | POA: Insufficient documentation

## 2012-11-29 DIAGNOSIS — R0789 Other chest pain: Secondary | ICD-10-CM | POA: Insufficient documentation

## 2012-11-29 DIAGNOSIS — I2510 Atherosclerotic heart disease of native coronary artery without angina pectoris: Secondary | ICD-10-CM | POA: Insufficient documentation

## 2012-11-29 HISTORY — PX: LEFT HEART CATHETERIZATION WITH CORONARY/GRAFT ANGIOGRAM: SHX5450

## 2012-11-29 SURGERY — LEFT HEART CATHETERIZATION WITH CORONARY/GRAFT ANGIOGRAM
Anesthesia: LOCAL

## 2012-11-29 MED ORDER — SODIUM CHLORIDE 0.9 % IJ SOLN: 3.0000 mL | INTRAMUSCULAR | Status: DC | PRN

## 2012-11-29 MED ORDER — SODIUM CHLORIDE 0.9 % IJ SOLN: 3.0000 mL | Freq: Two times a day (BID) | INTRAMUSCULAR | Status: DC

## 2012-11-29 MED ORDER — ASPIRIN 81 MG PO CHEW
324.0000 mg | CHEWABLE_TABLET | ORAL | Status: AC
  Administered 2012-11-29: 324 mg via ORAL

## 2012-11-29 MED ORDER — RANOLAZINE ER 500 MG PO TB12: 500.0000 mg | ORAL_TABLET | Freq: Two times a day (BID) | ORAL | Status: DC

## 2012-11-29 MED ORDER — FENTANYL CITRATE 0.05 MG/ML IJ SOLN
INTRAMUSCULAR | Status: AC
  Filled 2012-11-29: qty 2

## 2012-11-29 MED ORDER — NITROGLYCERIN 0.2 MG/ML ON CALL CATH LAB
INTRAVENOUS | Status: AC
  Filled 2012-11-29: qty 1

## 2012-11-29 MED ORDER — ASPIRIN 81 MG PO CHEW
CHEWABLE_TABLET | ORAL | Status: AC
  Filled 2012-11-29: qty 4

## 2012-11-29 MED ORDER — DIAZEPAM 5 MG PO TABS
5.0000 mg | ORAL_TABLET | ORAL | Status: AC
  Administered 2012-11-29: 5 mg via ORAL

## 2012-11-29 MED ORDER — SODIUM CHLORIDE 0.9 % IV SOLN
INTRAVENOUS | Status: DC
  Administered 2012-11-29: 10:00:00 via INTRAVENOUS

## 2012-11-29 MED ORDER — DIAZEPAM 5 MG PO TABS
ORAL_TABLET | ORAL | Status: AC
  Filled 2012-11-29: qty 1

## 2012-11-29 MED ORDER — HEPARIN (PORCINE) IN NACL 2-0.9 UNIT/ML-% IJ SOLN
INTRAMUSCULAR | Status: AC
  Filled 2012-11-29: qty 1000

## 2012-11-29 MED ORDER — MIDAZOLAM HCL 2 MG/2ML IJ SOLN
INTRAMUSCULAR | Status: AC
  Filled 2012-11-29: qty 2

## 2012-11-29 MED ORDER — SODIUM CHLORIDE 0.9 % IV SOLN: 250.0000 mL | INTRAVENOUS | Status: DC | PRN

## 2012-11-29 MED ORDER — LIDOCAINE HCL (PF) 1 % IJ SOLN
INTRAMUSCULAR | Status: AC
  Filled 2012-11-29: qty 30

## 2012-11-29 NOTE — H&P (View-Only) (Signed)
Patient ID: Robert Melton, male   DOB: [birth year], 62 y.o.   MRN: [id number]     PATIENT PROFILE: [name] is a 62 year old was established coronary artery disease who represents to the office today after 3 years with a chief complaint of recurrent episodes of increasing chest pain.   HPI: [name] suffered a prior myocardial infarction treated with PTCA at [facility] in 1993 and stenting. In October 2009 he developed an acute coronary syndrome and cardiac catheterization revealed 95% ostial LAD stenosis, subtotal 99% circumflex stenosis, and mild to moderate residual narrowing in his right carotid artery. He underwent CABG surgery and [facility] the LIMA to the LAD, free RIMA graft to the OM1, and sequential vein graft to the PDA and PLA vessels. Additional problems have included hypertension, hyperlipidemia, low HDL levels. In the past, he had been on combination therapy with Niaspan plus Crestor to apparently, he developed recurrent chest pain in June 2011 and repeat catheterization revealed a 99% stenosis at the anastomosis of the vein graft to the distal right coronary artery. He had a patent LIMA graft to the LAD and a patent free RIMA graft to the obtuse marginal vessel. There was an occluded sequential limb which previously supplied the PL vessel. Underwent successful PTCA of the 99% stenosis to less than 20%.  [name] has not been seen in our office in 3 years. He states his insurance had changed the constantly he was not followed by any cardiologist. In addition, his primary physician in [place] is no longer practice. Proximally it 6 weeks ago, he ran out of most of his medications for one week. At that time he establish with a Robert [facility] in [place] at [facility] was started back on his medications per however, with the past month he has experienced recurrent episodes of chest tightness and pressure. Recently, these have increased in intensity and frequency related  experience a more significant episode last night. He presents to the office today for cardiology evaluation.  He denies presyncope or syncope. He admits to decreased energy. He has not been very active due to hip discomfort but when he is active he does note some shortness of breath.  Past Medical History  Diagnosis Date  . CAD (coronary artery disease)   . History of MI (myocardial infarction) 1993    PTCA at [facility] with stenting  . Hypertension   . Hyperlipidemia   . GERD (gastroesophageal reflux disease)   . Broken neck 2008    fall from ladder  . History of echocardiogram 10/29/2009    EF 50-55%; LV systolic function normal; mildly sclerotic AV  . History of nuclear stress test 08/21/2008    dipyridamole; negative for ischemia; normal study   . History of Doppler ultrasound 04/13/2006    LEAs; bilat ABIs - no evidence of arterial insuff.; bilat PVRs - normal; irregular non-hemodynamically significant plaque bilat in SFA    Past Surgical History  Procedure Laterality Date  . Coronary artery bypass graft  2009    revascularization by Dr. Orson Melton; LIMA to LAD, free RIMA graft to OM1, sequential vein gradt to PDA & PL segment  . Cardiac catheterization  10/24/2009    r/t CP; significant native CAD, patent LIMA graft to LAD & patent RIMA to obtuse marginal vessel; graft to RCA 99% stenosed at anastomses; stenting to RCA reduced to 20-30%  . Lumbar spine surgery  2004    s/p fusion L4-S1; 4 back surgeries  .  Parathyroidectomy  2001  . Knee surgery      Left Knee (1992), Right Knee (1998);   Marland Kitchen Carpal tunnel release  2000  . Neck fusion  1999  . Skin cancer excision  1999    face    No Known Allergies  Current Outpatient Prescriptions  Medication Sig Dispense Refill  . aspirin 325 MG tablet Take 325 mg by mouth daily.      Marland Kitchen atorvastatin (LIPITOR) 40 MG tablet Take 40 mg by mouth daily.      . Ferrous Sulfate (IRON) 325 (65 FE) MG TABS Take 1 capsule by mouth daily.      Marland Kitchen  lisinopril (PRINIVIL,ZESTRIL) 20 MG tablet Take 20 mg by mouth daily.      Marland Kitchen loratadine (CLARITIN) 10 MG tablet Take 10 mg by mouth daily as needed for allergies.      . naproxen sodium (ANAPROX) 220 MG tablet Take 220 mg by mouth 2 (two) times daily with a meal.      . nitroGLYCERIN (NITROSTAT) 0.4 MG SL tablet Place 0.4 mg under the tongue every 5 (five) minutes as needed for chest pain.      . pantoprazole (PROTONIX) 40 MG tablet Take 40 mg by mouth daily.      Marland Kitchen senna-docusate (SENOKOT-S) 8.6-50 MG per tablet Take 1 tablet by mouth daily.      . isosorbide mononitrate (IMDUR) 60 MG 24 hr tablet Take 1 tablet (60 mg total) by mouth daily.  90 tablet  3  . metoprolol tartrate (LOPRESSOR) 25 MG tablet Take 1 tablet (25 mg total) by mouth 2 (two) times daily.  180 tablet  3   No current facility-administered medications for this visit.    Social history is notable that he is married for 17 years. He has 4 children 10 grandchildren. He lives at home with his wife and 4 grandchildren he completed 12 grades of education. He is disabled he still smokes 4 cigarettes per day per does not drink alcohol.  Family History  Problem Relation Age of Onset  . Stroke Father   . Heart disease Father   . Heart disease Mother   . Heart attack Mother   . Hyperlipidemia Mother   . Hyperlipidemia Sister     ROS is negative for fever chills or night sweats he denies skin changes per he denies visual changes. He denies wheezing. He denies pre-syncope or syncope. He does have some mild shortness of breath. The episodes of chest pain that he has experienced recently are very similar to the chest pain that he experienced prior to his last intervention which was done in June 2011. He denies bleeding. Denies worsening GERD. He denies GU symptoms. He denies significant edema. He denies myalgias. 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 He denies psychiatric changes. He denies paresthesias. There are no seizures.  Other system review is negative.  PE BP 155/88  Pulse 71  Ht 5\' 3"  (1.6 m)  Wt 185 lb 4.8 oz (84.052 kg)  BMI 32.83 kg/m2 General: Alert, oriented, no distress.  Skin: normal turgor, no rashes HEENT: Normocephalic, atraumatic. Pupils round and reactive; sclera anicteric; Fundi mild arteriolar narrowing.   Nose without nasal septal hypertrophy Mouth/Parynx benign; Mallinpatti scale 3. He does have upper dentures. Neck: No JVD, no carotid briuts Lungs: clear to ausculatation and percussion; no wheezing or rales Heart: RRR, s1 s2 normal 2/6 systolic murmur in the aortic region. No S3 Abdomen: Mildly obese. soft, nontender; no hepatosplenomehaly,  BS+; abdominal aorta nontender and not dilated by palpation. Pulses 2+ Extremities: no clubbinbg cyanosis or edema, Homan's sign negative  Neurologic: grossly nonfocal    ECG: Normal sinus rhythm at 71 beats per minute the nondiagnostic T-wave changes V5 and V6.  LABS:  BMET    Component Value Date/Time   NA 140 10/25/2009 0535   K 4.3 10/25/2009 0535   CL 109 10/25/2009 0535   CO2 24 10/25/2009 0535   GLUCOSE 102* 10/25/2009 0535   BUN 13 10/25/2009 0535   CREATININE 0.91 10/25/2009 0535   CALCIUM 8.7 10/25/2009 0535   GFRNONAA >60 10/25/2009 0535   GFRAA  Value: >60        The eGFR has been calculated using the MDRD equation. This calculation has not been validated in all clinical situations. eGFR's persistently <60 mL/min signify possible Chronic Kidney Disease. 10/25/2009 0535     Hepatic Function Panel     Component Value Date/Time   PROT 5.9* 03/02/2008 0349   ALBUMIN 3.5 03/02/2008 0349   AST 13 03/02/2008 0349   ALT 14 03/02/2008 0349   ALKPHOS 55 03/02/2008 0349   BILITOT 0.6 03/02/2008 0349     CBC    Component Value Date/Time   WBC 6.6 10/25/2009 0535   RBC 3.96* 10/25/2009 0535   HGB 12.7* 10/25/2009 0535   HCT 36.6* 10/25/2009 0535   PLT 159 10/25/2009 0535   MCV 92.3 10/25/2009 0535   MCHC 34.7 10/25/2009 0535   RDW  13.0 10/25/2009 0535   LYMPHSABS 2.0 02/29/2008 1215   MONOABS 0.4 02/29/2008 1215   EOSABS 0.3 02/29/2008 1215   BASOSABS 0.1 02/29/2008 1215     BNP No results found for this basename: probnp    Lipid Panel  No results found for this basename: chol, trig, hdl, cholhdl, vldl, ldlcalc     RADIOLOGY: No results found.   ASSESSMENT AND PLAN: [name] is a 62 year old gentleman who suffered a myocardial infarction in 1993 and underwent stenting of his right coronary artery at that time. In December 2009 he developed an acute coronary syndrome and underwent CABG surgery. In June 2011 he was found to have 99% stenosis at the anastomosis of the graft supplying the PDA and the limb supplying the PLA vessel was occluded. He underwent successful intervention at this site with PTCA and subsequently felt improved. He has not been seen in 3 years. He now experiences recurrent episodes of chest pain worrisome for class III angina. Of note, he is not on a beta blocker as well as amlodipine medications which he was on 3 years ago. I did discuss with him increased medical therapy and shadow G. with nuclear imaging versus cardiac catheterization and his preference is to pursue definitive diagnostic cardiac catheterization. I am starting him on isosorbide mononitrate 30 mg for the next 2 days and then he'll increase this to 60 mg. I'm also starting him on Lopressor 25 mg twice a day. I'm scheduling him for a 2-D echo Doppler study to evaluate systolic and diastolic function as well as to further evaluate his aortic valve the light of his murmur. Report will be obtained. Cardiac catheterization will tentatively be scheduled next week at [facility]. I discussed risks benefits of the procedure and the patient wishes to proceed.   Lennette Bihari, MD, [facility] 11/24/2012 6:15 PM

## 2012-11-29 NOTE — Telephone Encounter (Signed)
ranexa 500 mg sent to patient's pharmacy per V.O. From Dr. Tresa Endo.

## 2012-11-29 NOTE — CV Procedure (Signed)
Robert Melton is a 62 y.o. male    [id number]  [id number] LOCATION:  FACILITY: [facility]  PHYSICIAN: Lennette Bihari, MD, [facility] [birth year]   DATE OF PROCEDURE:  11/29/2012    [facility]  CARDIAC CATHETERIZATION     HISTORY: Robert Melton is a 62 year old gentleman who suffered a myocardial infarction in 1993 and underwent stenting of his right artery that time. In December 2009 he developed an acute coronary syndrome and underwent CABG revascularization surgery with LIMA to his LAD, a free RIMA graft to the OM1 vessel, and SVG sequentially to his PDA and PLA vessel. Has a history of hypertension, obesity, hyperlipidemia with low HDL levels. Remotely, he had been on combination therapy. In June 2000 11 repeat catheterization revealed a 99% stenosis at the anastomosis of the vein graft to the distal right coronary artery. His LIMA was patent to the LAD and pain-free  The OM1 vessel. He had an occluded sequential limb to this PLA vessel. He underwent progenitor intervention to the distal SVG graft and PDA vessel. The patient apparently had been lost to followup. He was recently seen back in the office last week after he experienced recurrent episodes of chest discomfort. The concerns for Robert Melton progressive risk, he was started on Lopressor 25 twice a day as well as isosorbide mononitrate. Of note, he had stopped a lot of the medications which he had been on over 3 years ago. He presents now for definitive diagnostic cardiac catheterization.  PROCEDURE:  The patient was brought to the second floor [facility] cath lab in the postabsorptive state. Versed 2 mg and fentanyl 50 mcg was administered were administered for conscious sedation.  His right groin was prepped and shaved in usual sterile fashion. Xylocaine 1% was used for local anesthesia. A 5 French sheath was inserted into the right femoral artery. Diagnostic catheterizatiion was done with 5 [place] LF4, FR4, LIMA  and pigtail catheters. Left ventriculography was done with 24  cc Omnipaque contrast. Hemostasis was obtained by direct manual compression. The patient tolerated the procedure well.   HEMODYNAMICS:   Central Aorta: 144/70   Left Ventricle: 144/13/24  ANGIOGRAPHY:  1. Left main: Short and immediately bifurcated into the LAD and the circumflex system.  2. LAD: Was calcified it was occluded proximal to small septal branch.  3. Left circumflex: Proximal luminal narrowing and then was 90% so proximal to giving rise to the first marginal branch. Competetive filling was visualized in the marginal vessel  4. Right coronary artery:  Calcified diffusely diseased native vessel with 40% proximal narrowing 30% mid narrowing and 70% narrowing in the region of the acute margin. The RCA and in the PLA vessel which had 40% narrowing and was moderate caliber extending to the apex through the PDA was not well visualized from the native injection. 5.LIMA TO LAD was wiidely patent and  anastomosed to the mid LAD. The LAD mid-distally was a twin like vessel with LAD and Diagonal branches not extending to the apex.   6. Free radial graft supplying the OM1 vessel was widely patent. There was 70% narrowing the OM1 vessel proximal to the graft anastomosis. He was filling down the distal circumflex retrograde grade from his hormone injection. 7. SVG to RCA was widely patent. There was no restenosis at the distal anastomosis site. He was no restenosis in the PDA vessel beyond the anastomosis with residual narrowing of less than 20%.     8.  Left ventriculography; RAO left  ventriculogram was performed using  24 mL of Omnipaque dye at 12 mL/second.  It was my global hypokinesis normally function with ejection fraction of 50%. There was no mitral regurgitation   IMPRESSION:   1. Low normal LV function with ejection fraction of 50% 2. Significant native coronary obstructive disease with calcified LAD and total occlusion  proximally, 90% proximal left circumflex stenosis with 70% stenosis in the OM1 vessel ; 3. Diffuse native calcified vessel  RCA with 30 5, 405 and70% stenoses in the proximal mid and acute margin region of right artery disease and 40% PLA stenosis. 4. Patent LIMA to LAD 5. Patent free RIMA graft to the OM1 vessel 6. Patent saphenous vein graft to the PDA without evidence for restenosis at the site of distal vein graft PTCA and PDA vessel an old occluded sequential limb which has previously supplied the PLA vessel.   Recommendation:  Increase medical therapy with risk factor modification.  Lennette Bihari, MD, [facility] 11/29/2012 2:26 PM

## 2012-11-29 NOTE — Interval H&P Note (Signed)
History and Physical Interval Note:  11/29/2012 1:41 PM  Robert Melton  has presented today for surgery, with the diagnosis of cad  The various methods of treatment have been discussed with the patient and family. After consideration of risks, benefits and other options for treatment, the patient has consented to  Procedure(s): LEFT HEART CATHETERIZATION WITH CORONARY/GRAFT ANGIOGRAM (N/A) as a surgical intervention .  The patient's history has been reviewed, patient examined, no change in status, stable for surgery.  I have reviewed the patient's chart and labs.  Questions were answered to the patient's satisfaction.     KELLY,THOMAS A

## 2012-12-12 ENCOUNTER — Ambulatory Visit: Payer: Medicare Other | Admitting: Cardiovascular Disease

## 2012-12-14 ENCOUNTER — Encounter: Payer: Self-pay | Admitting: Cardiovascular Disease

## 2012-12-14 ENCOUNTER — Ambulatory Visit (INDEPENDENT_AMBULATORY_CARE_PROVIDER_SITE_OTHER): Payer: Medicare Other | Admitting: Cardiovascular Disease

## 2012-12-14 VITALS — BP 140/70 | HR 50 | Ht 62.0 in | Wt 185.0 lb

## 2012-12-14 DIAGNOSIS — E669 Obesity, unspecified: Secondary | ICD-10-CM

## 2012-12-14 DIAGNOSIS — E785 Hyperlipidemia, unspecified: Secondary | ICD-10-CM

## 2012-12-14 DIAGNOSIS — I10 Essential (primary) hypertension: Secondary | ICD-10-CM

## 2012-12-14 DIAGNOSIS — I252 Old myocardial infarction: Secondary | ICD-10-CM

## 2012-12-14 DIAGNOSIS — I2510 Atherosclerotic heart disease of native coronary artery without angina pectoris: Secondary | ICD-10-CM

## 2012-12-14 MED ORDER — RANOLAZINE ER 1000 MG PO TB12: 1000.0000 mg | ORAL_TABLET | Freq: Two times a day (BID) | ORAL | Status: DC

## 2012-12-14 MED ORDER — RANOLAZINE ER 1000 MG PO TB12: 500.0000 mg | ORAL_TABLET | Freq: Two times a day (BID) | ORAL | Status: DC

## 2012-12-14 NOTE — Patient Instructions (Addendum)
Your physician has recommended you make the following change in your medication:INCREASE RANEXA TO 1000 MG TWICE DAILY.  Your physician recommends that you return for lab work in: 3 MONTHS.  Your physician recommends that you schedule a follow-up appointment in: 3 MONTHS.

## 2012-12-14 NOTE — Progress Notes (Signed)
Patient ID: Robert Melton, male   DOB: 13-Jun-1950, 62 y.o.   MRN: [id number]     HPI: Robert Melton, is a 62 y.o. male who presents to the office today in followup of his recent hospitalization and cardiac catheterization. Robert Melton has established coronary artery disease and in 1993 suffered a prior myocardial infarction treated with PTCA at [facility]. In December 2009 he was found to have progressive CAD and underwent CABG surgery by Dr. Dorris Fetch with a LIMA to the LAD, free RIMA graft to the OM1, and sequential vein graft to the PDA and PLA segment. In June 2000 and 11 repeat catheterization for recurrent chest pain showed 99% stenosis at the anastomosis of the vein graft to his right coronary artery. He also had an occluded sequential limb which had previously supplied the PLA vessel. He underwent successful PTCA of the 99% anastomosis stenosis to the PDA at that time and felt markedly improved. Her last month, he experienced increasing episodes of recurrent anginal symptomatology. He ultimately was hospitalized with unstable angina and non-11/29/2012 underwent repeat catheterization by me. This showed low normal ejection fraction at 50%. He had significant native CAD with calcified LAD and total occlusion proximally. The was a 90% proximal circumflex stenosis with 70% stenosis in the OM1 vessel. Right coronary artery was diffusely diseased and calcified with 3040 and 70% proximal mid acute marginal stenoses as well as a 40% PLA stenosis. He had been LIMA graft to the LAD, a pain free RIMA graft to the OM1 vessel, and it pain vein graft to the PDA without evidence for restenosis at the anastomosis site but again he had an occluded sequential limb.  During that hospitalization we had increased his medical regimen. He has noticed marked improvement in his prior symptomatology but he still has experienced some episodes of chest pain since going home he presents now for evaluation. An echo Doppler revealed  ejection fraction 55-60%. Mild aortic valve sclerosis without stenosis. Trivial tricuspid and pulmonic regurgitation.  Past Medical History  Diagnosis Date  . CAD (coronary artery disease)   . History of MI (myocardial infarction) 1993    PTCA at [facility] with stenting  . Hypertension   . Hyperlipidemia   . GERD (gastroesophageal reflux disease)   . Broken neck 2008    fall from ladder  . History of echocardiogram 10/29/2009    EF 50-55%; LV systolic function normal; mildly sclerotic AV  . History of nuclear stress test 08/21/2008    dipyridamole; negative for ischemia; normal study   . History of Doppler ultrasound 04/13/2006    LEAs; bilat ABIs - no evidence of arterial insuff.; bilat PVRs - normal; irregular non-hemodynamically significant plaque bilat in SFA    Past Surgical History  Procedure Laterality Date  . Coronary artery bypass graft  2009    revascularization by Dr. Orson Aloe; LIMA to LAD, free RIMA graft to OM1, sequential vein gradt to PDA & PL segment  . Cardiac catheterization  10/24/2009    r/t CP; significant native CAD, patent LIMA graft to LAD & patent RIMA to obtuse marginal vessel; graft to RCA 99% stenosed at anastomses; stenting to RCA reduced to 20-30%  . Lumbar spine surgery  2004    s/p fusion L4-S1; 4 back surgeries  . Parathyroidectomy  2001  . Knee surgery      Left Knee (1992), Right Knee (1998);   Marland Kitchen Carpal tunnel release  2000  . Neck fusion  1999  . Skin cancer excision  1999    face    No Known Allergies  Current Outpatient Prescriptions  Medication Sig Dispense Refill  . aspirin 325 MG tablet Take 325 mg by mouth daily.      Marland Kitchen atorvastatin (LIPITOR) 40 MG tablet Take 40 mg by mouth daily.      Marland Kitchen docusate sodium (COLACE) 100 MG capsule Take 100 mg by mouth daily.      . Ferrous Sulfate (IRON) 325 (65 FE) MG TABS Take 1 capsule by mouth daily.      Marland Kitchen HYDROcodone-acetaminophen (NORCO/VICODIN) 5-325 MG per tablet Take 1 tablet by mouth as needed.       . isosorbide mononitrate (IMDUR) 60 MG 24 hr tablet Take 1 tablet (60 mg total) by mouth daily.  90 tablet  3  . lisinopril (PRINIVIL,ZESTRIL) 20 MG tablet Take 20 mg by mouth daily.      Marland Kitchen loratadine (CLARITIN) 10 MG tablet Take 10 mg by mouth daily as needed for allergies.      . metoprolol tartrate (LOPRESSOR) 25 MG tablet Take 1 tablet (25 mg total) by mouth 2 (two) times daily.  180 tablet  3  . nitroGLYCERIN (NITROSTAT) 0.4 MG SL tablet Place 0.4 mg under the tongue every 5 (five) minutes as needed for chest pain.      . pantoprazole (PROTONIX) 40 MG tablet Take 40 mg by mouth daily.      . ranolazine (RANEXA) 500 MG 12 hr tablet Take 1 tablet (500 mg total) by mouth 2 (two) times daily.  60 tablet  6   No current facility-administered medications for this visit.    Socially he is married has 4 children 10 grandchildren. There is no alcohol use. There is a prior tobacco history  ROS is negative for fevers, chills or night sweats.  He denies visual symptoms. He does note some mild recurrent episodes of chest pain but markedly improved since his catheterization. He denies wheezing. He denies significant weight loss. He denies bleeding groin site has been stable. He denies paresthesias. He denies restless legs. There are no tremors. He denies myalgias. Other system review is negative.  PE BP 140/70  Pulse 50  Ht 5\' 2"  (1.575 m)  Wt 185 lb (83.915 kg)  BMI 33.83 kg/m2  General: Alert, oriented, no distress.  Skin: normal turgor, no rashes HEENT: Normocephalic, atraumatic. Pupils round and reactive; sclera anicteric;no lid lag.  Nose without nasal septal hypertrophy Mouth/Parynx benign; Mallinpatti scale 3 Neck: No JVD, no carotid briuts Lungs: clear to ausculatation and percussion; no wheezing or rales Heart: RRR, s1 s2 normal 1/6 systolic murmur at the Abdomen: soft, nontender; no hepatosplenomehaly, BS+; abdominal aorta nontender and not dilated by palpation. Pulses 2+ right  groin catheterization site stable. Extremities: no clubbing cyanosis or edema, Homan's sign negative  Neurologic: grossly nonfocal  ECG: Sinus bradycardia at 50 beats per minute. Nonspecific ST abnormalities.  LABS:  BMET    Component Value Date/Time   NA 141 11/27/2012 0830   K 3.8 11/27/2012 0830   CL 109 11/27/2012 0830   CO2 25 11/27/2012 0830   GLUCOSE 126* 11/27/2012 0830   BUN 18 11/27/2012 0830   CREATININE 0.96 11/27/2012 0830   CREATININE 0.91 10/25/2009 0535   CALCIUM 9.1 11/27/2012 0830   GFRNONAA >60 10/25/2009 0535   GFRAA  Value: >60        The eGFR has been calculated using the MDRD equation. This calculation has not been validated in all clinical situations. eGFR's persistently <  60 mL/min signify possible Chronic Kidney Disease. 10/25/2009 0535     Hepatic Function Panel     Component Value Date/Time   PROT 6.0 11/27/2012 0830   ALBUMIN 3.9 11/27/2012 0830   AST 14 11/27/2012 0830   ALT 15 11/27/2012 0830   ALKPHOS 90 11/27/2012 0830   BILITOT 0.5 11/27/2012 0830     CBC    Component Value Date/Time   WBC 7.0 11/27/2012 0830   RBC 4.53 11/27/2012 0830   HGB 13.5 11/27/2012 0830   HCT 40.0 11/27/2012 0830   PLT 182 11/27/2012 0830   MCV 88.3 11/27/2012 0830   MCH 29.8 11/27/2012 0830   MCHC 33.8 11/27/2012 0830   RDW 14.3 11/27/2012 0830   LYMPHSABS 2.0 02/29/2008 1215   MONOABS 0.4 02/29/2008 1215   EOSABS 0.3 02/29/2008 1215   BASOSABS 0.1 02/29/2008 1215     BNP No results found for this basename: probnp    Lipid Panel     Component Value Date/Time   CHOL 118 11/27/2012 0830   TRIG 143 11/27/2012 0830   HDL 23* 11/27/2012 0830   CHOLHDL 5.1 11/27/2012 0830   VLDL 29 11/27/2012 0830   LDLCALC 66 11/27/2012 0830     RADIOLOGY: Dg Chest 2 View  11/27/2012   *RADIOLOGY REPORT*  Clinical Data: Chest pain and shortness of breath.  Preop cardiac cath.  CHEST - 2 VIEW  Comparison: 10/21/2009.  Findings: The cardiac silhouette, mediastinal and hilar contours are  normal and stable.  Stable surgical changes from bypass surgery.  The lungs are clear.  No pleural effusion.  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  IMPRESSION: No acute cardiopulmonary findings.   Original Report Authenticated By: Rudie Meyer, M.D.      ASSESSMENT AND PLAN: Mr. Schonberg has established coronary artery disease dating back to his initial myocardial infarction 21 years ago. His most recent cardiac catheterization has shown patent grafts with previously demonstrated old sequential limb occlusion of the graft from the PDA to the PLA vessel. He does have potential sources of native vessel ischemia not supplied well by the grafts. He has noticed a marked improvement with the initiation of Ranexa 500 twice a day but still has noticed some recurrent chest pain. I will further titrate this to 1000 mg twice a day. In 3 months, repeat laboratory will be obtained including an NMR lipoprofile. I will see him back in the office in followup evaluation and further recommendations will be made at that time.     Lennette Bihari, MD, [facility]  12/14/2012 12:02 PM

## 2013-02-26 ENCOUNTER — Other Ambulatory Visit: Payer: Self-pay | Admitting: *Deleted

## 2013-02-26 ENCOUNTER — Telehealth: Payer: Self-pay | Admitting: *Deleted

## 2013-02-26 ENCOUNTER — Encounter: Payer: Self-pay | Admitting: *Deleted

## 2013-02-26 DIAGNOSIS — I2510 Atherosclerotic heart disease of native coronary artery without angina pectoris: Secondary | ICD-10-CM

## 2013-02-26 DIAGNOSIS — I10 Essential (primary) hypertension: Secondary | ICD-10-CM

## 2013-02-26 NOTE — Telephone Encounter (Signed)
Letter along with lab order sent to patient to have blood work drawn.

## 2013-03-12 ENCOUNTER — Telehealth: Payer: Self-pay | Admitting: *Deleted

## 2013-03-12 LAB — COMPREHENSIVE METABOLIC PANEL
AST: 17 U/L (ref 0–37)
Albumin: 4.4 g/dL (ref 3.5–5.2)
Alkaline Phosphatase: 90 U/L (ref 39–117)
Potassium: 4.1 mEq/L (ref 3.5–5.3)
Sodium: 138 mEq/L (ref 135–145)
Total Bilirubin: 0.6 mg/dL (ref 0.3–1.2)
Total Protein: 6.3 g/dL (ref 6.0–8.3)

## 2013-03-12 LAB — CBC
MCH: 31.3 pg (ref 26.0–34.0)
MCHC: 34.8 g/dL (ref 30.0–36.0)
RDW: 15.1 % (ref 11.5–15.5)

## 2013-03-12 NOTE — Telephone Encounter (Signed)
Returned a call to patient from a message that he left on the website on Sunday. Patient questioned were to get his labs drawn. When returned his call the patient has already resolved the problem, and had his blood drawn.

## 2013-03-16 ENCOUNTER — Encounter: Payer: Self-pay | Admitting: Cardiovascular Disease

## 2013-03-16 ENCOUNTER — Ambulatory Visit (INDEPENDENT_AMBULATORY_CARE_PROVIDER_SITE_OTHER): Payer: Medicare Other | Admitting: Cardiovascular Disease

## 2013-03-16 VITALS — BP 140/70 | HR 57 | Ht 62.0 in | Wt 184.9 lb

## 2013-03-16 DIAGNOSIS — I252 Old myocardial infarction: Secondary | ICD-10-CM

## 2013-03-16 DIAGNOSIS — M545 Low back pain, unspecified: Secondary | ICD-10-CM | POA: Insufficient documentation

## 2013-03-16 DIAGNOSIS — I2510 Atherosclerotic heart disease of native coronary artery without angina pectoris: Secondary | ICD-10-CM

## 2013-03-16 DIAGNOSIS — R5381 Other malaise: Secondary | ICD-10-CM

## 2013-03-16 DIAGNOSIS — E669 Obesity, unspecified: Secondary | ICD-10-CM

## 2013-03-16 DIAGNOSIS — K219 Gastro-esophageal reflux disease without esophagitis: Secondary | ICD-10-CM

## 2013-03-16 DIAGNOSIS — I10 Essential (primary) hypertension: Secondary | ICD-10-CM

## 2013-03-16 DIAGNOSIS — E785 Hyperlipidemia, unspecified: Secondary | ICD-10-CM

## 2013-03-16 NOTE — Patient Instructions (Signed)
Your physician recommends that you schedule a follow-up appointment in: 6 months  Your physician recommends that you return for lab work NMR lipids, TSH

## 2013-03-16 NOTE — Progress Notes (Signed)
Patient ID: Robert Melton, male   DOB: 06-23-1950, 62 y.o.   MRN: [id number]     HPI: Robert Melton, is a 62 y.o. male who presents to the office today for a three-month followup cardiology evaluation.  Robert Melton has established coronary artery disease and in 1993 suffered a prior myocardial infarction treated with PTCA at [facility]. In December 2009 he was found to have progressive CAD and underwent CABG surgery by Dr. Dorris Melton with a LIMA to the LAD, free RIMA graft to the OM1, and sequential vein graft to the PDA and PLA segment. In June 2011 repeat catheterization for recurrent chest pain showed 99% stenosis at the anastomosis of the vein graft to his right coronary artery. He also had an occluded sequential limb which had previously supplied the PLA vessel. He underwent successful PTCA of the 99% anastomosis stenosis to the PDA at that time and felt markedly improved. In June 2014, he experienced increasing episodes of recurrent anginal symptomatology. He was hospitalized with unstable angina on 11/29/2012 underwent repeat catheterization by me. This showed low normal ejection fraction at 50%. He had significant native CAD with calcified LAD and total occlusion proximally. The was a 90% proximal circumflex stenosis with 70% stenosis in the OM1 vessel. Right coronary artery was diffusely diseased and calcified with 3040 and 70% proximal mid acute marginal stenoses as well as a 40% PLA stenosis. He had been LIMA graft to the LAD, a pain free RIMA graft to the OM1 vessel, and it pain vein graft to the PDA without evidence for restenosis at the anastomosis site but again he had an occluded sequential limb.  During that hospitalization we had increased his medical regimen. He has noticed marked improvement in his prior symptomatology but he still has experienced some episodes of chest pain. An echo Doppler revealed ejection fraction 55-60%. Mild aortic valve sclerosis without stenosis. Trivial tricuspid and  pulmonic regurgitation. His chest pain symptoms have significantly improved with the addition of Ranexa initially at 500 twice a day. When I last saw him on December 14, 2012,  I recommended further titration to 1000 twice a day.  He was noticing some mild headache. He has been taking this only 500 mg in the morning but still takes 1000 mg at night. He presents now for evaluation.  Past Medical History  Diagnosis Date  . CAD (coronary artery disease)   . History of MI (myocardial infarction) 1993    PTCA at [facility] with stenting  . Hypertension   . Hyperlipidemia   . GERD (gastroesophageal reflux disease)   . Broken neck 2008    fall from ladder  . History of echocardiogram 10/29/2009    EF 50-55%; LV systolic function normal; mildly sclerotic AV  . History of nuclear stress test 08/21/2008    dipyridamole; negative for ischemia; normal study   . History of Doppler ultrasound 04/13/2006    LEAs; bilat ABIs - no evidence of arterial insuff.; bilat PVRs - normal; irregular non-hemodynamically significant plaque bilat in SFA    Past Surgical History  Procedure Laterality Date  . Coronary artery bypass graft  2009    revascularization by Dr. Orson Melton; LIMA to LAD, free RIMA graft to OM1, sequential vein gradt to PDA & PL segment  . Cardiac catheterization  10/24/2009    r/t CP; significant native CAD, patent LIMA graft to LAD & patent RIMA to obtuse marginal vessel; graft to RCA 99% stenosed at anastomses; stenting to RCA reduced to 20-30%  .  Lumbar spine surgery  2004    s/p fusion L4-S1; 4 back surgeries  . Parathyroidectomy  2001  . Knee surgery      Left Knee (1992), Right Knee (1998);   Marland Kitchen Carpal tunnel release  2000  . Neck fusion  1999  . Skin cancer excision  1999    face    No Known Allergies  Current Outpatient Prescriptions  Medication Sig Dispense Refill  . aspirin 325 MG tablet Take 325 mg by mouth daily.      Marland Kitchen atorvastatin (LIPITOR) 40 MG tablet Take 40 mg by mouth daily.       Marland Kitchen docusate sodium (COLACE) 100 MG capsule Take 100 mg by mouth daily.      . isosorbide mononitrate (IMDUR) 60 MG 24 hr tablet Take 1 tablet (60 mg total) by mouth daily.  90 tablet  3  . lisinopril (PRINIVIL,ZESTRIL) 20 MG tablet Take 20 mg by mouth daily.      Marland Kitchen loratadine (CLARITIN) 10 MG tablet Take 10 mg by mouth daily as needed for allergies.      . metoprolol tartrate (LOPRESSOR) 25 MG tablet Take 1 tablet (25 mg total) by mouth 2 (two) times daily.  180 tablet  3  . nitroGLYCERIN (NITROSTAT) 0.4 MG SL tablet Place 0.4 mg under the tongue every 5 (five) minutes as needed for chest pain.      . pantoprazole (PROTONIX) 40 MG tablet Take 40 mg by mouth daily.      . ranolazine (RANEXA) 1000 MG SR tablet Take 1,000 mg by mouth. Patient takes 500 mg AM and 1000 mg PM       No current facility-administered medications for this visit.    Socially he is married has 4 children 10 grandchildren. There is no alcohol use. There is a prior tobacco history  ROS is negative for fevers, chills or night sweats.  He denies visual symptoms. He denies cough or sputum production. He denies dizziness. He did note mild headache. He does note some very rare recurrent episodes of chest pain but markedly improved since his catheterization and Ranexa therapy implementation. He denies wheezing. He denies significant weight loss. He denies bleeding. He denies abdominal pain. He denies nausea vomiting. He denies urinary symptoms. He denies GU symptoms. He has had 4 low back operations. He does note low back and hip discomfort which makes it difficult for him to walk. He denies paresthesias. He denies restless legs. There are no tremors. He denies myalgias. He denies diabetes. Other comprehensive 12 system review is negative.  PE BP 140/70  Pulse 57  Ht 5\' 2"  (1.575 m)  Wt 184 lb 14.4 oz (83.87 kg)  BMI 33.81 kg/m2  General: Alert, oriented, no distress.  Skin: normal turgor, no rashes HEENT: Normocephalic,  atraumatic. Pupils round and reactive; sclera anicteric;no lid lag.  Nose without nasal septal hypertrophy Mouth/Parynx benign; Mallinpatti scale 3 Neck: No JVD, no carotid briuts Lungs: clear to ausculatation and percussion; no wheezing or rales Heart: RRR, s1 s2 normal 1/6 systolic murmur at the Abdomen: soft, nontender; no hepatosplenomehaly, BS+; abdominal aorta nontender and not dilated by palpation. Pulses 2+ ; specifically, distal pulses are 2+. Extremities: no clubbing cyanosis or edema, Homan's sign negative  Neurologic: grossly nonfocal  ECG: Sinus bradycardia at 57 beats per minute. Nonspecific ST abnormalities.  QTc interval 455 msec.  LABS:  BMET    Component Value Date/Time   NA 138 03/12/2013 0806   K 4.1 03/12/2013 0806  CL 107 03/12/2013 0806   CO2 26 03/12/2013 0806   GLUCOSE 105* 03/12/2013 0806   BUN 14 03/12/2013 0806   CREATININE 0.98 03/12/2013 0806   CREATININE 0.91 10/25/2009 0535   CALCIUM 9.0 03/12/2013 0806   GFRNONAA >60 10/25/2009 0535   GFRAA  Value: >60        The eGFR has been calculated using the MDRD equation. This calculation has not been validated in all clinical situations. eGFR's persistently <60 mL/min signify possible Chronic Kidney Disease. 10/25/2009 0535     Hepatic Function Panel     Component Value Date/Time   PROT 6.3 03/12/2013 0806   ALBUMIN 4.4 03/12/2013 0806   AST 17 03/12/2013 0806   ALT 18 03/12/2013 0806   ALKPHOS 90 03/12/2013 0806   BILITOT 0.6 03/12/2013 0806     CBC    Component Value Date/Time   WBC 7.2 03/12/2013 0803   RBC 4.63 03/12/2013 0803   HGB 14.5 03/12/2013 0803   HCT 41.7 03/12/2013 0803   PLT 174 03/12/2013 0803   MCV 90.1 03/12/2013 0803   MCH 31.3 03/12/2013 0803   MCHC 34.8 03/12/2013 0803   RDW 15.1 03/12/2013 0803   LYMPHSABS 2.0 02/29/2008 1215   MONOABS 0.4 02/29/2008 1215   EOSABS 0.3 02/29/2008 1215   BASOSABS 0.1 02/29/2008 1215     BNP No results found for this basename:  probnp    Lipid Panel     Component Value Date/Time   CHOL 118 11/27/2012 0830   TRIG 143 11/27/2012 0830   HDL 23* 11/27/2012 0830   CHOLHDL 5.1 11/27/2012 0830   VLDL 29 11/27/2012 0830   LDLCALC 66 11/27/2012 0830     RADIOLOGY: Dg Chest 2 View  11/27/2012   *RADIOLOGY REPORT*  Clinical Data: Chest pain and shortness of breath.  Preop cardiac cath.  CHEST - 2 VIEW  Comparison: 10/21/2009.  Findings: The cardiac silhouette, mediastinal and hilar contours are normal and stable.  Stable surgical changes from bypass surgery.  The lungs are clear.  No pleural effusion.  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  IMPRESSION: No acute cardiopulmonary findings.   Original Report Authenticated By: Rudie Meyer, M.D.      ASSESSMENT AND PLAN: Robert Melton has established coronary artery disease dating back to his initial myocardial infarction 21 years ago. His most recent cardiac catheterization has shown patent grafts with previously demonstrated old sequential limb occlusion of the graft from the PDA to the PLA vessel. He does have potential sources of native vessel ischemia not supplied well by the grafts. He has noticed a marked improvement with the initiation of Ranexa and now instead of taking 1000 mm twice a day he has been taking only 500 milligrams in the morning and 1000 mg at night. His chest pain has essentially resolved although there is is there were rare isolated episode. He did have recent laboratory consisting of a CBC and CMAC which I did review. Fasting glucose was 105. He was was supposed to have an MMR profile but this was not done. I will check an MMR as well as a TSH level. We again discussed weight loss and potential increased exercise if tolerated. Remotely, he did have negative lower extremity duplex imaging several years previously. I will see him in 6 months for followup cardiology evaluation but will contact him regarding his laboratory results his  medication adjustments are necessary.     Lennette Bihari, MD, [facility]  03/16/2013 11:50  AM    

## 2013-04-15 ENCOUNTER — Encounter: Payer: Self-pay | Admitting: *Deleted

## 2013-06-04 DIAGNOSIS — F17200 Nicotine dependence, unspecified, uncomplicated: Secondary | ICD-10-CM | POA: Insufficient documentation

## 2013-09-13 ENCOUNTER — Encounter: Payer: Self-pay | Admitting: Cardiovascular Disease

## 2013-09-13 ENCOUNTER — Ambulatory Visit (INDEPENDENT_AMBULATORY_CARE_PROVIDER_SITE_OTHER): Payer: Medicare Other | Admitting: Cardiovascular Disease

## 2013-09-13 VITALS — BP 138/80 | HR 57 | Ht 63.0 in | Wt 187.2 lb

## 2013-09-13 DIAGNOSIS — E785 Hyperlipidemia, unspecified: Secondary | ICD-10-CM

## 2013-09-13 DIAGNOSIS — E782 Mixed hyperlipidemia: Secondary | ICD-10-CM

## 2013-09-13 DIAGNOSIS — I2510 Atherosclerotic heart disease of native coronary artery without angina pectoris: Secondary | ICD-10-CM

## 2013-09-13 DIAGNOSIS — K219 Gastro-esophageal reflux disease without esophagitis: Secondary | ICD-10-CM

## 2013-09-13 DIAGNOSIS — I10 Essential (primary) hypertension: Secondary | ICD-10-CM

## 2013-09-13 DIAGNOSIS — R5383 Other fatigue: Secondary | ICD-10-CM

## 2013-09-13 DIAGNOSIS — I252 Old myocardial infarction: Secondary | ICD-10-CM

## 2013-09-13 DIAGNOSIS — R5381 Other malaise: Secondary | ICD-10-CM

## 2013-09-13 DIAGNOSIS — M545 Low back pain, unspecified: Secondary | ICD-10-CM

## 2013-09-13 DIAGNOSIS — E66811 Obesity, class 1: Secondary | ICD-10-CM

## 2013-09-13 DIAGNOSIS — E669 Obesity, unspecified: Secondary | ICD-10-CM

## 2013-09-13 MED ORDER — RANOLAZINE ER 1000 MG PO TB12: 1000.0000 mg | ORAL_TABLET | Freq: Two times a day (BID) | ORAL | Status: DC

## 2013-09-13 MED ORDER — ISOSORBIDE MONONITRATE ER 60 MG PO TB24: ORAL_TABLET | ORAL | Status: DC

## 2013-09-13 NOTE — Patient Instructions (Signed)
Your physician has recommended you make the following change in your medication: increase the ranexa to 1000 mg twice daily.  Increase the isosorbide to 90 mg daily ( 1 & 1/2 tablet)  Your physician recommends that you return for lab work fasting.  Your physician recommends that you schedule a follow-up appointment in: 3 months.

## 2013-09-16 ENCOUNTER — Encounter: Payer: Self-pay | Admitting: Cardiovascular Disease

## 2013-09-16 NOTE — Progress Notes (Signed)
Patient ID: Robert Melton, male   DOB: [birth year], 63 y.o.   MRN: [id number]     HPI: Robert Melton, Robert Melton a 64 y.o. male who presents to the office today for a 79-monthfollowup cardiology evaluation.  Mr. SWymerhas established coronary artery disease and in 1993 [facility] he was found to have progressive CAD and underwent CABG surgery by Dr. HRoxan Hockeywith a LIMA to the LAD, free RIMA graft to the OM1, and sequential vein graft to the PDA and PLA segment. In June 2011 repeat catheterization for recurrent chest pain showed 99% stenosis at the anastomosis of the vein graft to his right coronary artery. He also had an occluded sequential limb which had previously supplied the PLA vessel. He underwent successful PTCA of the 99% anastomosis stenosis to the PDA at that time and felt markedly improved. In June 2014, he experienced increasing episodes of recurrent anginal symptomatology. He was hospitalized with unstable angina on 11/29/2012 underwent repeat catheterization which showed low normal ejection fraction at 50%. He had significant native CAD with calcified LAD and total occlusion proximally. The was a 90% proximal circumflex stenosis with 70% stenosis in the OM1 vessel. Right coronary artery was diffusely diseased and calcified with 3040 and 70% proximal mid acute marginal stenoses as well as a 40% PLA stenosis. He had been LIMA graft to the LAD, a patent  RIMA graft to the OM1 vessel, and a patent vein graft to the PDA without evidence for restenosis at the anastomosis site but again he had an occluded sequential limb.  During that hospitalization we had increased his medical regimen. He has noticed marked improvement in his prior symptomatology but he still has experienced some episodes of chest pain. An echo Doppler revealed ejection fraction 55-60% with mild aortic valve sclerosis without stenosis. Trivial tricuspid and pulmonic  regurgitation. His chest pain symptoms  significantly improved with the addition of Ranexa initially at 500 twice a day.  Subsequently, I recommended further titration to 1000 twice a day.  However, he has been taking this only 500 mg in the morning but still takes 1000 mg at night.    His chest pain.  Symptoms have improved.  Over, he still notes chronic angina symptoms that occur infrequently and with increased activity.  Also, has difficulty with hip discomfort, as well as his back secondary to prior surgeries.  He presents now for evaluation.  Past Medical History  Diagnosis Date  . CAD (coronary artery disease)   . [facility] of MI ([facility]ocardial infarction) 1993    [facility]  . Hypertension   . Hyperlipidemia   . GERD (gastroesophageal reflux disease)   . Broken neck 2008    fall from ladder  . History of echocardiogram 10/29/2009    EF 506-30% LV systolic function normal; mildly sclerotic AV  . History of nuclear stress test 08/21/2008    dipyridamole; negative for ischemia; normal study   . History of Doppler ultrasound 04/13/2006    LEAs; bilat ABIs - no evidence of arterial insuff.; bilat PVRs - normal; irregular non-hemodynamically significant plaque bilat in SFA    Past Surgical History  Procedure Laterality Date  . Coronary artery bypass graft  2009    revascularization by Dr. HKoleen Nimrod LIMA to LAD, free RIMA graft to OM1, sequential vein gradt to PDA & PL segment  . Cardiac catheterization  10/24/2009    r/t CP; significant native CAD,  patent LIMA graft to LAD & patent RIMA to obtuse marginal vessel; graft to RCA 99% stenosed at anastomses; stenting to RCA reduced to 20-30%  . Lumbar spine surgery  2004    s/p fusion L4-S1; 4 back surgeries  . Parathyroidectomy  2001  . Knee surgery      Left Knee (1992), Right Knee (1998);   Marland Kitchen Carpal tunnel release  2000  . Neck fusion  1999  . Skin cancer excision  1999    face    No Known Allergies  Current Outpatient  Prescriptions  Medication Sig Dispense Refill  . aspirin 325 MG tablet Take 325 mg by mouth daily.      Marland Kitchen atorvastatin (LIPITOR) 40 MG tablet Take 40 mg by mouth daily.      Marland Kitchen docusate sodium (COLACE) 100 MG capsule Take 100 mg by mouth daily.      . isosorbide mononitrate (IMDUR) 60 MG 24 hr tablet Take 1.5 tablet daily  135 tablet  3  . lisinopril (PRINIVIL,ZESTRIL) 20 MG tablet Take 20 mg by mouth daily.      Marland Kitchen loratadine (CLARITIN) 10 MG tablet Take 10 mg by mouth daily as needed for allergies.      . metoprolol tartrate (LOPRESSOR) 25 MG tablet Take 1 tablet (25 mg total) by mouth 2 (two) times daily.  180 tablet  3  . Multiple Vitamin (MULTIVITAMIN) capsule Take 1 capsule by mouth daily.      . nitroGLYCERIN (NITROSTAT) 0.4 MG SL tablet Place 0.4 mg under the tongue every 5 (five) minutes as needed for chest pain.      . pantoprazole (PROTONIX) 40 MG tablet Take 40 mg by mouth 2 (two) times daily.       . ranolazine (RANEXA) 1000 MG SR tablet Take 1 tablet (1,000 mg total) by mouth 2 (two) times daily.  180 tablet  3   No current facility-administered medications for this visit.    Socially he is married has 4 children 10 grandchildren. There is no alcohol use. There is a prior tobacco history  ROS is negative for fevers, chills or night sweats.  He denies visual symptoms. He denies cough or sputum production. He denies dizziness. He did note mild headache. He does note some very rare recurrent episodes of chest pain but markedly improved since his catheterization and Ranexa therapy implementation. He denies wheezing. He denies significant weight loss. He denies bleeding. He denies abdominal pain. He denies nausea vomiting. He denies urinary symptoms. He denies GU symptoms. He has had 4 low back operations. He does note low back and hip discomfort which makes it difficult for him to walk. He denies paresthesias. He denies restless legs. There are no tremors. He denies myalgias. He denies  diabetes. Other comprehensive 14 system review is negative.  PE BP 138/80  Pulse 57  Ht 5' 3"  (1.6 m)  Wt 187 lb 3.2 oz (84.913 kg)  BMI 33.17 kg/m2  General: Alert, oriented, no distress.  Skin: normal turgor, no rashes HEENT: Normocephalic, atraumatic. Pupils round and reactive; sclera anicteric;no lid lag.  Nose without nasal septal hypertrophy Mouth/Parynx benign; Mallinpatti scale 3 Neck: No JVD, no carotid bruits with normal carotid Lungs: clear to ausculatation and percussion; no wheezing or rales Chest wall: Nontender to palpation Heart: RRR, s1 s2 normal 1/6 systolic murmur at the Abdomen: soft, nontender; no hepatosplenomehaly, BS+; abdominal aorta nontender and not dilated by palpation. Pulses 2+ ; specifically, distal pulses are 2+. Back: No CVA tenderness  Extremities: no clubbing cyanosis or edema, Homan's sign negative  Neurologic: grossly nonfocal Psychological: Normal affect and mood  ECG (independently read by me as: Sinus rhythm at 57 beats per minute.  T wave abnormality in V4 through V6 and mildly in 1 and L. which have been present previously, but perhaps may be slightly increased in V5, V6, compared to prior tracing.  Prior 03/16/2013 ECG: Sinus bradycardia at 57 beats per minute. Nonspecific ST abnormalities.  QTc interval 455 msec.  LABS:  BMET    Component Value Date/Time   NA 138 03/12/2013 0806   K 4.1 03/12/2013 0806   CL 107 03/12/2013 0806   CO2 26 03/12/2013 0806   GLUCOSE 105* 03/12/2013 0806   BUN 14 03/12/2013 0806   CREATININE 0.98 03/12/2013 0806   CREATININE 0.91 10/25/2009 0535   CALCIUM 9.0 03/12/2013 0806   GFRNONAA >60 10/25/2009 0535   GFRAA  Value: >60        The eGFR has been calculated using the MDRD equation. This calculation has not been validated in all clinical situations. eGFR's persistently <60 mL/min signify possible Chronic Kidney Disease. 10/25/2009 0535     Hepatic Function Panel     Component Value Date/Time   PROT  6.3 03/12/2013 0806   ALBUMIN 4.4 03/12/2013 0806   AST 17 03/12/2013 0806   ALT 18 03/12/2013 0806   ALKPHOS 90 03/12/2013 0806   BILITOT 0.6 03/12/2013 0806     CBC    Component Value Date/Time   WBC 7.2 03/12/2013 0803   RBC 4.63 03/12/2013 0803   HGB 14.5 03/12/2013 0803   HCT 41.7 03/12/2013 0803   PLT 174 03/12/2013 0803   MCV 90.1 03/12/2013 0803   MCH 31.3 03/12/2013 0803   MCHC 34.8 03/12/2013 0803   RDW 15.1 03/12/2013 0803   LYMPHSABS 2.0 02/29/2008 1215   MONOABS 0.4 02/29/2008 1215   EOSABS 0.3 02/29/2008 1215   BASOSABS 0.1 02/29/2008 1215     BNP No results found for this basename: probnp    Lipid Panel     Component Value Date/Time   CHOL 118 11/27/2012 0830   TRIG 143 11/27/2012 0830   HDL 23* 11/27/2012 0830   CHOLHDL 5.1 11/27/2012 0830   VLDL 29 11/27/2012 0830   LDLCALC 66 11/27/2012 0830     RADIOLOGY: Dg Chest 2 View  11/27/2012   *RADIOLOGY REPORT*  Clinical Data: Chest pain and shortness of breath.  Preop cardiac cath.  CHEST - 2 VIEW  Comparison: 10/21/2009.  Findings: The cardiac silhouette, mediastinal and hilar contours are normal and stable.  Stable surgical changes from bypass surgery.  The lungs are clear.  No pleural effusion.  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  IMPRESSION: No acute cardiopulmonary findings.   Original Report Authenticated By: Marijo Sanes, M.D.      ASSESSMENT AND PLAN: Mr. Eckert has established coronary artery disease and suffered initial myocardial infarction initial myocardial infarction 22 years ago. His most recent cardiac catheterization has shown patent grafts with previously demonstrated old sequential limb occlusion of the graft from the PDA to the PLA vessel. He does have potential sources of native vessel ischemia not supplied well by the grafts. He has noticed a marked improvement with the initiation of Ranexa but instead of taking 1000 mm twice a day he has been taking  only 500 milligrams in the morning and 1000 mg at night.  I am recommending further titration of his Ranexa back to  1000 g twice a day.  I will also further titrate his isosorbide mononitrate 90 mg to see if we can eliminate all of his anginal symptoms.  He does have difficulty with hip discomfort and back surgeries.  I am recommending complete set of laboratory be obtained.  His blood pressure today is controlled on lisinopril 20 mg, Lopressor, 25 mg twice a day.  He takes atorvastatin 40 mg daily for hyperlipidemia, as well as protonic for GERD.  Adjustments were made to his medical regimen if necessary.  I will see him in 3 months for cardiology reevaluation.  Troy Sine, MD, [facility]  09/16/2013 9:17 PM

## 2013-10-27 ENCOUNTER — Telehealth: Payer: Self-pay | Admitting: Cardiovascular Disease

## 2013-10-27 ENCOUNTER — Encounter: Payer: Self-pay | Admitting: Cardiovascular Disease

## 2013-10-29 NOTE — Telephone Encounter (Signed)
Closed encounter °

## 2013-12-06 ENCOUNTER — Ambulatory Visit: Payer: Medicare Other | Admitting: Cardiovascular Disease

## 2013-12-12 ENCOUNTER — Other Ambulatory Visit: Payer: Self-pay | Admitting: *Deleted

## 2013-12-12 ENCOUNTER — Telehealth: Payer: Self-pay | Admitting: Cardiovascular Disease

## 2013-12-12 MED ORDER — METOPROLOL TARTRATE 25 MG PO TABS: 25.0000 mg | ORAL_TABLET | Freq: Two times a day (BID) | ORAL | Status: DC

## 2013-12-12 MED ORDER — RANOLAZINE ER 1000 MG PO TB12: 1000.0000 mg | ORAL_TABLET | Freq: Two times a day (BID) | ORAL | Status: DC

## 2013-12-12 NOTE — Telephone Encounter (Signed)
Robert Melton is calling because Robert Melton has reached the donut hole on his medicare prescription part and wants to know if there is anything else that he can take besides Ranexa because of the cost . Also he needs a new prescription for his Metoprolol 25mg  ., Please call   Thanks

## 2013-12-12 NOTE — Telephone Encounter (Signed)
Returned a call to patient's wife informing her that Dr. Claiborne Billings is out of the office this week working in the hospital. I will go ahead and refill the lopressor , however will need for them to discuss the Ranexa change at his August appointment. Informed her that I will leave enough samples for him to take until his appointment.  Their  request for the medication change can be discussed at that time . Wife agreed with the plan.

## 2013-12-14 LAB — LIPID PANEL
Cholesterol: 139 mg/dL (ref 0–200)
HDL: 28 mg/dL — ABNORMAL LOW (ref >39)
LDL Cholesterol: 69 mg/dL (ref 0–99)
Total CHOL/HDL Ratio: 5 Ratio
Triglycerides: 209 mg/dL — ABNORMAL HIGH (ref <150)
VLDL: 42 mg/dL — ABNORMAL HIGH (ref 0–40)

## 2013-12-14 LAB — CBC
HEMATOCRIT: 40.5 % (ref 39.0–52.0)
Hemoglobin: 13.9 g/dL (ref 13.0–17.0)
MCH: 31.6 pg (ref 26.0–34.0)
MCHC: 34.3 g/dL (ref 30.0–36.0)
MCV: 92 fL (ref 78.0–100.0)
Platelets: 149 10*3/uL — ABNORMAL LOW (ref 150–400)
RBC: 4.4 MIL/uL (ref 4.22–5.81)
RDW: 13.9 % (ref 11.5–15.5)
WBC: 6.6 10*3/uL (ref 4.0–10.5)

## 2013-12-14 LAB — COMPREHENSIVE METABOLIC PANEL
ALBUMIN: 4 g/dL (ref 3.5–5.2)
ALT: 13 U/L (ref 0–53)
AST: 14 U/L (ref 0–37)
Alkaline Phosphatase: 85 U/L (ref 39–117)
BUN: 15 mg/dL (ref 6–23)
CALCIUM: 9 mg/dL (ref 8.4–10.5)
CHLORIDE: 108 meq/L (ref 96–112)
CO2: 24 mEq/L (ref 19–32)
Creat: 0.95 mg/dL (ref 0.50–1.35)
Glucose, Bld: 94 mg/dL (ref 70–99)
POTASSIUM: 4 meq/L (ref 3.5–5.3)
Sodium: 142 mEq/L (ref 135–145)
Total Bilirubin: 0.6 mg/dL (ref 0.2–1.2)
Total Protein: 6.3 g/dL (ref 6.0–8.3)

## 2013-12-15 LAB — TSH: TSH: 2.417 u[IU]/mL (ref 0.350–4.500)

## 2014-01-09 ENCOUNTER — Encounter: Payer: Self-pay | Admitting: Cardiovascular Disease

## 2014-01-09 ENCOUNTER — Ambulatory Visit (INDEPENDENT_AMBULATORY_CARE_PROVIDER_SITE_OTHER): Payer: Medicare Other | Admitting: Cardiovascular Disease

## 2014-01-09 VITALS — BP 138/70 | HR 51 | Ht 62.0 in | Wt 184.1 lb

## 2014-01-09 DIAGNOSIS — I2510 Atherosclerotic heart disease of native coronary artery without angina pectoris: Secondary | ICD-10-CM

## 2014-01-09 DIAGNOSIS — F17200 Nicotine dependence, unspecified, uncomplicated: Secondary | ICD-10-CM

## 2014-01-09 DIAGNOSIS — I10 Essential (primary) hypertension: Secondary | ICD-10-CM

## 2014-01-09 DIAGNOSIS — I252 Old myocardial infarction: Secondary | ICD-10-CM

## 2014-01-09 DIAGNOSIS — Z720 Tobacco use: Secondary | ICD-10-CM

## 2014-01-09 DIAGNOSIS — E785 Hyperlipidemia, unspecified: Secondary | ICD-10-CM

## 2014-01-09 DIAGNOSIS — M79669 Pain in unspecified lower leg: Secondary | ICD-10-CM

## 2014-01-09 DIAGNOSIS — K219 Gastro-esophageal reflux disease without esophagitis: Secondary | ICD-10-CM

## 2014-01-09 DIAGNOSIS — M79609 Pain in unspecified limb: Secondary | ICD-10-CM

## 2014-01-09 MED ORDER — CLOPIDOGREL BISULFATE 75 MG PO TABS: 75.0000 mg | ORAL_TABLET | Freq: Every day | ORAL | Status: DC

## 2014-01-09 MED ORDER — ISOSORBIDE MONONITRATE ER 120 MG PO TB24: 120.0000 mg | ORAL_TABLET | Freq: Every day | ORAL | Status: DC

## 2014-01-09 NOTE — Progress Notes (Signed)
Patient ID: Robert Melton, male   DOB: 1951-02-03, 63 y.o.   MRN: [id number]     HPI: Robert Melton a 63 y.o. male who presents to the office today for a 16-monthfollowup cardiology evaluation.  Robert Melton established coronary artery disease and in 1993 [facility] a [facility] In December 2009 he was found to have progressive CAD and underwent CABG surgery by Dr. HRoxan Hockeywith a LIMA to the LAD, free RIMA graft to the OM1, and sequential vein graft to the PDA and PLA segment. In June 2011 repeat catheterization for recurrent chest pain showed 99% stenosis at the anastomosis of the vein graft to his right coronary artery. He also had an occluded sequential limb which had previously supplied the PLA vessel. He underwent successful PTCA of the 99% anastomosis stenosis to the PDA at that time and felt markedly improved. In June 2014, he experienced increasing episodes of recurrent anginal symptomatology. He was hospitalized with unstable angina on 11/29/2012 underwent repeat catheterization which showed low normal ejection fraction at 50%. He had significant native CAD with calcified LAD and total occlusion proximally. The was a 90% proximal circumflex stenosis with 70% stenosis in the OM1 vessel. Right coronary artery was diffusely diseased and calcified with 3040 and 70% proximal mid acute marginal stenoses as well as a 40% PLA stenosis. He had been LIMA graft to the LAD, a patent  RIMA graft to the OM1 vessel, and a patent vein graft to the PDA without evidence for restenosis at the anastomosis site but again he had an occluded sequential limb.  During that hospitalization we had increased his medical regimen. He has noticed marked improvement in his prior symptomatology but he still has experienced some episodes of chest pain. An echo Doppler revealed ejection fraction 55-60% with mild aortic valve sclerosis without stenosis, trivial tricuspid and pulmonic  regurgitation. His chest pain symptoms  significantly improved with the addition of Ranexa initially at 500 twice a day.  Subsequently, I recommended further titration to 1000 twice a day.  Since I last saw him, he has continued to experience episodes of short-lived chest discomfort.  Unfortunately, one year ago, he resumed smoking and currently is smoking approximately 1/2 pack per day.  He has not taken sublingual nitroglycerin in approximately one month, but he states he often does experience short-lived chest tightness frequently and at times on a daily basis.  He denies any breast symptomatology.  He does note discomfort with walking and attributes this to previous hip surgery.  He denies awareness of palpitations.  There is no PND or orthopnea.  I did review recent laboratory that was done 3 weeks ago.  Had normal chemistry and CBC profiles.  Lipids, however, continue to be notable for elevated triglycerides at 209, HDL 28, and VLDL at 42.  Total cholesterol was 139 and LDL cholesterol of 69.  Past Medical History  Diagnosis Date  . CAD (coronary artery disease)   . [facility] of MI (myocardial infarction) 1993    [facility]  . Hypertension   . Hyperlipidemia   . GERD (gastroesophageal reflux disease)   . Broken neck 2008    fall from ladder  . History of echocardiogram 10/29/2009    EF 577-11% LV systolic function normal; mildly sclerotic AV  . History of nuclear stress test 08/21/2008    dipyridamole; negative for ischemia; normal study   . History of Doppler ultrasound 04/13/2006  LEAs; bilat ABIs - no evidence of arterial insuff.; bilat PVRs - normal; irregular non-hemodynamically significant plaque bilat in SFA    Past Surgical History  Procedure Laterality Date  . Coronary artery bypass graft  2009    revascularization by Dr. Koleen Nimrod; LIMA to LAD, free RIMA graft to OM1, sequential vein gradt to PDA & PL segment  . Cardiac catheterization  10/24/2009    r/t CP;  significant native CAD, patent LIMA graft to LAD & patent RIMA to obtuse marginal vessel; graft to RCA 99% stenosed at anastomses; stenting to RCA reduced to 20-30%  . Lumbar spine surgery  2004    s/p fusion L4-S1; 4 back surgeries  . Parathyroidectomy  2001  . Knee surgery      Left Knee (1992), Right Knee (1998);   Marland Kitchen Carpal tunnel release  2000  . Neck fusion  1999  . Skin cancer excision  1999    face    No Known Allergies  Current Outpatient Prescriptions  Medication Sig Dispense Refill  . atorvastatin (LIPITOR) 40 MG tablet Take 40 mg by mouth daily.      Marland Kitchen docusate sodium (COLACE) 100 MG capsule Take 100 mg by mouth daily.      Marland Kitchen lisinopril (PRINIVIL,ZESTRIL) 20 MG tablet Take 20 mg by mouth daily.      Marland Kitchen loratadine (CLARITIN) 10 MG tablet Take 10 mg by mouth daily as needed for allergies.      . metoprolol tartrate (LOPRESSOR) 25 MG tablet Take 1 tablet (25 mg total) by mouth 2 (two) times daily.  180 tablet  3  . Multiple Vitamin (MULTIVITAMIN) capsule Take 1 capsule by mouth daily.      . nitroGLYCERIN (NITROSTAT) 0.4 MG SL tablet Place 0.4 mg under the tongue every 5 (five) minutes as needed for chest pain.      . pantoprazole (PROTONIX) 40 MG tablet Take 40 mg by mouth 2 (two) times daily.       . ranolazine (RANEXA) 1000 MG SR tablet Take 1 tablet (1,000 mg total) by mouth 2 (two) times daily.  70 tablet  0   No current facility-administered medications for this visit.    Socially he is married has 4 children 10 grandchildren. There is no alcohol use.  He is resuming smoking for the past year.  ROS General: Negative; No fevers, chills, or night sweats;  HEENT: Negative; No changes in vision or hearing, sinus congestion, difficulty swallowing Pulmonary: Negative; No cough, wheezing, shortness of breath, hemoptysis Cardiovascular: See history of present illness; presyncope, syncope, palpitations GI: Negative; No nausea, vomiting, diarrhea, or abdominal pain GU:  Negative; No dysuria, hematuria, or difficulty voiding Musculoskeletal: Positive for hip discomfort, but typically with; no myalgias, joint pain, or weakness Hematologic/Oncology: Negative; no easy bruising, bleeding Endocrine: Negative; no heat/cold intolerance; no diabetes Neuro: Negative; no changes in balance, headaches Skin: Negative; No rashes or skin lesions Psychiatric: Negative; No behavioral problems, depression Sleep: Negative; No snoring, daytime sleepiness, hypersomnolence, bruxism, restless legs, hypnogognic hallucinations, no cataplexy Other comprehensive 14 point system review is negative.  PE BP 138/70  Pulse 51  Ht 5' 2"  (1.575 m)  Wt 184 lb 1.6 oz (83.507 kg)  BMI 33.66 kg/m2  General: Alert, oriented, no distress.  Skin: normal turgor, no rashes HEENT: Normocephalic, atraumatic. Pupils round and reactive; sclera anicteric;no lid lag.  Nose without nasal septal hypertrophy Mouth/Parynx benign; Mallinpatti scale 3 Neck: No JVD, no carotid bruits with normal carotid Lungs: clear to ausculatation and  percussion; no wheezing or rales Chest wall: Nontender to palpation Heart: RRR, s1 s2 normal 1/6 systolic murmur at the Abdomen: soft, nontender; no hepatosplenomehaly, BS+; abdominal aorta nontender and not dilated by palpation. Pulses 2+ ; specifically, distal pulses are 2+. Back: No CVA tenderness Extremities: no clubbing cyanosis or edema, Homan's sign negative  Neurologic: grossly nonfocal Psychological: Normal affect and mood  ECG (independently read by me): Sinus bradycardia 51 beats per minute.  Procedure noted T wave abnormality V3-6.  QTc interval 45 ms.  09/13/2013 ECG (independently read by me as): Sinus rhythm at 57 beats per minute.  T wave abnormality in V4 through V6 and mildly in 1 and L. which have been present previously, but perhaps may be slightly increased in V5, V6, compared to prior tracing.  Prior 03/16/2013 ECG: Sinus bradycardia at 57 beats  per minute. Nonspecific ST abnormalities.  QTc interval 455 msec.  LABS:  BMET    Component Value Date/Time   NA 142 12/14/2013 0923   K 4.0 12/14/2013 0923   CL 108 12/14/2013 0923   CO2 24 12/14/2013 0923   GLUCOSE 94 12/14/2013 0923   BUN 15 12/14/2013 0923   CREATININE 0.95 12/14/2013 0923   CREATININE 0.91 10/25/2009 0535   CALCIUM 9.0 12/14/2013 0923   GFRNONAA >60 10/25/2009 0535   GFRAA  Value: >60        The eGFR has been calculated using the MDRD equation. This calculation has not been validated in all clinical situations. eGFR's persistently <60 mL/min signify possible Chronic Kidney Disease. 10/25/2009 0535     Hepatic Function Panel     Component Value Date/Time   PROT 6.3 12/14/2013 0923   ALBUMIN 4.0 12/14/2013 0923   AST 14 12/14/2013 0923   ALT 13 12/14/2013 0923   ALKPHOS 85 12/14/2013 0923   BILITOT 0.6 12/14/2013 0923     CBC    Component Value Date/Time   WBC 6.6 12/14/2013 0923   RBC 4.40 12/14/2013 0923   HGB 13.9 12/14/2013 0923   HCT 40.5 12/14/2013 0923   PLT 149* 12/14/2013 0923   MCV 92.0 12/14/2013 0923   MCH 31.6 12/14/2013 0923   MCHC 34.3 12/14/2013 0923   RDW 13.9 12/14/2013 0923   LYMPHSABS 2.0 02/29/2008 1215   MONOABS 0.4 02/29/2008 1215   EOSABS 0.3 02/29/2008 1215   BASOSABS 0.1 02/29/2008 1215     BNP No results found for this basename: probnp    Lipid Panel     Component Value Date/Time   CHOL 139 12/14/2013 0923   TRIG 209* 12/14/2013 0923   HDL 28* 12/14/2013 0923   CHOLHDL 5.0 12/14/2013 0923   VLDL 42* 12/14/2013 0923   LDLCALC 69 12/14/2013 0923     RADIOLOGY: Dg Chest 2 View  11/27/2012   *RADIOLOGY REPORT*  Clinical Data: Chest pain and shortness of breath.  Preop cardiac cath.  CHEST - 2 VIEW  Comparison: 10/21/2009.  Findings: The cardiac silhouette, mediastinal and hilar contours are normal and stable.  Stable surgical changes from bypass surgery.  The lungs are clear.  No pleural effusion.  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  IMPRESSION: No acute cardiopulmonary findings.   Original Report Authenticated By: Marijo Sanes, M.D.      ASSESSMENT AND PLAN: Mr. Calix has established coronary artery disease and suffered initial myocardial infarction initial myocardial infarction 22 years ago. His most recent cardiac catheterization has shown patent grafts with previously demonstrated old sequential limb occlusion  of the graft from the PDA to the PLA vessel. He does have potential sources of native vessel ischemia not supplied well by the grafts.  He has noticed significant improvement in his symptoms with the addition of Ranexa and, now he's been taking the maximum dose at 1000 mg twice a day.  He still experiences episodes of chest discomfort.  I am further titrating his isosorbide mononitrate to 120 mg.  With his chest pain occurring fairly frequently, I also suggested reinitiation of Plavix 75 mg and he will reduce his aspirin to 81 mg.  He does experience hip discomfort with walking.  This may very well be representative of peripheral vascular disease, particularly with his concomitant coronary obstructive disease.  I'm scheduling him to undergo lower extremity arterial Doppler studies for further assessment.  I will discuss with him regarding the importance of complete tobacco cessation.  His most recent lipid panel does demonstrate elevation of triglycerides with a low HDL level and elevation of VLDL.  Remotely he was unable to tolerate niacin preparation due to flushing.  He currently is on atorvastatin 40 mg and I am recommending the addition of fish oil 2 capsules daily.  I will see him in 3 months for followup evaluation or sooner if recurrent symptomatology does not improve.    Troy Sine, MD, [facility]  01/09/2014 10:48 AM

## 2014-01-09 NOTE — Patient Instructions (Signed)
  Your physician recommends that you schedule a follow-up appointment in: 3 Months  Your physician has recommended you make the following change in your medication: Start Plavix 75 mg daily, Increase Imdur to 120 mg daily and decrease Aspirin to 81 mg daily, and fish oil over the counter 2 capsules daily  Your physician has requested that you have a lower extremity arterial duplex. This test is an ultrasound of the arteries in the legs. It looks at arterial blood flow in the legs. Allow one hour for Lower Arterial scans. There are no restrictions or special instructions

## 2014-01-22 ENCOUNTER — Ambulatory Visit (HOSPITAL_COMMUNITY)
Admission: RE | Admit: 2014-01-22 | Discharge: 2014-01-22 | Disposition: A | Discharge status: Home / Self Care | Payer: Medicare Other | Source: Ambulatory Visit | Attending: Cardiovascular Disease | Admitting: Cardiovascular Disease

## 2014-01-22 DIAGNOSIS — I70219 Atherosclerosis of native arteries of extremities with intermittent claudication, unspecified extremity: Secondary | ICD-10-CM | POA: Diagnosis present

## 2014-01-22 DIAGNOSIS — M79609 Pain in unspecified limb: Secondary | ICD-10-CM

## 2014-01-22 DIAGNOSIS — M79669 Pain in unspecified lower leg: Secondary | ICD-10-CM

## 2014-01-22 NOTE — Progress Notes (Signed)
Arterial Duplex Lower Ext. Completed. Esbeidy Mclaine, BS, RDMS, RVT  

## 2014-01-29 ENCOUNTER — Encounter: Payer: Self-pay | Admitting: *Deleted

## 2014-02-01 ENCOUNTER — Telehealth: Payer: Self-pay | Admitting: Cardiovascular Disease

## 2014-02-01 NOTE — Telephone Encounter (Signed)
Pt called and wanted to know if he could come off of his blood thinner and take Aspirin 3.25 so that he can get a colonoscopy. Please call  Thanks

## 2014-02-01 NOTE — Telephone Encounter (Signed)
Hold ASA and plavix for 5 days prior to procedure

## 2014-02-01 NOTE — Telephone Encounter (Signed)
Returned call to patient's cell phone. Informed him he can hold ASA and plavix for 5 days prior to procedure. Asked if this information needed to be communicated via fax to MD performing colonoscopy and he is not aware of this need at this time.

## 2014-02-01 NOTE — Telephone Encounter (Signed)
Returned call - spoke with patient's wife. He is supposed to have colonoscopy before 10/2 (GI appointment with Dr. Rebecka Apley? affiliated with [facility]).   Needs to know about holding ASA and plavix.

## 2014-02-15 DIAGNOSIS — R222 Localized swelling, mass and lump, trunk: Secondary | ICD-10-CM | POA: Insufficient documentation

## 2014-03-04 ENCOUNTER — Telehealth: Payer: Self-pay | Admitting: Cardiovascular Disease

## 2014-03-04 MED ORDER — RANOLAZINE ER 1000 MG PO TB12: 1000.0000 mg | ORAL_TABLET | Freq: Two times a day (BID) | ORAL | Status: DC

## 2014-03-04 NOTE — Telephone Encounter (Signed)
Pt would like some samples of Ranexa 1000 mg please.

## 2014-03-04 NOTE — Telephone Encounter (Signed)
Patient/wife notified samples at front desk

## 2014-03-11 ENCOUNTER — Telehealth: Payer: Self-pay | Admitting: Cardiovascular Disease

## 2014-03-11 NOTE — Telephone Encounter (Signed)
Pt need clarence to stop his aspirin and Plavix. Dr. Learta Codding office said they had faxed clarence over here,but had not heard back from it.His colonoscopy is scheduled for 03-20-14.

## 2014-03-11 NOTE — Telephone Encounter (Signed)
WILL DEFER TO DR Claiborne Billings  FORWARD TO DR KELLY/  [facility] CMA

## 2014-03-13 NOTE — Telephone Encounter (Signed)
She is still waiting for his clarence. Procedure is scheduled for 03-20-14. He need to stop his Plavix.Please fax [phone].

## 2014-03-13 NOTE — Telephone Encounter (Signed)
Awaiting Dr. Claiborne Billings to clear patient.

## 2014-03-14 NOTE — Telephone Encounter (Signed)
Patient cleared by Dr. Claiborne Billings on 03/14/14. Note faxed to [facility] specialty care @ [phone].

## 2014-04-02 ENCOUNTER — Ambulatory Visit (INDEPENDENT_AMBULATORY_CARE_PROVIDER_SITE_OTHER): Payer: Medicare Other | Admitting: Cardiovascular Disease

## 2014-04-02 ENCOUNTER — Encounter: Payer: Self-pay | Admitting: Cardiovascular Disease

## 2014-04-02 VITALS — BP 126/70 | HR 51 | Ht 63.0 in | Wt 177.0 lb

## 2014-04-02 DIAGNOSIS — I252 Old myocardial infarction: Secondary | ICD-10-CM

## 2014-04-02 DIAGNOSIS — E66811 Obesity, class 1: Secondary | ICD-10-CM

## 2014-04-02 DIAGNOSIS — I25118 Atherosclerotic heart disease of native coronary artery with other forms of angina pectoris: Secondary | ICD-10-CM

## 2014-04-02 DIAGNOSIS — I10 Essential (primary) hypertension: Secondary | ICD-10-CM

## 2014-04-02 DIAGNOSIS — Z720 Tobacco use: Secondary | ICD-10-CM

## 2014-04-02 DIAGNOSIS — E785 Hyperlipidemia, unspecified: Secondary | ICD-10-CM

## 2014-04-02 DIAGNOSIS — E669 Obesity, unspecified: Secondary | ICD-10-CM

## 2014-04-02 MED ORDER — AMLODIPINE BESYLATE 5 MG PO TABS: 5.0000 mg | ORAL_TABLET | Freq: Every day | ORAL | Status: DC

## 2014-04-02 NOTE — Progress Notes (Signed)
Patient ID: Robert Melton, male   DOB: 05-Aug-1950, 63 y.o.   MRN: [id number]     HPI: Robert Melton  is a 63 y.o. male who presents to the office today for a 107-monthfollowup cardiology evaluation.  [name] known CAD and in 1993 suffered a [facility] In December 2009 he was found to have progressive CAD and underwent CABG surgery by Dr. HRoxan Hockey(LIMA to the LAD, free RIMA graft to the OM1, and sequential vein graft to the PDA and PLA segment). In June 2011 repeat catheterization for recurrent chest pain showed 99% stenosis at the anastomosis of the vein graft to his right coronary artery. He also had an occluded sequential limb which had previously supplied the PLA vessel. He underwent successful PTCA of the 99% anastomosis stenosis to the PDA at that time and felt markedly improved. In June 2014, he experienced increasing episodes of recurrent anginal symptomatology. He was hospitalized with unstable angina on 11/29/2012 underwent repeat catheterization which showed low normal ejection fraction at 50%. He had significant native CAD with calcified LAD and total occlusion proximally. The was a 90% proximal circumflex stenosis with 70% stenosis in the OM1 vessel. Right coronary artery was diffusely diseased and calcified with 3040 and 70% proximal mid acute marginal stenoses as well as a 40% PLA stenosis. He had been LIMA graft to the LAD, a patent  RIMA graft to the OM1 vessel, and a patent vein graft to the PDA without evidence for restenosis at the anastomosis site but again he had an occluded sequential limb.  During that hospitalization we had increased his medical regimen. He has noticed marked improvement in his prior symptomatology but he still has experienced some episodes of chest pain. An echo Doppler revealed ejection fraction 55-60% with mild aortic valve sclerosis without stenosis, trivial tricuspid and pulmonic regurgitation. His chest pain  symptoms  significantly improved with the addition of Ranexa initially at 500 twice a day.  Subsequently, I recommended further titration to 1000 twice a day.  When I last saw him he had continued to experience episodes of short-lived chest discomfort.  Unfortunately, one year ago, he resumed smoking and currently is smoking approximately 1/2 pack per day.  He has not taken sublingual nitroglycerin in approximately one month, but he states he often does experience short-lived chest tightness frequently and at times on a daily basis.  He denies any breast symptomatology.  He does note discomfort with walking and attributes this to previous hip surgery.  He denies awareness of palpitations.  There is no PND or orthopnea.  Laboratory  From this summer revealed anormal chemistry and CBC profiles.  Lipids studies revealed  elevated triglycerides at 209, HDL 28, and VLDL at 42.  Total cholesterol was 139 and LDL cholesterol of 69.  As I last saw him, he admits to still smoking.  He does note occasional hip discomfort which has limited his activity and ability to walk.  He does note some mild episodes of some chest fullness.  He has been on dual antiplatelet therapy with aspirin and Plavix, isosorbide 120 g in the morning, metoprolol, tartrate 25 mg twice a day, Ranexa 1000 g twice a day in addition to his lisinopril 20 mg.  He presents for evaluation.  Past Medical History  Diagnosis Date  . CAD (coronary artery disease)   . [facility] of MI (myocardial infarction) 1993    [facility]  . Hypertension   .  Hyperlipidemia   . GERD (gastroesophageal reflux disease)   . Broken neck 2008    fall from ladder  . History of echocardiogram 10/29/2009    EF 80-99%; LV systolic function normal; mildly sclerotic AV  . History of nuclear stress test 08/21/2008    dipyridamole; negative for ischemia; normal study   . History of Doppler ultrasound 04/13/2006    LEAs; bilat ABIs - no evidence of arterial  insuff.; bilat PVRs - normal; irregular non-hemodynamically significant plaque bilat in SFA    Past Surgical History  Procedure Laterality Date  . Coronary artery bypass graft  2009    revascularization by Dr. Koleen Nimrod; LIMA to LAD, free RIMA graft to OM1, sequential vein gradt to PDA & PL segment  . Cardiac catheterization  10/24/2009    r/t CP; significant native CAD, patent LIMA graft to LAD & patent RIMA to obtuse marginal vessel; graft to RCA 99% stenosed at anastomses; stenting to RCA reduced to 20-30%  . Lumbar spine surgery  2004    s/p fusion L4-S1; 4 back surgeries  . Parathyroidectomy  2001  . Knee surgery      Left Knee (1992), Right Knee (1998);   Marland Kitchen Carpal tunnel release  2000  . Neck fusion  1999  . Skin cancer excision  1999    face    No Known Allergies  Current Outpatient Prescriptions  Medication Sig Dispense Refill  . aspirin EC 81 MG tablet Take 81 mg by mouth daily.    Marland Kitchen atorvastatin (LIPITOR) 40 MG tablet Take 40 mg by mouth daily.    . clopidogrel (PLAVIX) 75 MG tablet Take 1 tablet (75 mg total) by mouth daily. 30 tablet 6  . docusate sodium (COLACE) 100 MG capsule Take 100 mg by mouth daily.    . isosorbide mononitrate (IMDUR) 120 MG 24 hr tablet Take 1 tablet (120 mg total) by mouth daily. 30 tablet 6  . lisinopril (PRINIVIL,ZESTRIL) 20 MG tablet Take 20 mg by mouth daily.    Marland Kitchen loratadine (CLARITIN) 10 MG tablet Take 10 mg by mouth daily as needed for allergies.    . metoprolol tartrate (LOPRESSOR) 25 MG tablet Take 1 tablet (25 mg total) by mouth 2 (two) times daily. 180 tablet 3  . Multiple Vitamin (MULTIVITAMIN) capsule Take 1 capsule by mouth daily.    . nitroGLYCERIN (NITROSTAT) 0.4 MG SL tablet Place 0.4 mg under the tongue every 5 (five) minutes as needed for chest pain.    . pantoprazole (PROTONIX) 40 MG tablet Take 40 mg by mouth daily.     . ranolazine (RANEXA) 1000 MG SR tablet Take 1 tablet (1,000 mg total) by mouth 2 (two) times daily. 42  tablet 0   No current facility-administered medications for this visit.    Socially he is married has 4 children 10 grandchildren. There is no alcohol use.  He is resuming smoking for the past year.  ROS General: Negative; No fevers, chills, or night sweats;  HEENT: Negative; No changes in vision or hearing, sinus congestion, difficulty swallowing Pulmonary: Negative; No cough, wheezing, shortness of breath, hemoptysis Cardiovascular: See history of present illness; presyncope, syncope, palpitations GI: Negative; No nausea, vomiting, diarrhea, or abdominal pain GU: Negative; No dysuria, hematuria, or difficulty voiding Musculoskeletal: Positive for hip discomfort, but typically with; no myalgias, joint pain, or weakness Hematologic/Oncology: Negative; no easy bruising, bleeding Endocrine: Negative; no heat/cold intolerance; no diabetes Neuro: Negative; no changes in balance, headaches Skin: Negative; No rashes or skin lesions  Psychiatric: Negative; No behavioral problems, depression Sleep: Negative; No snoring, daytime sleepiness, hypersomnolence, bruxism, restless legs, hypnogognic hallucinations, no cataplexy Other comprehensive 14 point system review is negative.  PE BP 126/70 mmHg  Pulse 51  Ht 5' 3"  (1.6 m)  Wt 177 lb (80.287 kg)  BMI 31.36 kg/m2  General: Alert, oriented, no distress.  Skin: normal turgor, no rashes HEENT: Normocephalic, atraumatic. Pupils round and reactive; sclera anicteric;no lid lag.  Nose without nasal septal hypertrophy Mouth/Parynx benign; Mallinpatti scale 3 Neck: No JVD, no carotid bruits with normal carotid Lungs: clear to ausculatation and percussion; no wheezing or rales Chest wall: Nontender to palpation Heart: RRR, s1 s2 normal 1/6 systolic murmur, no diastolic murmur.  No S3 or S4 gallop.  No rubs thrills or heaves. Abdomen: soft, nontender; no hepatosplenomehaly, BS+; abdominal aorta nontender and not dilated by palpation. Pulses 2+ ;  specifically, distal pulses are 2+. Back: No CVA tenderness Extremities: no clubbing cyanosis or edema, Homan's sign negative  Neurologic: grossly nonfocal Psychological: Normal affect and mood   ECG (independently read by me): Sinus bradycardia 51 bpm.  ST-T wave changes laterally  V3 through V6.  QTc interval 409 ms.  ECG (independently read by me): Sinus bradycardia 51 beats per minute.  Procedure noted T wave abnormality V3-6.  QTc interval normal  09/13/2013 ECG (independently read by me as): Sinus rhythm at 57 beats per minute.  T wave abnormality in V4 through V6 and mildly in 1 and L. which have been present previously, but perhaps may be slightly increased in V5, V6, compared to prior tracing.  Prior 03/16/2013 ECG: Sinus bradycardia at 57 beats per minute. Nonspecific ST abnormalities.  QTc interval 455 msec.  LABS:  BMET    Component Value Date/Time   NA 142 12/14/2013 0923   K 4.0 12/14/2013 0923   CL 108 12/14/2013 0923   CO2 24 12/14/2013 0923   GLUCOSE 94 12/14/2013 0923   BUN 15 12/14/2013 0923   CREATININE 0.95 12/14/2013 0923   CREATININE 0.91 10/25/2009 0535   CALCIUM 9.0 12/14/2013 0923   GFRNONAA >60 10/25/2009 0535   GFRAA  10/25/2009 0535    >60        The eGFR has been calculated using the MDRD equation. This calculation has not been validated in all clinical situations. eGFR's persistently <60 mL/min signify possible Chronic Kidney Disease.     Hepatic Function Panel     Component Value Date/Time   PROT 6.3 12/14/2013 0923   ALBUMIN 4.0 12/14/2013 0923   AST 14 12/14/2013 0923   ALT 13 12/14/2013 0923   ALKPHOS 85 12/14/2013 0923   BILITOT 0.6 12/14/2013 0923     CBC    Component Value Date/Time   WBC 6.6 12/14/2013 0923   RBC 4.40 12/14/2013 0923   HGB 13.9 12/14/2013 0923   HCT 40.5 12/14/2013 0923   PLT 149* 12/14/2013 0923   MCV 92.0 12/14/2013 0923   MCH 31.6 12/14/2013 0923   MCHC 34.3 12/14/2013 0923   RDW 13.9  12/14/2013 0923   LYMPHSABS 2.0 02/29/2008 1215   MONOABS 0.4 02/29/2008 1215   EOSABS 0.3 02/29/2008 1215   BASOSABS 0.1 02/29/2008 1215     BNP No results found for: PROBNP  Lipid Panel     Component Value Date/Time   CHOL 139 12/14/2013 0923   TRIG 209* 12/14/2013 0923   HDL 28* 12/14/2013 0923   CHOLHDL 5.0 12/14/2013 0923   VLDL 42* 12/14/2013 0923   LDLCALC  69 12/14/2013 0923     RADIOLOGY: Dg Chest 2 View  11/27/2012   *RADIOLOGY REPORT*  Clinical Data: Chest pain and shortness of breath.  Preop cardiac cath.  CHEST - 2 VIEW  Comparison: 10/21/2009.  Findings: The cardiac silhouette, mediastinal and hilar contours are normal and stable.  Stable surgical changes from bypass surgery.  The lungs are clear.  No pleural effusion.  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  IMPRESSION: No acute cardiopulmonary findings.   Original Report Authenticated By: Marijo Sanes, M.D.      ASSESSMENT AND PLAN: Robert Melton is a 63 year old male who has established coronary artery disease and suffered initial myocardial infarction  22 years ago. His most recent cardiac catheterization has shown patent grafts with previously demonstrated old sequential limb occlusion of the graft from the PDA to the PLA vessel. He does have potential sources of native vessel ischemia not supplied well by the grafts.  He has noticed significant improvement in his symptoms with the addition of Ranexa and, now he's been taking the maximum dose at 1000 mg twice a day.  He still experiences episodes of chest discomfort.  I last saw him, I  further titrated his isosorbide mononitrate to 120 mg.  With his chest pain occurring fairly frequently, I also suggested reinitiation of Plavix 75 mg and reduced his aspirin to 81 mg. I will now and amlodipine 5 mg to his medical regimen, which will be helpful both for blood pressure as well as anginal symptoms.  He does experience hip discomfort with  walking.  This may very well be representative of peripheral vascular disease, particularly with his concomitant coronary obstructive disease.  I had recommended that he undergoa lower extremity arterial Doppler studies for further assessment.  I again discussed with him the importance of complete tobacco cessation.  His most recent lipid panel does demonstrate elevation of triglycerides with a low HDL level and elevation of VLDL.  Remotely he was unable to tolerate niacin preparation due to flushing.  He currently is on atorvastatin 40 mg and I had recommended the addition of fish oil 2 capsules daily.  I am recommending follow-up laboratory be obtained and further adjustments will be made to his medical regimen.  I will see him in approximate 4 months for cardiology reevaluation.  Time spent: 30 minutes   Troy Sine, MD, [facility]  04/02/2014 10:25 AM

## 2014-04-02 NOTE — Patient Instructions (Signed)
Your physician has recommended you make the following change in your medication: start new prescription for amlodipine 5 mg daily . This prescription has already been sent to your pharmacy.  Your physician wants you to follow-up in: 5 months with or sooner with Dr. Claiborne Billings. You will receive a reminder letter in the mail two months in advance. If you don't receive a letter, please call our office to schedule the follow-up appointment.  Your physician recommends that you return for lab work in: 5 months. Lab orders will be sent to you to have this done.

## 2014-04-04 ENCOUNTER — Encounter: Payer: Self-pay | Admitting: Cardiovascular Disease

## 2014-04-16 DIAGNOSIS — I219 Acute myocardial infarction, unspecified: Secondary | ICD-10-CM

## 2014-04-16 HISTORY — DX: Acute myocardial infarction, unspecified: I21.9

## 2014-04-25 ENCOUNTER — Encounter (HOSPITAL_COMMUNITY): Payer: Self-pay | Admitting: Cardiovascular Disease

## 2014-04-27 DIAGNOSIS — R0902 Hypoxemia: Secondary | ICD-10-CM | POA: Insufficient documentation

## 2014-04-27 DIAGNOSIS — I214 Non-ST elevation (NSTEMI) myocardial infarction: Secondary | ICD-10-CM | POA: Diagnosis present

## 2014-04-28 ENCOUNTER — Inpatient Hospital Stay (HOSPITAL_COMMUNITY)
Admission: AD | Admit: 2014-04-28 | Discharge: 2014-04-30 | DRG: 281 | Disposition: A | Discharge status: Home / Self Care | Payer: Medicare Other | Source: Other Acute Inpatient Hospital | Attending: Cardiovascular Disease | Admitting: Cardiovascular Disease

## 2014-04-28 DIAGNOSIS — Z6830 Body mass index (BMI) 30.0-30.9, adult: Secondary | ICD-10-CM

## 2014-04-28 DIAGNOSIS — Z7982 Long term (current) use of aspirin: Secondary | ICD-10-CM | POA: Diagnosis not present

## 2014-04-28 DIAGNOSIS — E039 Hypothyroidism, unspecified: Secondary | ICD-10-CM | POA: Diagnosis present

## 2014-04-28 DIAGNOSIS — F17210 Nicotine dependence, cigarettes, uncomplicated: Secondary | ICD-10-CM | POA: Diagnosis present

## 2014-04-28 DIAGNOSIS — I214 Non-ST elevation (NSTEMI) myocardial infarction: Principal | ICD-10-CM | POA: Diagnosis present

## 2014-04-28 DIAGNOSIS — J80 Acute respiratory distress syndrome: Secondary | ICD-10-CM | POA: Diagnosis present

## 2014-04-28 DIAGNOSIS — K219 Gastro-esophageal reflux disease without esophagitis: Secondary | ICD-10-CM | POA: Diagnosis present

## 2014-04-28 DIAGNOSIS — E66811 Obesity, class 1: Secondary | ICD-10-CM | POA: Diagnosis present

## 2014-04-28 DIAGNOSIS — I10 Essential (primary) hypertension: Secondary | ICD-10-CM | POA: Diagnosis present

## 2014-04-28 DIAGNOSIS — E785 Hyperlipidemia, unspecified: Secondary | ICD-10-CM | POA: Diagnosis present

## 2014-04-28 DIAGNOSIS — J449 Chronic obstructive pulmonary disease, unspecified: Secondary | ICD-10-CM | POA: Diagnosis present

## 2014-04-28 DIAGNOSIS — Z720 Tobacco use: Secondary | ICD-10-CM | POA: Diagnosis present

## 2014-04-28 DIAGNOSIS — R0602 Shortness of breath: Secondary | ICD-10-CM

## 2014-04-28 DIAGNOSIS — I252 Old myocardial infarction: Secondary | ICD-10-CM

## 2014-04-28 DIAGNOSIS — E669 Obesity, unspecified: Secondary | ICD-10-CM | POA: Diagnosis present

## 2014-04-28 DIAGNOSIS — I2510 Atherosclerotic heart disease of native coronary artery without angina pectoris: Secondary | ICD-10-CM

## 2014-04-28 DIAGNOSIS — I25110 Atherosclerotic heart disease of native coronary artery with unstable angina pectoris: Secondary | ICD-10-CM | POA: Diagnosis present

## 2014-04-28 DIAGNOSIS — R0902 Hypoxemia: Secondary | ICD-10-CM

## 2014-04-28 DIAGNOSIS — J189 Pneumonia, unspecified organism: Secondary | ICD-10-CM

## 2014-04-28 HISTORY — DX: Acute myocardial infarction, unspecified: I21.9

## 2014-04-28 HISTORY — DX: Unspecified osteoarthritis, unspecified site: M19.90

## 2014-04-28 MED ORDER — ASPIRIN EC 81 MG PO TBEC: 81.0000 mg | DELAYED_RELEASE_TABLET | Freq: Every day | ORAL | Status: DC

## 2014-04-28 MED ORDER — ACETAMINOPHEN 325 MG PO TABS: 650.0000 mg | ORAL_TABLET | ORAL | Status: DC | PRN

## 2014-04-28 MED ORDER — DOCUSATE SODIUM 100 MG PO CAPS
100.0000 mg | ORAL_CAPSULE | Freq: Every day | ORAL | Status: DC
  Administered 2014-04-29 – 2014-04-30 (×2): 100 mg via ORAL
  Filled 2014-04-28 (×2): qty 1

## 2014-04-28 MED ORDER — PANTOPRAZOLE SODIUM 40 MG PO TBEC
40.0000 mg | DELAYED_RELEASE_TABLET | Freq: Every day | ORAL | Status: DC
  Administered 2014-04-29 – 2014-04-30 (×2): 40 mg via ORAL
  Filled 2014-04-28 (×2): qty 1

## 2014-04-28 MED ORDER — NITROGLYCERIN 0.4 MG SL SUBL
0.4000 mg | SUBLINGUAL_TABLET | SUBLINGUAL | Status: DC | PRN
Start: 2014-04-28 — End: 2014-04-30

## 2014-04-28 MED ORDER — LISINOPRIL 20 MG PO TABS
20.0000 mg | ORAL_TABLET | Freq: Every day | ORAL | Status: DC
  Administered 2014-04-29 – 2014-04-30 (×2): 20 mg via ORAL
  Filled 2014-04-28 (×2): qty 1

## 2014-04-28 MED ORDER — AMLODIPINE BESYLATE 5 MG PO TABS
5.0000 mg | ORAL_TABLET | Freq: Every day | ORAL | Status: DC
  Administered 2014-04-29 – 2014-04-30 (×2): 5 mg via ORAL
  Filled 2014-04-28 (×2): qty 1

## 2014-04-28 MED ORDER — ATORVASTATIN CALCIUM 40 MG PO TABS
40.0000 mg | ORAL_TABLET | Freq: Every day | ORAL | Status: DC
  Administered 2014-04-28 – 2014-04-30 (×3): 40 mg via ORAL
  Filled 2014-04-28 (×3): qty 1

## 2014-04-28 MED ORDER — ASPIRIN EC 81 MG PO TBEC
81.0000 mg | DELAYED_RELEASE_TABLET | Freq: Every day | ORAL | Status: DC
  Administered 2014-04-29: 81 mg via ORAL
  Filled 2014-04-28: qty 1

## 2014-04-28 MED ORDER — METOPROLOL TARTRATE 25 MG PO TABS
25.0000 mg | ORAL_TABLET | Freq: Two times a day (BID) | ORAL | Status: DC
  Administered 2014-04-28 – 2014-04-30 (×3): 25 mg via ORAL
  Filled 2014-04-28 (×3): qty 1

## 2014-04-28 MED ORDER — ONDANSETRON HCL 4 MG/2ML IJ SOLN: 4.0000 mg | Freq: Four times a day (QID) | INTRAMUSCULAR | Status: DC | PRN

## 2014-04-28 MED ORDER — LORATADINE 10 MG PO TABS: 10.0000 mg | ORAL_TABLET | Freq: Every day | ORAL | Status: DC | PRN

## 2014-04-28 MED ORDER — RANOLAZINE ER 500 MG PO TB12
1000.0000 mg | ORAL_TABLET | Freq: Two times a day (BID) | ORAL | Status: DC
  Administered 2014-04-28 – 2014-04-30 (×3): 1000 mg via ORAL
  Filled 2014-04-28 (×3): qty 2

## 2014-04-28 MED ORDER — CLOPIDOGREL BISULFATE 75 MG PO TABS
75.0000 mg | ORAL_TABLET | Freq: Every day | ORAL | Status: DC
  Administered 2014-04-29 – 2014-04-30 (×2): 75 mg via ORAL
  Filled 2014-04-28 (×2): qty 1

## 2014-04-28 MED ORDER — HEPARIN (PORCINE) IN NACL 100-0.45 UNIT/ML-% IJ SOLN
1150.0000 [IU]/h | INTRAMUSCULAR | Status: DC
  Administered 2014-04-29: 1150 [IU]/h via INTRAVENOUS
  Filled 2014-04-28: qty 250

## 2014-04-28 MED ORDER — ISOSORBIDE MONONITRATE ER 60 MG PO TB24
120.0000 mg | ORAL_TABLET | Freq: Every day | ORAL | Status: DC
  Administered 2014-04-29 – 2014-04-30 (×2): 120 mg via ORAL
  Filled 2014-04-28 (×2): qty 2

## 2014-04-28 MED ORDER — NITROGLYCERIN 0.4 MG SL SUBL: 0.4000 mg | SUBLINGUAL_TABLET | SUBLINGUAL | Status: DC | PRN

## 2014-04-28 NOTE — H&P (Signed)
History and Physical   Admit date: 04/28/2014 Name:  Robert Melton Medical record number: [id number] DOB/Age:  1950/12/23  63 y.o. male  Primary Cardiologist:  Dr. Shelva Majestic  Primary Physician: Dr. Alean Rinne  Chief complaint/reason for admission: Abnormal troponins, coronary artery disease, recent shortness of breath  HPI:   Robert Melton is a 63 y.o. male with PMHX of coronary artery disease status post multiple interventions and myocardial infarction who presented to [facility] on Friday with shortness of breath.  He states that on the day of presentation to the ED, he had just stepped out of the shower in the late afternoon and had a sudden sensation of warmth with "head to toe" diaphoresis to where he was soaking wet and had to remove his clothing, associated with dizziness, mild nausea and urge to defecate that were both transient , and had shortness of breath with wheezing. notably, he did not have any chest discomfort during this time besides the shortness of breath, he remarks that this is very different than his heart attacks, which featured pain and he still has some chronic anginal pain here and there. He first laid down to see if the sensation would pass, which it didn't, so he called his wife after approximately 10-15 minutes and she came home. She states that he sounded very short winded over the phone. She describes seeing him as looking cold and clammy with blue lips and working hard to breathe. she called EMS out of concern for a heart attack.   The patient states that when EMS arrived they also noted wheezing and so administered oxygen and albuterol and told him that his oxygen saturation was very low in the 80s. He says that as soon as these were administered he started to feel better. On arrival to the ED his wife noted that he was quite flushed, looked red from head to toe and that this passed after several minutes and left a couple of "blotchy" patches on his  chest. Those also resolved.  He was watched at [facility] over the weekend and was transferred here today because of mildly elevated troponin and the development some lateral T-wave changes.  He has had some twinges of chest pain since he was admitted.  Dr. Claiborne Billings had recently placed him on some ranexa on a most recent visit.  He continues to smoke about 10 cigarettes a day.  He denies PND, orthopnea or edema.    Past Medical History  Diagnosis Date  . CAD (coronary artery disease)   . History of MI (myocardial infarction) 1993    PTCA at [facility] with stenting  . Hypertension   . Hyperlipidemia   . GERD (gastroesophageal reflux disease)   . Broken neck 2008    fall from ladder  . History of echocardiogram 10/29/2009    EF 18-84%; LV systolic function normal; mildly sclerotic AV  . History of nuclear stress test 08/21/2008    dipyridamole; negative for ischemia; normal study   . History of Doppler ultrasound 04/13/2006    LEAs; bilat ABIs - no evidence of arterial insuff.; bilat PVRs - normal; irregular non-hemodynamically significant plaque bilat in SFA     Past Surgical History  Procedure Laterality Date  . Coronary artery bypass graft  2009    revascularization by Dr. Koleen Nimrod; LIMA to LAD, free RIMA graft to OM1, sequential vein gradt to PDA & PL segment  . Cardiac catheterization  10/24/2009    r/t CP; significant native CAD,  patent LIMA graft to LAD & patent RIMA to obtuse marginal vessel; graft to RCA 99% stenosed at anastomses; stenting to RCA reduced to 20-30%  . Lumbar spine surgery  2004    s/p fusion L4-S1; 4 back surgeries  . Parathyroidectomy  2001  . Knee surgery      Left Knee (1992), Right Knee (1998);   Marland Kitchen Carpal tunnel release  2000  . Neck fusion  1999  . Skin cancer excision  1999    face  . Left heart catheterization with coronary/graft angiogram N/A 11/29/2012    Procedure: LEFT HEART CATHETERIZATION WITH Beatrix Fetters;  Surgeon: Troy Sine, MD;   Location: [facility] CATH LAB;  Service: Cardiovascular;  Laterality: N/A;   Allergies: has No Known Allergies.   Medications: Prior to Admission medications   Medication Sig Start Date End Date Taking? Authorizing Provider  amLODipine (NORVASC) 5 MG tablet Take 1 tablet (5 mg total) by mouth daily. 04/02/14  Yes Troy Sine, MD  aspirin EC 81 MG tablet Take 81 mg by mouth daily.   Yes Historical Provider, MD  atorvastatin (LIPITOR) 40 MG tablet Take 40 mg by mouth daily.   Yes Historical Provider, MD  clopidogrel (PLAVIX) 75 MG tablet Take 1 tablet (75 mg total) by mouth daily. 01/09/14  Yes Troy Sine, MD  docusate sodium (COLACE) 100 MG capsule Take 100 mg by mouth daily.   Yes Historical Provider, MD  isosorbide mononitrate (IMDUR) 120 MG 24 hr tablet Take 1 tablet (120 mg total) by mouth daily. 01/09/14  Yes Troy Sine, MD  lisinopril (PRINIVIL,ZESTRIL) 20 MG tablet Take 20 mg by mouth daily.   Yes Historical Provider, MD  loratadine (CLARITIN) 10 MG tablet Take 10 mg by mouth daily as needed for allergies.   Yes Historical Provider, MD  metoprolol tartrate (LOPRESSOR) 25 MG tablet Take 1 tablet (25 mg total) by mouth 2 (two) times daily. 12/12/13  Yes Troy Sine, MD  pantoprazole (PROTONIX) 40 MG tablet Take 40 mg by mouth daily.    Yes Historical Provider, MD  ranolazine (RANEXA) 1000 MG SR tablet Take 1 tablet (1,000 mg total) by mouth 2 (two) times daily. 03/04/14  Yes Troy Sine, MD  nitroGLYCERIN (NITROSTAT) 0.4 MG SL tablet Place 0.4 mg under the tongue every 5 (five) minutes as needed for chest pain.    Historical Provider, MD   Family History:  Family Status  Relation Status Death Age  . Mother Deceased 35    MI, hi chol.  . Father Deceased 39    CVA  . Sister      hi. chol.     Social History:   reports that he has been smoking Cigarettes.  He has been smoking about 0.00 packs per day for the past 30 years. He has never used smokeless tobacco. He reports that  he does not drink alcohol or use illicit drugs.   History   Social History Narrative     Review of Systems:  He has chronic low back pain. Other than as noted above, the remainder of the review of systems is normal  Physical Exam: BP 144/68 mmHg  Pulse 60  Temp(Src) 98 F (36.7 C) (Oral)  Resp 16  Ht 5\' 2"  (1.575 m)  Wt 78.654 kg (173 lb 6.4 oz)  BMI 31.71 kg/m2  SpO2 100%   General appearance: Pleasant obese male in no acute distress Head: Normocephalic, without obvious abnormality, atraumatic Eyes: conjunctivae/corneas clear. PERRL,  EOM's intact. Fundi Neck: no adenopathy, no carotid bruit, no JVD and supple, symmetrical, trachea midline Lungs: clear to auscultation bilaterally, healed median sternotomy scar Heart: regular rate and rhythm, S1, S2 normal, no murmur, click, rub or gallop Abdomen: soft, non-tender; bowel sounds normal; no masses,  no organomegaly Rectal: deferred Extremities: extremities normal, atraumatic, no cyanosis or edema, healed scar from saphenous vein grafting Pulses: 2+ and symmetric Skin: Skin color, texture, turgor normal. No rashes or lesions Neurologic: Grossly normal  Labs: Labs reviewed from [facility] showed sodium 139, potassium 4.0, CL 106, CO2 24, BUN 17, creatinine 1.2, glucose 108 this morning.  Troponin was 0.36 and peaked at 0.12 that was very minimally elevated.  BNP level was 584. TSH was evidently elevated at 6. EKG: LVH with some lateral T-wave changes, sinus rhythm  Radiology: Chest x-ray at [facility] showed no acute cardiopulmonary disease.  A CT angiogram showed no evidence of pulmonary embolism.  There is very subtle patchy posterior mid lower lungs.  It may be due to atelectasis versus infectious or inflammatory process.  There were noted acute findings on the CT abdomen and no evidence of mesenteric ischemia, but he did have significant atherosclerotic vascular disease there.   IMPRESSIONS: 1.  Episode of  acute respiratory distress associated with minimal elevation of high sensitivity troponin and fluctuating EKG changes. 2.  Coronary artery disease with previous bypass grafting and previous percutaneous intervention of the vein graft. 3.  Ongoing cigarette abuse. 4.  Obesity. 5.  Hypertension. 6.  Hyperlipidemia. 7.  New diagnosis of hypothyroidism  PLAN: Continue intravenous heparin.  Keep nothing by mouth for possible repeat catheterization tomorrow.  Signed: Kerry Hough MD [facility] Cardiology  04/28/2014, 9:50 PM

## 2014-04-28 NOTE — Progress Notes (Signed)
Dr. Thurman Coyer Office called regarding new admission from [facility]. thanks

## 2014-04-28 NOTE — Progress Notes (Signed)
ANTICOAGULATION CONSULT NOTE - Initial Consult  Pharmacy Consult for Heparin  Indication: chest pain/ACS  No Known Allergies  Patient Measurements: Height: 5\' 2"  (157.5 cm) Weight: 173 lb 6.4 oz (78.654 kg) IBW/kg (Calculated) : 54.6  Vital Signs: Temp: 98 F (36.7 C) (12/13 2100) Temp Source: Oral (12/13 1842) BP: 144/68 mmHg (12/13 2100) Pulse Rate: 60 (12/13 2100)  Labs: Tx from OSH  Medical History: Past Medical History  Diagnosis Date  . CAD (coronary artery disease)   . History of MI (myocardial infarction) 1993    PTCA at [facility] with stenting  . Hypertension   . Hyperlipidemia   . GERD (gastroesophageal reflux disease)   . Broken neck 2008    fall from ladder  . History of echocardiogram 10/29/2009    EF 46-80%; LV systolic function normal; mildly sclerotic AV  . History of nuclear stress test 08/21/2008    dipyridamole; negative for ischemia; normal study   . History of Doppler ultrasound 04/13/2006    LEAs; bilat ABIs - no evidence of arterial insuff.; bilat PVRs - normal; irregular non-hemodynamically significant plaque bilat in SFA   Assessment: 63 y/o M from [facility] with abnormal troponin. Heparin is currently running at 1140 units/hr (11.4 mL/hr) per RN.   Goal of Therapy:  Heparin level 0.3-0.7 units/ml Monitor platelets by anticoagulation protocol: Yes   Plan:  -Heparin infusion at 1150 units/hr -Check HL with AM labs -Daily CBC/HL -Monitor for bleeding -Cath tomorrow   Narda Bonds 04/28/2014,10:13 PM

## 2014-04-29 ENCOUNTER — Inpatient Hospital Stay (HOSPITAL_COMMUNITY): Payer: Medicare Other

## 2014-04-29 DIAGNOSIS — I252 Old myocardial infarction: Secondary | ICD-10-CM

## 2014-04-29 DIAGNOSIS — I25110 Atherosclerotic heart disease of native coronary artery with unstable angina pectoris: Secondary | ICD-10-CM

## 2014-04-29 DIAGNOSIS — E785 Hyperlipidemia, unspecified: Secondary | ICD-10-CM

## 2014-04-29 DIAGNOSIS — I214 Non-ST elevation (NSTEMI) myocardial infarction: Principal | ICD-10-CM

## 2014-04-29 DIAGNOSIS — I10 Essential (primary) hypertension: Secondary | ICD-10-CM

## 2014-04-29 LAB — CBC
HCT: 39.1 % (ref 39.0–52.0)
Hemoglobin: 13.2 g/dL (ref 13.0–17.0)
MCH: 30.5 pg (ref 26.0–34.0)
MCHC: 33.8 g/dL (ref 30.0–36.0)
MCV: 90.3 fL (ref 78.0–100.0)
PLATELETS: 130 10*3/uL — AB (ref 150–400)
RBC: 4.33 MIL/uL (ref 4.22–5.81)
RDW: 13 % (ref 11.5–15.5)
WBC: 6.1 10*3/uL (ref 4.0–10.5)

## 2014-04-29 LAB — HEPATIC FUNCTION PANEL
ALT: 15 U/L (ref 0–53)
AST: 14 U/L (ref 0–37)
Albumin: 3.4 g/dL — ABNORMAL LOW (ref 3.5–5.2)
Alkaline Phosphatase: 86 U/L (ref 39–117)
Bilirubin, Direct: <0.2 mg/dL (ref 0.0–0.3)
Total Bilirubin: 0.5 mg/dL (ref 0.3–1.2)
Total Protein: 6.5 g/dL (ref 6.0–8.3)

## 2014-04-29 LAB — LIPID PANEL
Cholesterol: 135 mg/dL (ref 0–200)
HDL: 40 mg/dL (ref >39)
LDL CALC: 69 mg/dL (ref 0–99)
TRIGLYCERIDES: 128 mg/dL (ref <150)
Total CHOL/HDL Ratio: 3.4 RATIO
VLDL: 26 mg/dL (ref 0–40)

## 2014-04-29 LAB — BASIC METABOLIC PANEL
Anion gap: 12 (ref 5–15)
BUN: 13 mg/dL (ref 6–23)
CO2: 24 meq/L (ref 19–32)
Calcium: 9.7 mg/dL (ref 8.4–10.5)
Chloride: 105 mEq/L (ref 96–112)
Creatinine, Ser: 0.88 mg/dL (ref 0.50–1.35)
GFR calc Af Amer: >90 mL/min (ref >90)
GFR calc non Af Amer: 89 mL/min — ABNORMAL LOW (ref >90)
GLUCOSE: 105 mg/dL — AB (ref 70–99)
Potassium: 4.6 mEq/L (ref 3.7–5.3)
SODIUM: 141 meq/L (ref 137–147)

## 2014-04-29 LAB — PROTIME-INR
INR: 1.04 (ref 0.00–1.49)
Prothrombin Time: 13.7 seconds (ref 11.6–15.2)

## 2014-04-29 LAB — HEPARIN LEVEL (UNFRACTIONATED)
HEPARIN UNFRACTIONATED: 0.49 [IU]/mL (ref 0.30–0.70)
Heparin Unfractionated: 0.44 IU/mL (ref 0.30–0.70)

## 2014-04-29 LAB — TSH: TSH: 1.56 u[IU]/mL (ref 0.350–4.500)

## 2014-04-29 LAB — TROPONIN I: Troponin I: <0.3 ng/mL (ref <0.30)

## 2014-04-29 MED ORDER — SODIUM CHLORIDE 0.9 % IJ SOLN: 3.0000 mL | INTRAMUSCULAR | Status: DC | PRN

## 2014-04-29 MED ORDER — SODIUM CHLORIDE 0.9 % IJ SOLN: 3.0000 mL | Freq: Two times a day (BID) | INTRAMUSCULAR | Status: DC

## 2014-04-29 MED ORDER — ASPIRIN 81 MG PO CHEW
324.0000 mg | CHEWABLE_TABLET | ORAL | Status: AC
  Administered 2014-04-30: 324 mg via ORAL
  Filled 2014-04-29: qty 4

## 2014-04-29 MED ORDER — SODIUM CHLORIDE 0.9 % IV SOLN: 250.0000 mL | INTRAVENOUS | Status: DC | PRN

## 2014-04-29 MED ORDER — ASPIRIN EC 81 MG PO TBEC: 81.0000 mg | DELAYED_RELEASE_TABLET | Freq: Every day | ORAL | Status: DC

## 2014-04-29 MED ORDER — SODIUM CHLORIDE 0.9 % IV SOLN
1.0000 mL/kg/h | INTRAVENOUS | Status: DC
  Administered 2014-04-30: 1 mL/kg/h via INTRAVENOUS

## 2014-04-29 NOTE — Progress Notes (Signed)
ANTICOAGULATION CONSULT NOTE - Follow Up Consult  Pharmacy Consult for Heparin Indication: chest pain/ACS  No Known Allergies  Patient Measurements: Height: 5\' 2"  (157.5 cm) Weight: 164 lb 1.6 oz (74.435 kg) IBW/kg (Calculated) : 54.6   Vital Signs: Temp: 98.1 F (36.7 C) (12/14 0500) BP: 132/62 mmHg (12/14 0500) Pulse Rate: 57 (12/14 0500)  Labs:  Recent Labs  04/29/14 0419 04/29/14 0420 04/29/14 0713  HGB  --   --  13.2  HCT  --   --  39.1  PLT  --   --  130*  LABPROT  --  13.7  --   INR  --  1.04  --   HEPARINUNFRC 0.49  --   --   CREATININE 0.88  --   --   TROPONINI <0.30  --   --     Estimated Creatinine Clearance: 75 mL/min (by C-G formula based on Cr of 0.88).   Assessment: 63 y.o. male with chest pain/SOB continues on heparin for NSTEMI. Heparin level at goal, no bleeding issues noted. Plan is for cath although schedule is full today so will be on board for tomorrow morning.   Goal of Therapy:  Heparin level 0.3-0.7 units/ml Monitor platelets by anticoagulation protocol: Yes   Plan:  Continue Heparin at current rate  Follow up for cath tomorrow  Erin Hearing PharmD., BCPS Clinical Pharmacist Pager [phone] 04/29/2014 2:17 PM

## 2014-04-29 NOTE — Progress Notes (Signed)
UR Completed Shareta Fishbaugh Graves-Bigelow, RN,BSN [phone]  

## 2014-04-29 NOTE — Progress Notes (Signed)
ANTICOAGULATION CONSULT NOTE - Follow Up Consult  Pharmacy Consult for Heparin Indication: chest pain/ACS  No Known Allergies  Patient Measurements: Height: 5\' 2"  (157.5 cm) Weight: 173 lb 6.4 oz (78.654 kg) IBW/kg (Calculated) : 54.6   Vital Signs: Temp: 98 F (36.7 C) (12/13 2100) Temp Source: Oral (12/13 1842) BP: 144/68 mmHg (12/13 2100) Pulse Rate: 60 (12/13 2100)  Labs:  Recent Labs  04/29/14 0419  HEPARINUNFRC 0.49    Estimated Creatinine Clearance: 71.3 mL/min (by C-G formula based on Cr of 0.95).   Assessment: 63 y.o. male with chest pain/SOB for heparin  Goal of Therapy:  Heparin level 0.3-0.7 units/ml Monitor platelets by anticoagulation protocol: Yes   Plan:  Continue Heparin at current rate  F/U plan for cath  Caryl Pina 04/29/2014,4:57 AM

## 2014-04-29 NOTE — Progress Notes (Signed)
Patient Name: Robert Melton Date of Encounter: 04/29/2014  Principal Problem:   Non-STEMI (non-ST elevated myocardial infarction) Active Problems:   Atherosclerotic heart disease of native coronary artery with unstable angina pectoris   Old inferior wall myocardial infarction   Hyperlipidemia with target LDL less than 70   Essential hypertension   Tobacco use   Obesity (BMI 30.0-34.9)   Primary Cardiologist: Dr Claiborne Billings  Patient Profile: 63 yo male w/ hx CAD/CABG/MI, tx 12/13 from [facility] for SOB, wheezing, chest pain, mild trop elevation.  SUBJECTIVE: Dr. Claiborne Billings had changed meds last OV, but told pt cath was next step. He has never had wheezing like that before. Admits tobacco is bad for him.  OBJECTIVE Filed Vitals:   04/29/14 0500 04/29/14 1134 04/29/14 1137 04/29/14 1138  BP: 132/62 138/65 138/65   Pulse: 57   62  Temp: 98.1 F (36.7 C)     TempSrc:      Resp: 18     Height:      Weight: 164 lb 1.6 oz (74.435 kg)     SpO2: 100%      No intake or output data in the 24 hours ending 04/29/14 1212 Filed Weights   04/28/14 1842 04/29/14 0500  Weight: 173 lb 6.4 oz (78.654 kg) 164 lb 1.6 oz (74.435 kg)    PHYSICAL EXAM General: Well developed, well nourished, male in no acute distress. Head: Normocephalic, atraumatic.  Neck: Supple without bruits, JVD. Lungs:  Resp regular and unlabored, CTA. Heart: RRR, S1, S2, no S3, S4, or murmur; no rub. Abdomen: Soft, non-tender, non-distended, BS + x 4.  Extremities: No clubbing, cyanosis, edema.  Neuro: Alert and oriented X 3. Moves all extremities spontaneously. Psych: Normal affect.  LABS: CBC:  Recent Labs  04/29/14 0713  WBC 6.1  HGB 13.2  HCT 39.1  MCV 90.3  PLT 130*   INR:  Recent Labs  04/29/14 0420  INR 5.73   Basic Metabolic Panel:  Recent Labs  04/29/14 0419  NA 141  K 4.6  CL 105  CO2 24  GLUCOSE 105*  BUN 13  CREATININE 0.88  CALCIUM 9.7   Liver Function Tests:  Recent  Labs  04/29/14 0420  AST 14  ALT 15  ALKPHOS 86  BILITOT 0.5  PROT 6.5  ALBUMIN 3.4*   Cardiac Enzymes:  Recent Labs  04/29/14 0419  TROPONINI <0.30   Fasting Lipid Panel:  Recent Labs  04/29/14 0713  CHOL 135  HDL 40  LDLCALC 69  TRIG 128  CHOLHDL 3.4   Thyroid Function Tests:  Recent Labs  04/29/14 0419  TSH 1.560   TELE:   SR, Sinus brady, high 40s     Radiology/Studies: CXR - NAD CTA chest - ?infiltrate  Current Medications:  . amLODipine  5 mg Oral Daily  . aspirin EC  81 mg Oral Daily  . atorvastatin  40 mg Oral Daily  . clopidogrel  75 mg Oral Daily  . docusate sodium  100 mg Oral Daily  . isosorbide mononitrate  120 mg Oral Daily  . lisinopril  20 mg Oral Daily  . metoprolol tartrate  25 mg Oral BID  . pantoprazole  40 mg Oral Daily  . ranolazine  1,000 mg Oral BID   . heparin      ASSESSMENT AND PLAN: Active Problems:   Non-STEMI (non-ST elevated myocardial infarction) - mild elevation in troponin at [facility] in the setting of sig resp distress.  Wheezing - cause unclear, hx tobacco use, but no history of wheezing, not on inhalers/nebs. Likely has a component of COPD, MD advise on adding inhaler.    Lipids - no check since July, do liver/lipids as add-on.  Plan - pt feels cath is next step, discuss with MD.  PA time with patient 15 minutes Signed, Rosaria Ferries, PA-C 12:12 PM 04/29/2014   ATTENDING ATTESTATION:  I have seen and evaluated the patient this morning along with Rosaria Ferries, PA. I reviewed all the available data in chart  I agree with her findings, examination as well as impression recommendations.  The patient is a long-standing patient of Dr. Shelva Majestic who has been titrating his antianginal regimen as an outpatient. He was admitted for acute onset dyspnea with probable acute diastolic heart failure related dyspnea on Friday night to [facility]. He had a elevated troponin level at that time. He was  transferred late last night to [facility] and seen by Dr. Wynonia Lawman who agreed with Dr. Lucy Chris last note that the next step if there is a recurrence of symptoms would be just simply to proceed with cardiac catheterization. He remains stable with no further episodes of either anginal chest pain or significant dyspnea. He is on IV heparin and breathing comfortably. No PND, orthopnea or edema. He does have a history of tobacco use and could potentially have a COPD component however his lungs do not sound bad today. There was a question of possible infiltrate on his initial CT scan done at [facility] which does not seem apparent on exam and he does not have an elevated white blood cell count or other symptoms of pneumonia.  He is on beta blocker, ACE inhibitor, statin as well as Ranexa. He is also on amlodipine meeting full requirements for high dose medical therapy of angina.  I think the best option is to plan cardiac catheterization. Unfortunately there are no slots available on the car to Schedule for today. We'll post him for tomorrow morning.  I reviewed the risks benefits alternatives and indications the patient.  All questions were answered.    Risks / Complications include, but not limited to: Death, MI, CVA/TIA, VF/VT (with defibrillation), Bradycardia (need for temporary pacer placement), contrast induced nephropathy, bleeding / bruising / hematoma / pseudoaneurysm, vascular or coronary injury (with possible emergent CT or Vascular Surgery), adverse medication reactions, infection.  Additional risks involving the use of radiation with the possibility of radiation burns and cancer were explained in detail.  The patient  and family) voice understanding and agree to proceed.     MD Time with pt: 15 min  HARDING,DAVID W, M.D., M.S. Interventional Cardiologist   Pager # [phone]

## 2014-04-29 NOTE — Care Management Note (Unsigned)
    Page 1 of 1   04/29/2014     2:24:09 PM CARE MANAGEMENT NOTE 04/29/2014  Patient:  Robert Melton, Robert Melton   Account Number:  [id number]  Date Initiated:  04/29/2014  Documentation initiated by:  GRAVES-BIGELOW,Bich Mchaney  Subjective/Objective Assessment:   Pt admitted as a transfer pt from [facility] for cp and increased troponins. Plan for cath 04-30-14.     Action/Plan:   CM will continue to monitor for disposition needs.   Anticipated DC Date:  05/01/2014   Anticipated DC Plan:  [place]  CM consult      Choice offered to / List presented to:             Status of service:  In process, will continue to follow Medicare Important Message given?   (If response is "NO", the following Medicare IM given date fields will be blank) Date Medicare IM given:   Medicare IM given by:   Date Additional Medicare IM given:   Additional Medicare IM given by:    Discharge Disposition:    Per UR Regulation:  Reviewed for med. necessity/level of care/duration of stay  If discussed at [place] of Stay Meetings, dates discussed:    Comments:

## 2014-04-30 ENCOUNTER — Encounter (HOSPITAL_COMMUNITY)
Admission: AD | Disposition: A | Discharge status: Home / Self Care | Payer: Self-pay | Source: Other Acute Inpatient Hospital | Attending: Cardiovascular Disease

## 2014-04-30 ENCOUNTER — Encounter (HOSPITAL_COMMUNITY): Payer: Self-pay | Admitting: General Practice

## 2014-04-30 DIAGNOSIS — I2510 Atherosclerotic heart disease of native coronary artery without angina pectoris: Secondary | ICD-10-CM

## 2014-04-30 HISTORY — PX: CARDIAC CATHETERIZATION: SHX172

## 2014-04-30 HISTORY — PX: LEFT HEART CATHETERIZATION WITH CORONARY/GRAFT ANGIOGRAM: SHX5450

## 2014-04-30 LAB — CBC
HEMATOCRIT: 39.3 % (ref 39.0–52.0)
Hemoglobin: 13.2 g/dL (ref 13.0–17.0)
MCH: 30.3 pg (ref 26.0–34.0)
MCHC: 33.6 g/dL (ref 30.0–36.0)
MCV: 90.1 fL (ref 78.0–100.0)
Platelets: 136 10*3/uL — ABNORMAL LOW (ref 150–400)
RBC: 4.36 MIL/uL (ref 4.22–5.81)
RDW: 13.1 % (ref 11.5–15.5)
WBC: 6.7 10*3/uL (ref 4.0–10.5)

## 2014-04-30 LAB — HEPARIN LEVEL (UNFRACTIONATED): Heparin Unfractionated: 0.55 IU/mL (ref 0.30–0.70)

## 2014-04-30 SURGERY — LEFT HEART CATHETERIZATION WITH CORONARY/GRAFT ANGIOGRAM
Anesthesia: LOCAL

## 2014-04-30 MED ORDER — HEPARIN (PORCINE) IN NACL 2-0.9 UNIT/ML-% IJ SOLN
INTRAMUSCULAR | Status: AC
  Filled 2014-04-30: qty 1000

## 2014-04-30 MED ORDER — ACETAMINOPHEN 325 MG PO TABS: 650.0000 mg | ORAL_TABLET | ORAL | Status: DC | PRN

## 2014-04-30 MED ORDER — ONDANSETRON HCL 4 MG/2ML IJ SOLN: 4.0000 mg | Freq: Four times a day (QID) | INTRAMUSCULAR | Status: DC | PRN

## 2014-04-30 MED ORDER — VERAPAMIL HCL 2.5 MG/ML IV SOLN
INTRAVENOUS | Status: AC
  Filled 2014-04-30: qty 2

## 2014-04-30 MED ORDER — NITROGLYCERIN 1 MG/10 ML FOR IR/CATH LAB
INTRA_ARTERIAL | Status: AC
  Filled 2014-04-30: qty 10

## 2014-04-30 MED ORDER — OXYCODONE-ACETAMINOPHEN 5-325 MG PO TABS: 1.0000 | ORAL_TABLET | ORAL | Status: DC | PRN

## 2014-04-30 MED ORDER — LIDOCAINE HCL (PF) 1 % IJ SOLN
INTRAMUSCULAR | Status: AC
  Filled 2014-04-30: qty 30

## 2014-04-30 MED ORDER — SODIUM CHLORIDE 0.9 % IV SOLN: INTRAVENOUS | Status: AC

## 2014-04-30 MED ORDER — HEPARIN SODIUM (PORCINE) 1000 UNIT/ML IJ SOLN
INTRAMUSCULAR | Status: AC
  Filled 2014-04-30: qty 1

## 2014-04-30 MED ORDER — FENTANYL CITRATE 0.05 MG/ML IJ SOLN
INTRAMUSCULAR | Status: AC
  Filled 2014-04-30: qty 2

## 2014-04-30 MED ORDER — MIDAZOLAM HCL 2 MG/2ML IJ SOLN
INTRAMUSCULAR | Status: AC
  Filled 2014-04-30: qty 2

## 2014-04-30 NOTE — CV Procedure (Signed)
     Left Heart Catheterization with Coronary and Bypass Angiography Report  Robert Melton  63 y.o.  male 11-Oct-1950  Procedure Date: 04/30/2014 Referring Physician: Glenetta Hew, M.D. Primary Cardiologist: [place] Bali, M.D.  INDICATIONS: Sudden dyspnea with minimal elevation in troponins attributed to probable acute diastolic heart failure.   PROCEDURE: 1. Left heart catheterization; 2. Coronary angiography; 3. Left ventriculography; 4. Bypass graft angiography; 5. Internal mammary graft angiography  CONSENT:  The risks, benefits, and details of the procedure were explained in detail to the patient. Risks including death, stroke, heart attack, kidney injury, allergy, limb ischemia, bleeding and radiation injury were discussed.  The patient verbalized understanding and wanted to proceed.  Informed written consent was obtained.  PROCEDURE TECHNIQUE:  After Xylocaine anesthesia a 5 French Slender sheath was placed in the left radial artery with an angiocath and the modified Seldinger technique.  Coronary angiography was done using a 5 F JR4, JL 3.5 cm, Amplatz Left 1, and A2 MP diagnostic catheters.  Left ventriculography was done using the A2 MP catheter and hand injection.   The images were compared with angiography performed one-year ago. No significant differences noted.   CONTRAST:  Total of 110 cc.  COMPLICATIONS:  None   HEMODYNAMICS:  Aortic pressure 115/59 mmHg; LV pressure 116/5 mmHg; LVEDP 14 mmHg  ANGIOGRAPHIC DATA:   The left main coronary artery is diffusely diseased and heavily calcified..  The left anterior descending artery is totally occluded at the left main.  The left circumflex artery is diffusely diseased with high-grade obstruction prior to bifurcation into 2 obtuse marginal branches. The obtuse marginal #1 is supplied by the free radial.  The right coronary artery is severely and diffusely diseased with 95% stenosis distally. Competitive flow is noted in  the PDA. LV branch is also supplied. This appearance is unchanged from the prior study in 2014.Marland Kitchen  BYPASS GRAFT ANGIOGRAPHY:  LIMA to LAD is widely patent. Free radial to ramus intermedius is widely patent. SVG to PDA is widely patent.  LEFT VENTRICULOGRAM:  Left ventricular angiogram was done in the 30 RAO projection and revealed mild inferior hypokinesis. EF is 45-50%.   IMPRESSIONS:  1. Severe native coronary disease with total occlusion of the LAD, high-grade obstruction in the proximal and mid circumflex, and high-grade diffuse calcified disease proximal to distal RCA. These findings are unchanged compared to 2014 angiogram. 2. Widely patent LIMA to LAD, free RIMA to obtuse marginal, and SVG to PDA. Appearance is unchanged from prior angiogram one year ago. 3. Low normal to mildly depressed LVEF with normal filling pressures.   RECOMMENDATION:  Explanation for sudden dyspnea and elevated troponins is not apparent. Has pulmonary embolism being considered?Marland Kitchen

## 2014-04-30 NOTE — Discharge Summary (Signed)
CARDIOLOGY DISCHARGE SUMMARY   Patient ID: Robert Melton MRN: [id number] DOB/AGE: 64-Jan-1952 64 y.o.  Admit date: 04/28/2014 Discharge date: 04/30/2014  PCP: Coy Saunas, MD Primary Cardiologist: Dr. Claiborne Billings  Primary Discharge Diagnosis:  Acute non-ST segment elevation myocardial infarction Secondary Discharge Diagnosis:    Atherosclerotic heart disease of native coronary artery with unstable angina pectoris   Old inferior wall myocardial infarction   Hyperlipidemia with target LDL less than 70   Essential hypertension   Tobacco use   Obesity (BMI 30.0-34.9)   Non-STEMI (non-ST elevated myocardial infarction)   Pulmonary nodules, small - follow-up CT in one year recommended  PROCEDURE: 1. Left heart catheterization; 2. Coronary angiography; 3. Left ventriculography; 4. Bypass graft angiography; 5. Internal mammary graft angiography  Hospital Course: Robert Melton is a 63 y.o. male with a history of CAD/CABG/MI, who had sudden onset of severe shortness of breath on the day of admission. He initially went to [facility] where he was treated medically for SOB, wheezing, chest pain, and was noted to have a mild trop elevation. There was concern for a non-STEMI and he was transferred to [facility] for further evaluation and treatment.  His subsequent enzymes were negative. He was treated effectively with nebulizers and his respiratory status improved. A CT angiogram of the chest was performed at [facility] which showed no PE, although there was concern for an infiltrate and some small pulmonary nodules, follow-up CT in one year recommended. A chest x-ray was repeated once he arrived at [facility], and did not show any infiltrate. He had no cough or fever and his white count was within normal limits.  Once his respiratory status improved, his chest pain resolved. However, with known significant CAD, a cardiac catheterization was recommended to further define his anatomy. This was  performed on 12/15.  The cardiac catheterization results are below. His anatomy was basically unchanged and his filling pressures were normal so he was not felt to have any significant heart failure. Medical therapy for CAD emphasis on cardiac risk factor reduction was recommended.  After the cath, Robert Melton was ambulating without chest pain or shortness of breath. His cath site was without ecchymosis or hematoma. No further inpatient workup was indicated and he is considered stable for discharge, to follow up as an outpatient.  Labs:   Lab Results  Component Value Date   WBC 6.7 04/30/2014   HGB 13.2 04/30/2014   HCT 39.3 04/30/2014   MCV 90.1 04/30/2014   PLT 136* 04/30/2014     Recent Labs Lab 04/29/14 0419 04/29/14 0420  NA 141  --   K 4.6  --   CL 105  --   CO2 24  --   BUN 13  --   CREATININE 0.88  --   CALCIUM 9.7  --   PROT  --  6.5  BILITOT  --  0.5  ALKPHOS  --  86  ALT  --  15  AST  --  14  GLUCOSE 105*  --     Recent Labs  04/29/14 0419  TROPONINI <0.30   Lipid Panel     Component Value Date/Time   CHOL 135 04/29/2014 0713   TRIG 128 04/29/2014 0713   HDL 40 04/29/2014 0713   CHOLHDL 3.4 04/29/2014 0713   VLDL 26 04/29/2014 0713   LDLCALC 69 04/29/2014 0713    Recent Labs  04/29/14 0420  INR 1.04      Radiology: Dg Chest  2 View 04/29/2014   CLINICAL DATA:  Chest pain with myocardial infarction  EXAM: CHEST  2 VIEW  COMPARISON:  November 27, 2012  FINDINGS: There are apparent nipple shadows bilaterally. There is no appreciable edema or consolidation. There is underlying emphysema. There is mild scarring in the left mid lung. The heart size is normal. Pulmonary vascularity reflects underlying emphysema. Patient is status post coronary artery bypass grafting. No adenopathy. There is degenerative change in the thoracic spine.  IMPRESSION: Underlying emphysematous change. No edema or consolidation. There is mild scarring in the left mid lung. Probable  nipple shadows bilaterally ; repeat film with nipple markers advised to confirm this observation.   Electronically Signed   By: Lowella Grip M.D.   On: 04/29/2014 10:56   Cardiac Cath: 04/30/2014 ANGIOGRAPHIC DATA: The left main coronary artery is diffusely diseased and heavily calcified.. The left anterior descending artery is totally occluded at the left main. The left circumflex artery is diffusely diseased with high-grade obstruction prior to bifurcation into 2 obtuse marginal branches. The obtuse marginal #1 is supplied by the free radial. The right coronary artery is severely and diffusely diseased with 95% stenosis distally. Competitive flow is noted in the PDA. LV branch is also supplied. This appearance is unchanged from the prior study in 2014.Marland Kitchen BYPASS GRAFT ANGIOGRAPHY:  LIMA to LAD is widely patent. Free radial to ramus intermedius is widely patent. SVG to PDA is widely patent. LEFT VENTRICULOGRAM: Left ventricular angiogram was done in the 30 RAO projection and revealed mild inferior hypokinesis. EF is 45-50%. IMPRESSIONS: 1. Severe native coronary disease with total occlusion of the LAD, high-grade obstruction in the proximal and mid circumflex, and high-grade diffuse calcified disease proximal to distal RCA. These findings are unchanged compared to 2014 angiogram. 2. Widely patent LIMA to LAD, free RIMA to obtuse marginal, and SVG to PDA. Appearance is unchanged from prior angiogram one year ago. 3. Low normal to mildly depressed LVEF with normal filling pressures.  EKG: 04/30/2014 Sinus rhythm, nonspecific T wave abnormality   FOLLOW UP PLANS AND APPOINTMENTS No Known Allergies   Medication List    TAKE these medications        amLODipine 5 MG tablet  Commonly known as:  NORVASC  Take 1 tablet (5 mg total) by mouth daily.     aspirin EC 81 MG tablet  Take 81 mg by mouth daily.     atorvastatin 40 MG tablet  Commonly known as:  LIPITOR  Take 40 mg by  mouth daily.     clopidogrel 75 MG tablet  Commonly known as:  PLAVIX  Take 1 tablet (75 mg total) by mouth daily.     docusate sodium 100 MG capsule  Commonly known as:  COLACE  Take 100 mg by mouth daily.     isosorbide mononitrate 120 MG 24 hr tablet  Commonly known as:  IMDUR  Take 1 tablet (120 mg total) by mouth daily.     lisinopril 20 MG tablet  Commonly known as:  PRINIVIL,ZESTRIL  Take 20 mg by mouth daily.     loratadine 10 MG tablet  Commonly known as:  CLARITIN  Take 10 mg by mouth daily as needed for allergies.     metoprolol tartrate 25 MG tablet  Commonly known as:  LOPRESSOR  Take 1 tablet (25 mg total) by mouth 2 (two) times daily.     nitroGLYCERIN 0.4 MG SL tablet  Commonly known as:  NITROSTAT  Place 0.4 mg  under the tongue every 5 (five) minutes as needed for chest pain.     pantoprazole 40 MG tablet  Commonly known as:  PROTONIX  Take 40 mg by mouth daily.     ranolazine 1000 MG SR tablet  Commonly known as:  RANEXA  Take 1 tablet (1,000 mg total) by mouth 2 (two) times daily.        Discharge Instructions    Diet - low sodium heart healthy    Complete by:  As directed      Increase activity slowly    Complete by:  As directed           Follow-up Information    Follow up with Troy Sine, MD.   Specialty:  Cardiology   Why:  The office will call   Contact information:   [address] [place] [address] [phone]       Follow up with Coy Saunas, MD.   Specialty:  Family Medicine   Why:  To follow up on causes of wheezing and schedule a follow-up CT for pulmonary nodules in one year   Contact information:   [place] [address] [phone]       BRING ALL MEDICATIONS WITH YOU TO FOLLOW UP APPOINTMENTS  Time spent with patient to include physician time: 43 min Signed: Rosaria Ferries, PA-C 04/30/2014, 1:28 PM Co-Sign MD  I saw and examined the patient after his cardiac  catheterization. No evidence of new significant CAD noted on cath to explain the patient's symptoms. No evidence of PE on CTA PE protocol at [facility].  He has been asymptomatic during his hospital stay here. Was kept overnight for catheterization. He should be stable for discharge on current medications.  Agree with discharge summary  Jiyan Walkowski W, M.D., M.S. Interventional Cardiologist   Pager # [phone]

## 2014-04-30 NOTE — Interval H&P Note (Signed)
Cath Lab Visit (complete for each Cath Lab visit)  Clinical Evaluation Leading to the Procedure:   ACS: Yes.    Non-ACS:    Anginal Classification: CCS III  Anti-ischemic medical therapy: Maximal Therapy (2 or more classes of medications)  Non-Invasive Test Results: No non-invasive testing performed  Prior CABG: Previous CABG      History and Physical Interval Note:  04/30/2014 7:59 AM  Robert Melton  has presented today for surgery, with the diagnosis of cp  The various methods of treatment have been discussed with the patient and family. After consideration of risks, benefits and other options for treatment, the patient has consented to  Procedure(s): LEFT HEART CATHETERIZATION WITH CORONARY/GRAFT ANGIOGRAM (N/A) as a surgical intervention .  The patient's history has been reviewed, patient examined, no change in status, stable for surgery.  I have reviewed the patient's chart and labs.  Questions were answered to the patient's satisfaction.     Sinclair Grooms

## 2014-04-30 NOTE — Progress Notes (Signed)
Spoke with pt/wife in room. Cath was unchanged, med rx.  Need to consider PE, but CTA chest was performed at [facility] prior to admission and was negative for PE. There was concern for an infiltrate but he has been afebrile, white count within normal limits and no cough or shortness of breath. Okay for d/c later today.  Rosaria Ferries, PA-C 04/30/2014 10:43 AM Beeper [phone]

## 2014-04-30 NOTE — H&P (View-Only) (Signed)
Patient Name: Robert Melton Date of Encounter: 04/29/2014  Principal Problem:   Non-STEMI (non-ST elevated myocardial infarction) Active Problems:   Atherosclerotic heart disease of native coronary artery with unstable angina pectoris   Old inferior wall myocardial infarction   Hyperlipidemia with target LDL less than 70   Essential hypertension   Tobacco use   Obesity (BMI 30.0-34.9)   Primary Cardiologist: Dr Claiborne Billings  Patient Profile: 63 yo male w/ hx CAD/CABG/MI, tx 12/13 from [facility] for SOB, wheezing, chest pain, mild trop elevation.  SUBJECTIVE: Dr. Claiborne Billings had changed meds last OV, but told pt cath was next step. He has never had wheezing like that before. Admits tobacco is bad for him.  OBJECTIVE Filed Vitals:   04/29/14 0500 04/29/14 1134 04/29/14 1137 04/29/14 1138  BP: 132/62 138/65 138/65   Pulse: 57   62  Temp: 98.1 F (36.7 C)     TempSrc:      Resp: 18     Height:      Weight: 164 lb 1.6 oz (74.435 kg)     SpO2: 100%      No intake or output data in the 24 hours ending 04/29/14 1212 Filed Weights   04/28/14 1842 04/29/14 0500  Weight: 173 lb 6.4 oz (78.654 kg) 164 lb 1.6 oz (74.435 kg)    PHYSICAL EXAM General: Well developed, well nourished, male in no acute distress. Head: Normocephalic, atraumatic.  Neck: Supple without bruits, JVD. Lungs:  Resp regular and unlabored, CTA. Heart: RRR, S1, S2, no S3, S4, or murmur; no rub. Abdomen: Soft, non-tender, non-distended, BS + x 4.  Extremities: No clubbing, cyanosis, edema.  Neuro: Alert and oriented X 3. Moves all extremities spontaneously. Psych: Normal affect.  LABS: CBC:  Recent Labs  04/29/14 0713  WBC 6.1  HGB 13.2  HCT 39.1  MCV 90.3  PLT 130*   INR:  Recent Labs  04/29/14 0420  INR 1.27   Basic Metabolic Panel:  Recent Labs  04/29/14 0419  NA 141  K 4.6  CL 105  CO2 24  GLUCOSE 105*  BUN 13  CREATININE 0.88  CALCIUM 9.7   Liver Function Tests:  Recent  Labs  04/29/14 0420  AST 14  ALT 15  ALKPHOS 86  BILITOT 0.5  PROT 6.5  ALBUMIN 3.4*   Cardiac Enzymes:  Recent Labs  04/29/14 0419  TROPONINI <0.30   Fasting Lipid Panel:  Recent Labs  04/29/14 0713  CHOL 135  HDL 40  LDLCALC 69  TRIG 128  CHOLHDL 3.4   Thyroid Function Tests:  Recent Labs  04/29/14 0419  TSH 1.560   TELE:   SR, Sinus brady, high 40s     Radiology/Studies: CXR - NAD CTA chest - ?infiltrate  Current Medications:  . amLODipine  5 mg Oral Daily  . aspirin EC  81 mg Oral Daily  . atorvastatin  40 mg Oral Daily  . clopidogrel  75 mg Oral Daily  . docusate sodium  100 mg Oral Daily  . isosorbide mononitrate  120 mg Oral Daily  . lisinopril  20 mg Oral Daily  . metoprolol tartrate  25 mg Oral BID  . pantoprazole  40 mg Oral Daily  . ranolazine  1,000 mg Oral BID   . heparin      ASSESSMENT AND PLAN: Active Problems:   Non-STEMI (non-ST elevated myocardial infarction) - mild elevation in troponin at [facility] in the setting of sig resp distress.  Wheezing - cause unclear, hx tobacco use, but no history of wheezing, not on inhalers/nebs. Likely has a component of COPD, MD advise on adding inhaler.    Lipids - no check since July, do liver/lipids as add-on.  Plan - pt feels cath is next step, discuss with MD.  PA time with patient 15 minutes Signed, Rosaria Ferries, PA-C 12:12 PM 04/29/2014   ATTENDING ATTESTATION:  I have seen and evaluated the patient this morning along with Rosaria Ferries, PA. I reviewed all the available data in chart  I agree with her findings, examination as well as impression recommendations.  The patient is a long-standing patient of Dr. Shelva Majestic who has been titrating his antianginal regimen as an outpatient. He was admitted for acute onset dyspnea with probable acute diastolic heart failure related dyspnea on Friday night to [facility]. He had a elevated troponin level at that time. He was  transferred late last night to [facility] and seen by Dr. Wynonia Lawman who agreed with Dr. Lucy Chris last note that the next step if there is a recurrence of symptoms would be just simply to proceed with cardiac catheterization. He remains stable with no further episodes of either anginal chest pain or significant dyspnea. He is on IV heparin and breathing comfortably. No PND, orthopnea or edema. He does have a history of tobacco use and could potentially have a COPD component however his lungs do not sound bad today. There was a question of possible infiltrate on his initial CT scan done at [facility] which does not seem apparent on exam and he does not have an elevated white blood cell count or other symptoms of pneumonia.  He is on beta blocker, ACE inhibitor, statin as well as Ranexa. He is also on amlodipine meeting full requirements for high dose medical therapy of angina.  I think the best option is to plan cardiac catheterization. Unfortunately there are no slots available on the car to Schedule for today. We'll post him for tomorrow morning.  I reviewed the risks benefits alternatives and indications the patient.  All questions were answered.    Risks / Complications include, but not limited to: Death, MI, CVA/TIA, VF/VT (with defibrillation), Bradycardia (need for temporary pacer placement), contrast induced nephropathy, bleeding / bruising / hematoma / pseudoaneurysm, vascular or coronary injury (with possible emergent CT or Vascular Surgery), adverse medication reactions, infection.  Additional risks involving the use of radiation with the possibility of radiation burns and cancer were explained in detail.  The patient  and family) voice understanding and agree to proceed.     MD Time with pt: 15 min  HARDING,DAVID W, M.D., M.S. Interventional Cardiologist   Pager # [phone]

## 2014-04-30 NOTE — Progress Notes (Addendum)
[id number] Cardiac Rehab On arrival pt sitting on side of bed with his clothes on ready to go home. I completed MI and CHF education with pt and wife. They voice understanding. I discussed smoking cessation with pt. He is not committed to quitting. I gave him tips for quitting, coaching contact number and quit smart class information. Pt admits that he does not exercise due to all of his multiple bone surgeries. He states that all exercise causes him pain. I have encouraged him to try to find a pool and that water would probably be the easiest on his joints. We discussed heart heathy and low sodium diet. He admits to overeating. I encouraged him to cut back him portion sizes and to make better choices. He is not motivated to quit smoking or to try exercise that may work for him. Pt seems more committed to working on his diet and weight loss.He declines Outpt. CRP, states that he is not physically able to do it. Deon Pilling, RN 04/30/2014 3:10 PM

## 2014-04-30 NOTE — Discharge Instructions (Signed)
PLEASE REMEMBER TO BRING ALL OF YOUR MEDICATIONS TO EACH OF YOUR FOLLOW-UP OFFICE VISITS. ° °PLEASE ATTEND ALL SCHEDULED FOLLOW-UP APPOINTMENTS.  ° °Activity: Increase activity slowly as tolerated. You may shower, but no soaking baths (or swimming) for 1 week. No driving for 2 days. No lifting over 5 lbs for 1 week. No sexual activity for 1 week.  ° °You May Return to Work: in 1 week (if applicable) ° °Wound Care: You may wash cath site gently with soap and water. Keep cath site clean and dry. If you notice pain, swelling, bleeding or pus at your cath site, please call [phone]. ° ° ° °Cardiac Cath Site Care °Refer to this sheet in the next few weeks. These instructions provide you with information on caring for yourself after your procedure. Your caregiver may also give you more specific instructions. Your treatment has been planned according to current medical practices, but problems sometimes occur. Call your caregiver if you have any problems or questions after your procedure. °HOME CARE INSTRUCTIONS °· You may shower 24 hours after the procedure. Remove the bandage (dressing) and gently wash the site with plain soap and water. Gently pat the site dry.  °· Do not apply powder or lotion to the site.  °· Do not sit in a bathtub, swimming pool, or whirlpool for 5 to 7 days.  °· No bending, squatting, or lifting anything over 10 pounds (4.5 kg) as directed by your caregiver.  °· Inspect the site at least twice daily.  °· Do not drive home if you are discharged the same day of the procedure. Have someone else drive you.  °· You may drive 24 hours after the procedure unless otherwise instructed by your caregiver.  °What to expect: °· Any bruising will usually fade within 1 to 2 weeks.  °· Blood that collects in the tissue (hematoma) may be painful to the touch. It should usually decrease in size and tenderness within 1 to 2 weeks.  °SEEK IMMEDIATE MEDICAL CARE IF: °· You have unusual pain at the site or down the  affected limb.  °· You have redness, warmth, swelling, or pain at the site.  °· You have drainage (other than a small amount of blood on the dressing).  °· You have chills.  °· You have a fever or persistent symptoms for more than 72 hours.  °· You have a fever and your symptoms suddenly get worse.  °· Your leg becomes pale, cool, tingly, or numb.  °· You have heavy bleeding from the site. Hold pressure on the site.  °Document Released: 06/05/2010 Document Revised: 04/22/2011 Document Reviewed: 06/05/2010 °ExitCare® Patient Information ©2012 [employer]. ° °

## 2014-05-13 ENCOUNTER — Ambulatory Visit (INDEPENDENT_AMBULATORY_CARE_PROVIDER_SITE_OTHER): Payer: Medicare Other | Admitting: Cardiovascular Disease

## 2014-05-13 ENCOUNTER — Encounter: Payer: Self-pay | Admitting: Cardiovascular Disease

## 2014-05-13 VITALS — BP 138/70 | HR 65 | Ht 62.0 in | Wt 179.8 lb

## 2014-05-13 DIAGNOSIS — I252 Old myocardial infarction: Secondary | ICD-10-CM

## 2014-05-13 DIAGNOSIS — I2510 Atherosclerotic heart disease of native coronary artery without angina pectoris: Secondary | ICD-10-CM

## 2014-05-13 DIAGNOSIS — E785 Hyperlipidemia, unspecified: Secondary | ICD-10-CM

## 2014-05-13 DIAGNOSIS — Z720 Tobacco use: Secondary | ICD-10-CM

## 2014-05-13 DIAGNOSIS — I214 Non-ST elevation (NSTEMI) myocardial infarction: Secondary | ICD-10-CM

## 2014-05-13 DIAGNOSIS — I10 Essential (primary) hypertension: Secondary | ICD-10-CM

## 2014-05-13 MED ORDER — AMLODIPINE BESYLATE 5 MG PO TABS: 7.5000 mg | ORAL_TABLET | Freq: Every day | ORAL | Status: DC

## 2014-05-13 NOTE — Patient Instructions (Signed)
Change dose of medication-AMLODIPINE 7.5 MG (1  AND 1/2 TABLETS ) DAILY.   Your physician wants you to follow-up in [place].  You will receive a reminder letter in the mail two months in advance. If you don't receive a letter, please call our office to schedule the follow-up appointment.

## 2014-05-15 NOTE — Progress Notes (Signed)
Patient ID: Robert Melton, male   DOB: 02-10-1951, 63 y.o.   MRN: [id number]     HPI: Robert Melton  is a 63 y.o. male who presents to the office today for a followup cardiology evaluation following his recent  Robert Melton has known CAD and in 1993 suffered a myocardial infarction treated with PTCA at [facility]. In December 2009 he was found to have progressive CAD and underwent CABG surgery by Dr. Roxan Hockey (LIMA to the LAD, free RIMA graft to the OM1, and sequential vein graft to the PDA and PLA segment). In June 2011 repeat catheterization for recurrent chest pain showed 99% stenosis at the anastomosis of the vein graft to his right coronary artery. He also had an occluded sequential limb which had previously supplied the PLA vessel. He underwent successful PTCA of the 99% anastomosis stenosis to the PDA at that time and felt markedly improved. In June 2014, he experienced increasing episodes of recurrent anginal symptomatology. He was hospitalized with unstable angina on 11/29/2012 underwent repeat catheterization which showed low normal ejection fraction at 50%. He had significant native CAD with calcified LAD and total occlusion proximally. The was a 90% proximal circumflex stenosis with 70% stenosis in the OM1 vessel. Right coronary artery was diffusely diseased and calcified with 3040 and 70% proximal mid acute marginal stenoses as well as a 40% PLA stenosis. He had been LIMA graft to the LAD, a patent  RIMA graft to the OM1 vessel, and a patent vein graft to the PDA without evidence for restenosis at the anastomosis site but again he had an occluded sequential limb.  During that hospitalization we had increased his medical regimen. He has noticed marked improvement in his prior symptomatology but he still has experienced some episodes of chest pain. An echo Doppler revealed ejection fraction 55-60% with mild aortic valve sclerosis without stenosis, trivial tricuspid and pulmonic regurgitation. His  chest pain symptoms  significantly improved with the addition of Ranexa initially at 500 twice a day.  Subsequently, I recommended further titration to 1000 twice a day.  Laboratory  From this summer revealed anormal chemistry and CBC profiles.  Lipids studies revealed  elevated triglycerides at 209, HDL 28, and VLDL at 42.  Total cholesterol was 139 and LDL cholesterol of 69.  He has continued to smoke cigarettes.  Since I last saw him in November, he was readmitted to [facility] hospital in transfer from [facility] after development of sudden onset of severe shortness of breath on 04/28/2014.  He was noted to have mild troponin elevation due to concern for possible non-ST segment MI.  He was transferred to [facility] hospital.  Subsequent enzymes were negative.  He was treated effectively with nebulizers and his respiratory status improved.  A CT angiogram at [facility] did not show any PE.  However, there was concern for infiltrate and some small pulmonary nodules for which a follow-up CT in one year was recommended.  He underwent repeat cardiac catheterization was done by Dr. Tamala Julian which essentially was unchanged from his previous catheterization and showed severe native CAD with total occlusion of the LAD, high-grade obstruction in the proximal mid circumflex, high-grade, diffuse calcified disease in the proximal to distal RCA.  He had a widely patent LIMA to the LAD, a free RIMA to the obtuse marginal, and SVG to the PDA with unchanged appearance from one year previously.  Since hospital discharge, unfortunately, continues to smoke cigarettes.  He denies any recent anginal symptomatology on his current medical  regimen now consisting of amlodipine 5 mg, aspirin 81 mg, Plavix 75 mg, Imdur 120 mg, lisinopril 20 mg, metoprolol, tartrate 25 mg twice a day, and ranolazine 1000 g twice a day.  He also takes Protonix for GERD.  He takes Claritin 10 mg for allergies.  Past Medical History  Diagnosis Date  .  CAD (coronary artery disease)   . History of MI (myocardial infarction) 1993    PTCA at [facility] with stenting  . Hypertension   . Hyperlipidemia   . GERD (gastroesophageal reflux disease)   . Broken neck 2008    fall from ladder  . History of echocardiogram 10/29/2009    EF 16-10%; LV systolic function normal; mildly sclerotic AV  . History of nuclear stress test 08/21/2008    dipyridamole; negative for ischemia; normal study   . History of Doppler ultrasound 04/13/2006    LEAs; bilat ABIs - no evidence of arterial insuff.; bilat PVRs - normal; irregular non-hemodynamically significant plaque bilat in SFA  . Myocardial infarction 04/2014  . Arthritis     RA    Past Surgical History  Procedure Laterality Date  . Coronary artery bypass graft  2009    revascularization by Dr. Koleen Nimrod; LIMA to LAD, free RIMA graft to OM1, sequential vein gradt to PDA & PL segment  . Lumbar spine surgery  2004    s/p fusion L4-S1; 4 back surgeries  . Parathyroidectomy  2001  . Knee surgery      Left Knee (1992), Right Knee (1998);   Marland Kitchen Carpal tunnel release  2000  . Neck fusion  1999  . Skin cancer excision  1999    face  . Left heart catheterization with coronary/graft angiogram N/A 11/29/2012    Procedure: LEFT HEART CATHETERIZATION WITH Beatrix Fetters;  Surgeon: Troy Sine, MD;  Location: [facility] CATH LAB;  Service: Cardiovascular;  Laterality: N/A;  . Cardiac catheterization  10/24/2009    r/t CP; significant native CAD, patent LIMA graft to LAD & patent RIMA to obtuse marginal vessel; graft to RCA 99% stenosed at anastomses; stenting to RCA reduced to 20-30%  . Cardiac catheterization  04/30/2014  . Left heart catheterization with coronary/graft angiogram N/A 04/30/2014    Procedure: LEFT HEART CATHETERIZATION WITH Beatrix Fetters;  Surgeon: Sinclair Grooms, MD;  Location[facility] C[facility] LAB;  Service: Cardiovascular;  Laterality: N/A;    No Known Allergies  Current Outpatient  Prescriptions  Medication Sig Dispense Refill  . aspirin EC 81 MG tablet Take 81 mg by mouth daily.    Marland Kitchen atorvastatin (LIPITOR) 40 MG tablet Take 40 mg by mouth daily.    . clopidogrel (PLAVIX) 75 MG tablet Take 1 tablet (75 mg total) by mouth daily. 30 tablet 6  . docusate sodium (COLACE) 100 MG capsule Take 100 mg by mouth daily.    . isosorbide mononitrate (IMDUR) 120 MG 24 hr tablet Take 1 tablet (120 mg total) by mouth daily. 30 tablet 6  . lisinopril (PRINIVIL,ZESTRIL) 20 MG tablet Take 20 mg by mouth daily.    Marland Kitchen loratadine (CLARITIN) 10 MG tablet Take 10 mg by mouth daily as needed for allergies.    . metoprolol tartrate (LOPRESSOR) 25 MG tablet Take 1 tablet (25 mg total) by mouth 2 (two) times daily. 180 tablet 3  . nitroGLYCERIN (NITROSTAT) 0.4 MG SL tablet Place 0.4 mg under the tongue every 5 (five) minutes as needed for chest pain.    . pantoprazole (PROTONIX) 40 MG tablet Take 40  mg by mouth daily.     . ranolazine (RANEXA) 1000 MG SR tablet Take 1 tablet (1,000 mg total) by mouth 2 (two) times daily. 42 tablet 0  . amLODipine (NORVASC) 5 MG tablet Take 1.5 tablets (7.5 mg total) by mouth daily. 135 tablet 3   No current facility-administered medications for this visit.    Socially he is married has 4 children 10 grandchildren. There is no alcohol use.  He is resuming smoking for the past year.  ROS General: Negative; No fevers, chills, or night sweats;  HEENT: Negative; No changes in vision or hearing, sinus congestion, difficulty swallowing Pulmonary: Negative; No cough, wheezing, shortness of breath, hemoptysis Cardiovascular: See history of present illness; presyncope, syncope, palpitations GI: Negative; No nausea, vomiting, diarrhea, or abdominal pain GU: Negative; No dysuria, hematuria, or difficulty voiding Musculoskeletal: Positive for hip discomfort, but typically with; no myalgias, joint pain, or weakness Hematologic/Oncology: Negative; no easy bruising,  bleeding Endocrine: Negative; no heat/cold intolerance; no diabetes Neuro: Negative; no changes in balance, headaches Skin: Negative; No rashes or skin lesions Psychiatric: Negative; No behavioral problems, depression Sleep: Negative; No snoring, daytime sleepiness, hypersomnolence, bruxism, restless legs, hypnogognic hallucinations, no cataplexy Other comprehensive 14 point system review is negative.  PE BP 138/70 mmHg  Pulse 65  Ht 5\' 2"  (1.575 m)  Wt 179 lb 12.8 oz (81.557 kg)  BMI 32.88 kg/m2  General: Alert, oriented, no distress.  Skin: normal turgor, no rashes HEENT: Normocephalic, atraumatic. Pupils round and reactive; sclera anicteric;no lid lag.  Nose without nasal septal hypertrophy Mouth/Parynx benign; Mallinpatti scale 3 Neck: No JVD, no carotid bruits with normal carotid Lungs: clear to ausculatation and percussion; no wheezing or rales Chest wall: Nontender to palpation Heart: RRR, s1 s2 normal 1/6 systolic murmur, no diastolic murmur.  No S3 or S4 gallop.  No rubs thrills or heaves. Abdomen: soft, nontender; no hepatosplenomehaly, BS+; abdominal aorta nontender and not dilated by palpation. Pulses 2+ ; specifically, distal pulses are 2+. Back: No CVA tenderness Extremities: no clubbing cyanosis or edema, Homan's sign negative  Neurologic: grossly nonfocal Psychological: Normal affect and mood  ECG (independently read by me): Normal sinus rhythm at 65 bpm.  Probable left atrial enlargement.  Previously noted ST-T changes anterolaterally  November  2015 ECG (independently read by me): Sinus bradycardia 51 bpm.  ST-T wave changes laterally  V3 through V6.  QTc interval 409 ms.  August 2015 ECG (independently read by me): Sinus bradycardia 51 beats per minute.  Previously noted T wave abnormality V3-6.  QTc interval normal  09/13/2013 ECG (independently read by me as): Sinus rhythm at 57 beats per minute.  T wave abnormality in V4 through V6 and mildly in 1 and L.  which have been present previously, but perhaps may be slightly increased in V5, V6, compared to prior tracing.  Prior 03/16/2013 ECG: Sinus bradycardia at 57 beats per minute. Nonspecific ST abnormalities.  QTc interval 455 msec.  LABS:  BMET    Component Value Date/Time   NA 141 04/29/2014 0419   K 4.6 04/29/2014 0419   CL 105 04/29/2014 0419   CO2 24 04/29/2014 0419   GLUCOSE 105* 04/29/2014 0419   BUN 13 04/29/2014 0419   CREATININE 0.88 04/29/2014 0419   CREATININE 0.95 12/14/2013 0923   CALCIUM 9.7 04/29/2014 0419   GFRNONAA 89* 04/29/2014 0419   GFRAA >90 04/29/2014 0419     Hepatic Function Panel     Component Value Date/Time   PROT 6.5 04/29/2014  0420   ALBUMIN 3.4* 04/29/2014 0420   AST 14 04/29/2014 0420   ALT 15 04/29/2014 0420   ALKPHOS 86 04/29/2014 0420   BILITOT 0.5 04/29/2014 0420   BILIDIR <0.2 04/29/2014 0420   IBILI NOT CALCULATED 04/29/2014 0420     CBC    Component Value Date/Time   WBC 6.7 04/30/2014 0433   RBC 4.36 04/30/2014 0433   HGB 13.2 04/30/2014 0433   HCT 39.3 04/30/2014 0433   PLT 136* 04/30/2014 0433   MCV 90.1 04/30/2014 0433   MCH 30.3 04/30/2014 0433   MCHC 33.6 04/30/2014 0433   RDW 13.1 04/30/2014 0433   LYMPHSABS 2.0 02/29/2008 1215   MONOABS 0.4 02/29/2008 1215   EOSABS 0.3 02/29/2008 1215   BASOSABS 0.1 02/29/2008 1215     BNP No results found for: PROBNP  Lipid Panel     Component Value Date/Time   CHOL 135 04/29/2014 0713   TRIG 128 04/29/2014 0713   HDL 40 04/29/2014 0713   CHOLHDL 3.4 04/29/2014 0713   VLDL 26 04/29/2014 0713   LDLCALC 69 04/29/2014 0713     RADIOLOGY: Dg Chest 2 View  11/27/2012   *RADIOLOGY REPORT*  Clinical Data: Chest pain and shortness of breath.  Preop cardiac cath.  CHEST - 2 VIEW  Comparison: 10/21/2009.  Findings: The cardiac silhouette, mediastinal and hilar contours are normal and stable.  Stable surgical changes from bypass surgery.  The lungs are clear.  No pleural  effusion.  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  IMPRESSION: No acute cardiopulmonary findings.   Original Report Authenticated By: Marijo Sanes, M.D.      ASSESSMENT AND PLAN: Robert Melton is a 63 year old male who has established coronary artery disease and suffered initial myocardial infarction  22 years ago. His  cardiac catheterization 1 year ago has shown patent grafts with previously demonstrated old sequential limb occlusion of the graft from the PDA to the PLA vessel. He does have potential sources of native vessel ischemia not supplied well by the grafts.  He has noticed significant improvement in his symptoms with the addition of Ranexa and, now he's been taking the maximum dose at 1000 mg twice a day.  He still experiences episodes of chest discomfort.  He develop recurrent increasing shortness of breath and underwent repeat cardiac catheterization several weeks ago which essentially was unchanged from one year previously.  I'm recommending further titration of his amlodipine to 7.5 mg, which should be helpful both for blood pressure as well as improved antianginal benefit.  He is on the maximum dose of Ranexa 1000 twice a day.  He is tolerating isosorbide 120 mg without headache.  I again had a long discussion with him concerning the absolute importance of smoking cessation.  He has hyperlipidemia and continues to take Lipitor 40 mg per day.  In the past.  He was unable to tolerate Niaspan, which was started when his HDL was very low.  His HDL is now 40.  I personally reviewed his hospital records from his recent hospitalization.  I will see him in 3 months for follow-up neurologic evaluation.   Time spent: 18minutes   Troy Sine, MD, [facility]  05/15/2014 5:00 PM

## 2014-07-10 ENCOUNTER — Telehealth: Payer: Self-pay | Admitting: Cardiovascular Disease

## 2014-07-11 NOTE — Telephone Encounter (Signed)
Close encounter 

## 2014-07-25 ENCOUNTER — Ambulatory Visit: Payer: Self-pay | Admitting: Cardiovascular Disease

## 2014-08-02 ENCOUNTER — Telehealth: Payer: Self-pay | Admitting: Cardiovascular Disease

## 2014-08-05 NOTE — Telephone Encounter (Signed)
Closed encounter °

## 2014-08-07 ENCOUNTER — Encounter: Payer: Self-pay | Admitting: *Deleted

## 2014-08-07 ENCOUNTER — Other Ambulatory Visit: Payer: Self-pay | Admitting: *Deleted

## 2014-08-07 DIAGNOSIS — I25118 Atherosclerotic heart disease of native coronary artery with other forms of angina pectoris: Secondary | ICD-10-CM

## 2014-08-07 DIAGNOSIS — I10 Essential (primary) hypertension: Secondary | ICD-10-CM

## 2014-08-07 DIAGNOSIS — E669 Obesity, unspecified: Secondary | ICD-10-CM

## 2014-08-07 DIAGNOSIS — E785 Hyperlipidemia, unspecified: Secondary | ICD-10-CM

## 2014-09-02 ENCOUNTER — Other Ambulatory Visit: Payer: Self-pay | Admitting: Cardiovascular Disease

## 2014-09-02 NOTE — Telephone Encounter (Signed)
Rx(s) sent to pharmacy electronically.  

## 2014-09-03 LAB — CBC
HEMATOCRIT: 44.3 % (ref 39.0–52.0)
HEMOGLOBIN: 14.6 g/dL (ref 13.0–17.0)
MCH: 31.7 pg (ref 26.0–34.0)
MCHC: 33 g/dL (ref 30.0–36.0)
MCV: 96.1 fL (ref 78.0–100.0)
MPV: 8.8 fL (ref 8.6–12.4)
Platelets: 163 10*3/uL (ref 150–400)
RBC: 4.61 MIL/uL (ref 4.22–5.81)
RDW: 13.5 % (ref 11.5–15.5)
WBC: 6.8 10*3/uL (ref 4.0–10.5)

## 2014-09-03 LAB — LIPID PANEL
CHOL/HDL RATIO: 4.9 ratio
CHOLESTEROL: 136 mg/dL (ref 0–200)
HDL: 28 mg/dL — AB (ref >=40)
LDL Cholesterol: 59 mg/dL (ref 0–99)
Triglycerides: 243 mg/dL — ABNORMAL HIGH (ref <150)
VLDL: 49 mg/dL — ABNORMAL HIGH (ref 0–40)

## 2014-09-03 LAB — COMPREHENSIVE METABOLIC PANEL
ALK PHOS: 92 U/L (ref 39–117)
ALT: 12 U/L (ref 0–53)
AST: 13 U/L (ref 0–37)
Albumin: 4 g/dL (ref 3.5–5.2)
BILIRUBIN TOTAL: 0.5 mg/dL (ref 0.2–1.2)
BUN: 14 mg/dL (ref 6–23)
CO2: 28 meq/L (ref 19–32)
CREATININE: 0.92 mg/dL (ref 0.50–1.35)
Calcium: 9.4 mg/dL (ref 8.4–10.5)
Chloride: 103 mEq/L (ref 96–112)
GLUCOSE: 97 mg/dL (ref 70–99)
Potassium: 4.5 mEq/L (ref 3.5–5.3)
SODIUM: 138 meq/L (ref 135–145)
Total Protein: 6.7 g/dL (ref 6.0–8.3)

## 2014-09-03 LAB — TSH: TSH: 1.828 u[IU]/mL (ref 0.350–4.500)

## 2014-09-05 ENCOUNTER — Encounter: Payer: Self-pay | Admitting: Cardiovascular Disease

## 2014-09-05 ENCOUNTER — Ambulatory Visit (INDEPENDENT_AMBULATORY_CARE_PROVIDER_SITE_OTHER): Payer: Medicare Other | Admitting: Cardiovascular Disease

## 2014-09-05 VITALS — BP 104/64 | HR 55 | Ht 62.0 in | Wt 175.4 lb

## 2014-09-05 DIAGNOSIS — I25810 Atherosclerosis of coronary artery bypass graft(s) without angina pectoris: Secondary | ICD-10-CM

## 2014-09-05 DIAGNOSIS — I252 Old myocardial infarction: Secondary | ICD-10-CM | POA: Diagnosis not present

## 2014-09-05 DIAGNOSIS — Z720 Tobacco use: Secondary | ICD-10-CM | POA: Diagnosis not present

## 2014-09-05 DIAGNOSIS — I10 Essential (primary) hypertension: Secondary | ICD-10-CM | POA: Diagnosis not present

## 2014-09-05 DIAGNOSIS — E785 Hyperlipidemia, unspecified: Secondary | ICD-10-CM

## 2014-09-05 DIAGNOSIS — E669 Obesity, unspecified: Secondary | ICD-10-CM

## 2014-09-05 DIAGNOSIS — E66811 Obesity, class 1: Secondary | ICD-10-CM

## 2014-09-05 DIAGNOSIS — I2510 Atherosclerotic heart disease of native coronary artery without angina pectoris: Secondary | ICD-10-CM

## 2014-09-05 MED ORDER — RANOLAZINE ER 1000 MG PO TB12: 500.0000 mg | ORAL_TABLET | Freq: Two times a day (BID) | ORAL | Status: DC

## 2014-09-05 MED ORDER — METOPROLOL TARTRATE 25 MG PO TABS: ORAL_TABLET | ORAL | Status: DC

## 2014-09-05 NOTE — Patient Instructions (Signed)
Your physician has recommended you make the following change in your medication: the lopressor has been changed to 1 tablet in the morning and 1/2 tablet in the evening.  Your physician wants you to follow-up in: 6 months or sooner if needed. You will receive a reminder letter in the mail two months in advance. If you don't receive a letter, please call our office to schedule the follow-up appointment.

## 2014-09-08 ENCOUNTER — Encounter: Payer: Self-pay | Admitting: Cardiovascular Disease

## 2014-09-08 NOTE — Progress Notes (Signed)
Patient ID: Robert Melton, male   DOB: [birth year], 64 y.o.   MRN: [id number]     HPI: Robert Melton  is a 64 y.o. male who presents to the office today for a 4 month followup cardiology evaluation.  Robert Melton has known CAD and in 1993 suffered a myocardial infarction treated with PTCA at [facility]. In December 2009 he was found to have progressive CAD and underwent CABG surgery by Dr. Roxan Hockey (LIMA to the LAD, free RIMA graft to the OM1, and sequential vein graft to the PDA and PLA segment). In June 2011 repeat catheterization for recurrent chest pain showed 99% stenosis at the anastomosis of the vein graft to his right coronary artery. He also had an occluded sequential limb which had previously supplied the PLA vessel. He underwent successful PTCA of the 99% anastomosis stenosis to the PDA at that time and felt markedly improved. In June 2014, he experienced increasing episodes of recurrent anginal symptomatology. He was hospitalized with unstable angina on 11/29/2012 underwent repeat catheterization which showed low normal ejection fraction at 50%. He had significant native CAD with calcified LAD and total occlusion proximally. The was a 90% proximal circumflex stenosis with 70% stenosis in the OM1 vessel. RCA was diffusely diseased and calcified with 30-40 and 70% proximal mid acute marginal stenoses as well as a 40% PLA stenosis. He had been LIMA graft to the LAD, a patent  RIMA graft to the OM1 vessel, and a patent vein graft to the PDA without evidence for restenosis at the anastomosis site but again he had an occluded sequential limb.  During that hospitalization we had increased his medical regimen. He has noticed marked improvement in his prior symptomatology but he still has experienced some episodes of chest pain. An echo Doppler revealed ejection fraction 55-60% with mild aortic valve sclerosis without stenosis, trivial tricuspid and pulmonic regurgitation. His chest pain symptoms   significantly improved with the addition of Ranexa initially at 500 twice a day.  Subsequently, I recommended further titration to 1000 twice a day.  He was readmitted to [facility] hospital in transfer from [facility] after development of sudden onset of severe shortness of breath on 04/28/2014.  He was noted to have mild troponin elevation due to concern for possible non-ST segment MI.  He was transferred to [facility] hospital.  Subsequent enzymes were negative.  He was treated effectively with nebulizers and his respiratory status improved.  A CT angiogram at [facility] did not show any PE.  However, there was concern for infiltrate and some small pulmonary nodules for which a follow-up CT in one year was recommended.  He underwent repeat cardiac catheterization by Dr. Tamala Julian which essentially was unchanged from his previous catheterization and showed severe native CAD with total occlusion of the LAD, high-grade obstruction in the proximal mid circumflex, high-grade, diffuse calcified disease in the proximal to distal RCA.  He had a widely patent LIMA to the LAD, a free RIMA to the obtuse marginal, and SVG to the PDA with unchanged appearance from one year previously.  Since hospital discharge, unfortunately, continues to smoke cigarettes.  He denies any recent anginal symptomatology on his current medical regimen now consisting of amlodipine 5 mg, aspirin 81 mg, Plavix 75 mg, Imdur 120 mg, lisinopril 20 mg, metoprolol, tartrate 25 mg twice a day, and ranolazine 1000 g twice a day.  He also takes Protonix for GERD.  He takes Claritin 10 mg for allergies. When I last saw him, I had titrated  his amlodipine to 7.5 mg.  Unfortunately, he did not quit smoking and continues to smoke one half pack per cigarettes per day.  He has noticed some rash focally in the right tibial region.  He denies recurrent anginal symptoms.  He presents for reevaluation.  Past Medical History  Diagnosis Date  . CAD (coronary artery  disease)   . History of MI (myocardial infarction) 1993    PTCA at [facility] with stenting  . Hypertension   . Hyperlipidemia   . GERD (gastroesophageal reflux disease)   . Broken neck 2008    fall from ladder  . History of echocardiogram 10/29/2009    EF 30-86%; LV systolic function normal; mildly sclerotic AV  . History of nuclear stress test 08/21/2008    dipyridamole; negative for ischemia; normal study   . History of Doppler ultrasound 04/13/2006    LEAs; bilat ABIs - no evidence of arterial insuff.; bilat PVRs - normal; irregular non-hemodynamically significant plaque bilat in SFA  . Myocardial infarction 04/2014  . Arthritis     RA    Past Surgical History  Procedure Laterality Date  . Coronary artery bypass graft  2009    revascularization by Dr. Koleen Nimrod; LIMA to LAD, free RIMA graft to OM1, sequential vein gradt to PDA & PL segment  . Lumbar spine surgery  2004    s/p fusion L4-S1; 4 back surgeries  . Parathyroidectomy  2001  . Knee surgery      Left Knee (1992), Right Knee (1998);   Marland Kitchen Carpal tunnel release  2000  . Neck fusion  1999  . Skin cancer excision  1999    face  . Left heart catheterization with coronary/graft angiogram N/A 11/29/2012    Procedure: LEFT HEART CATHETERIZATION WITH Beatrix Fetters;  Surgeon: Troy Sine, MD;  Location: [facility] CATH LAB;  Service: Cardiovascular;  Laterality: N/A;  . Cardiac catheterization  10/24/2009    r/t CP; significant native CAD, patent LIMA graft to LAD & patent RIMA to obtuse marginal vessel; graft to RCA 99% stenosed at anastomses; stenting to RCA reduced to 20-30%  . Cardiac catheterization  04/30/2014  . Left heart catheterization with coronary/graft angiogram N/A 04/30/2014    Procedure: LEFT HEART CATHETERIZATION WITH Beatrix Fetters;  Surgeon: Sinclair Grooms, MD;  Location: [facility] CATH LAB;  Service: Cardiovascular;  Laterality: N/A;    No Known Allergies  Current Outpatient Prescriptions  Medication  Sig Dispense Refill  . amLODipine (NORVASC) 5 MG tablet Take 1.5 tablets (7.5 mg total) by mouth daily. 135 tablet 3  . aspirin EC 81 MG tablet Take 81 mg by mouth daily.    Marland Kitchen atorvastatin (LIPITOR) 40 MG tablet Take 40 mg by mouth daily.    . clopidogrel (PLAVIX) 75 MG tablet TAKE ONE TABLET BY MOUTH ONCE DAILY 30 tablet 7  . docusate sodium (COLACE) 100 MG capsule Take 100 mg by mouth daily.    Marland Kitchen HYDROcodone-acetaminophen (NORCO/VICODIN) 5-325 MG per tablet Take 1-2 tablets by mouth as needed.    . isosorbide mononitrate (IMDUR) 120 MG 24 hr tablet Take 1 tablet (120 mg total) by mouth daily. 30 tablet 6  . lisinopril (PRINIVIL,ZESTRIL) 20 MG tablet Take 20 mg by mouth daily.    Marland Kitchen loratadine (CLARITIN) 10 MG tablet Take 10 mg by mouth daily as needed for allergies.    . metoprolol tartrate (LOPRESSOR) 25 MG tablet 1 Tablet in the morning and 1/2 tablet in the evening. 45 tablet 11  . nitroGLYCERIN (  NITROSTAT) 0.4 MG SL tablet Place 0.4 mg under the tongue every 5 (five) minutes as needed for chest pain.    . pantoprazole (PROTONIX) 40 MG tablet Take 40 mg by mouth daily.     . ranolazine (RANEXA) 1000 MG SR tablet Take 1 tablet (1,000 mg total) by mouth 2 (two) times daily. 42 tablet 0  . ranolazine (RANEXA) 1000 MG SR tablet Take 1 tablet (1,000 mg total) by mouth 2 (two) times daily. 56 tablet 0   No current facility-administered medications for this visit.    Socially he is married has 4 children 10 grandchildren. There is no alcohol use.  He is resuming smoking for the past year.  ROS General: Negative; No fevers, chills, or night sweats;  HEENT: Negative; No changes in vision or hearing, sinus congestion, difficulty swallowing Pulmonary: Negative; No cough, wheezing, shortness of breath, hemoptysis Cardiovascular: See history of present illness; presyncope, syncope, palpitations GI: Negative; No nausea, vomiting, diarrhea, or abdominal pain GU: Negative; No dysuria, hematuria, or  difficulty voiding Musculoskeletal: Positive for hip discomfort, but typically with; no myalgias, joint pain, or weakness Hematologic/Oncology: Negative; no easy bruising, bleeding Endocrine: Negative; no heat/cold intolerance; no diabetes Neuro: Negative; no changes in balance, headaches Skin: Positive for rash, right pretibial lower extremity Psychiatric: Negative; No behavioral problems, depression Sleep: Negative; No snoring, daytime sleepiness, hypersomnolence, bruxism, restless legs, hypnogognic hallucinations, no cataplexy Other comprehensive 14 point system review is negative.  PE BP 104/64 mmHg  Pulse 55  Ht 5' 2"  (1.575 m)  Wt 175 lb 6.4 oz (79.561 kg)  BMI 32.07 kg/m2  General: Alert, oriented, no distress.  Skin: normal turgor, no rashes HEENT: Normocephalic, atraumatic. Pupils round and reactive; sclera anicteric;no lid lag.  Nose without nasal septal hypertrophy Mouth/Parynx benign; Mallinpatti scale 3 Neck: No JVD, no carotid bruits with normal carotid upstroke Lungs: clear to ausculatation and percussion; no wheezing or rales Chest wall: Nontender to palpation Heart: RRR, s1 s2 normal 1/6 systolic murmur, no diastolic murmur.  No S3 or S4 gallop.  No rubs thrills or heaves. Abdomen: soft, nontender; no hepatosplenomehaly, BS+; abdominal aorta nontender and not dilated by palpation. Pulses 2+ ; specifically, distal pulses are 2+. Back: No CVA tenderness Extremities: no clubbing cyanosis or edema, Homan's sign negative  Neurologic: grossly nonfocal Psychological: Normal affect and mood  ECG (independently read by me): Sinus bradycardia 55 bpm.  Incomplete right bundle branch block.  Lateral T-wave changes.  December 2015 ECG (independently read by me): Normal sinus rhythm at 65 bpm.  Probable left atrial enlargement.  Previously noted ST-T changes anterolaterally  November  2015 ECG (independently read by me): Sinus bradycardia 51 bpm.  ST-T wave changes laterally   V3 through V6.  QTc interval 409 ms.  August 2015 ECG (independently read by me): Sinus bradycardia 51 beats per minute.  Previously noted T wave abnormality V3-6.  QTc interval normal  09/13/2013 ECG (independently read by me as): Sinus rhythm at 57 beats per minute.  T wave abnormality in V4 through V6 and mildly in 1 and L. which have been present previously, but perhaps may be slightly increased in V5, V6, compared to prior tracing.  Prior 03/16/2013 ECG: Sinus bradycardia at 57 beats per minute. Nonspecific ST abnormalities.  QTc interval 455 msec.  LABS:  BMET  BMP Latest Ref Rng 09/02/2014 04/29/2014 12/14/2013  Glucose 70 - 99 mg/dL 97 105(H) 94  BUN 6 - 23 mg/dL 14 13 15   Creatinine 0.50 - 1.35 mg/dL  0.92 0.88 0.95  Sodium 135 - 145 mEq/L 138 141 142  Potassium 3.5 - 5.3 mEq/L 4.5 4.6 4.0  Chloride 96 - 112 mEq/L 103 105 108  CO2 19 - 32 mEq/L 28 24 24   Calcium 8.4 - 10.5 mg/dL 9.4 9.7 9.0   Hepatic Function Latest Ref Rng 09/02/2014 04/29/2014 12/14/2013  Total Protein 6.0 - 8.3 g/dL 6.7 6.5 6.3  Albumin 3.5 - 5.2 g/dL 4.0 3.4(L) 4.0  AST 0 - 37 U/L 13 14 14   ALT 0 - 53 U/L 12 15 13   Alk Phosphatase 39 - 117 U/L 92 86 85  Total Bilirubin 0.2 - 1.2 mg/dL 0.5 0.5 0.6  Bilirubin, Direct 0.0 - 0.3 mg/dL - <0.2 -   CBC Latest Ref Rng 09/02/2014 04/30/2014 04/29/2014  WBC 4.0 - 10.5 K/uL 6.8 6.7 6.1  Hemoglobin 13.0 - 17.0 g/dL 14.6 13.2 13.2  Hematocrit 39.0 - 52.0 % 44.3 39.3 39.1  Platelets 150 - 400 K/uL 163 136(L) 130(L)   Lab Results  Component Value Date   TSH 1.828 09/02/2014    Lipid Panel     Component Value Date/Time   CHOL 136 09/02/2014 0803   TRIG 243* 09/02/2014 0803   HDL 28* 09/02/2014 0803   CHOLHDL 4.9 09/02/2014 0803   VLDL 49* 09/02/2014 0803   LDLCALC 59 09/02/2014 0803     RADIOLOGY: Dg Chest 2 View  11/27/2012   *RADIOLOGY REPORT*  Clinical Data: Chest pain and shortness of breath.  Preop cardiac cath.  CHEST - 2 VIEW  Comparison:  10/21/2009.  Findings: The cardiac silhouette, mediastinal and hilar contours are normal and stable.  Stable surgical changes from bypass surgery.  The lungs are clear.  No pleural effusion.  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  IMPRESSION: No acute cardiopulmonary findings.   Original Report Authenticated By: Marijo Sanes, M.D.      ASSESSMENT AND PLAN: Mr. Philson is a 64 year old male who has established CAD and suffered initial myocardial infarction  22 years ago.  He has documented patent grafts with previously demonstrated old sequential limb occlusion of the graft from the PDA to the PLA vessel. He does have potential sources of native vessel ischemia not supplied well by the grafts.  He has noticed significant improvement in his symptoms with the addition of Ranexa and, now he's been taking the maximum dose at 1000 mg twice a day.  He has felt well without recurrent anginal symptoms on the increased dose of amlodipine at 7.5 mg.  His resting pulse is bradycardic at 55.  I am recommending he slightly reduce his metoprolol, tartrate and take 25 mg in the morning and 12.5 mg at night.  I again discussed the importance of smoking cessation.  I also added fish oil 2 capsules daily to his medical regimen in light of his elevated triglycerides at 243, and increased VLDL at 49.  I do not believe his focal right pretibial lower extremity rash is due to a medication drug reaction, but seems more contact dermatitis related.  I will see him in 6 months for reevaluation or sooner if problems arise.  Time spent: 16mnutes   TTroy Sine MD, [facility] 09/08/2014 1:05 PM

## 2014-09-09 ENCOUNTER — Ambulatory Visit: Payer: Self-pay | Admitting: Cardiovascular Disease

## 2014-09-20 ENCOUNTER — Other Ambulatory Visit: Payer: Self-pay | Admitting: Cardiovascular Disease

## 2014-09-20 NOTE — Telephone Encounter (Signed)
Rx(s) sent to pharmacy electronically.  

## 2014-10-03 ENCOUNTER — Other Ambulatory Visit: Payer: Self-pay | Admitting: Cardiovascular Disease

## 2014-10-04 NOTE — Telephone Encounter (Signed)
Rx(s) sent to pharmacy electronically.  

## 2014-10-25 IMAGING — CR DG CHEST 2V
2 series · 2 of 2 positions shown · non-contrast
Comparison: 10/21/2009.

CLINICAL DATA: Chest pain and shortness of breath.  Preop cardiac
cath.

CHEST - 2 VIEW

[w chest pa]
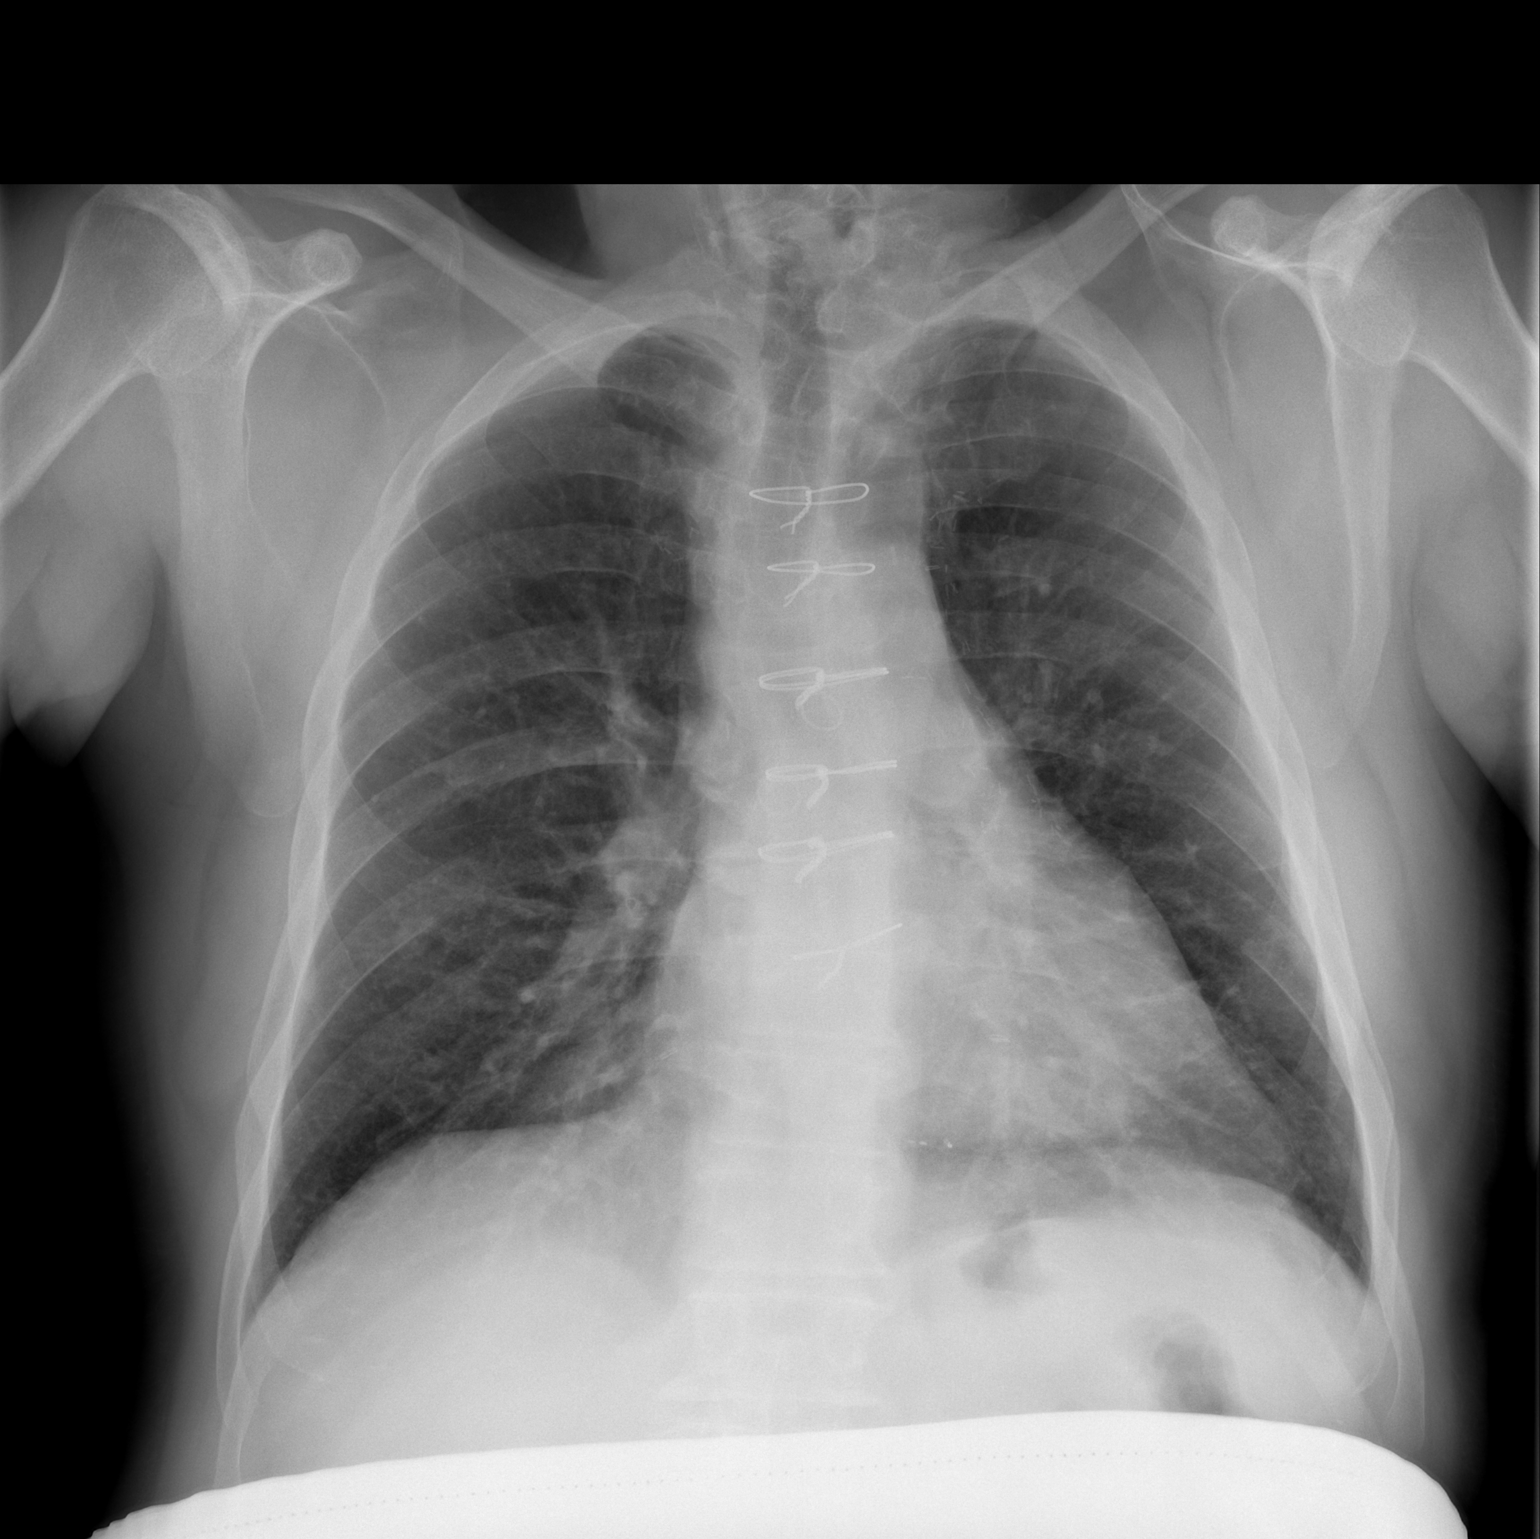

[w chest lat]
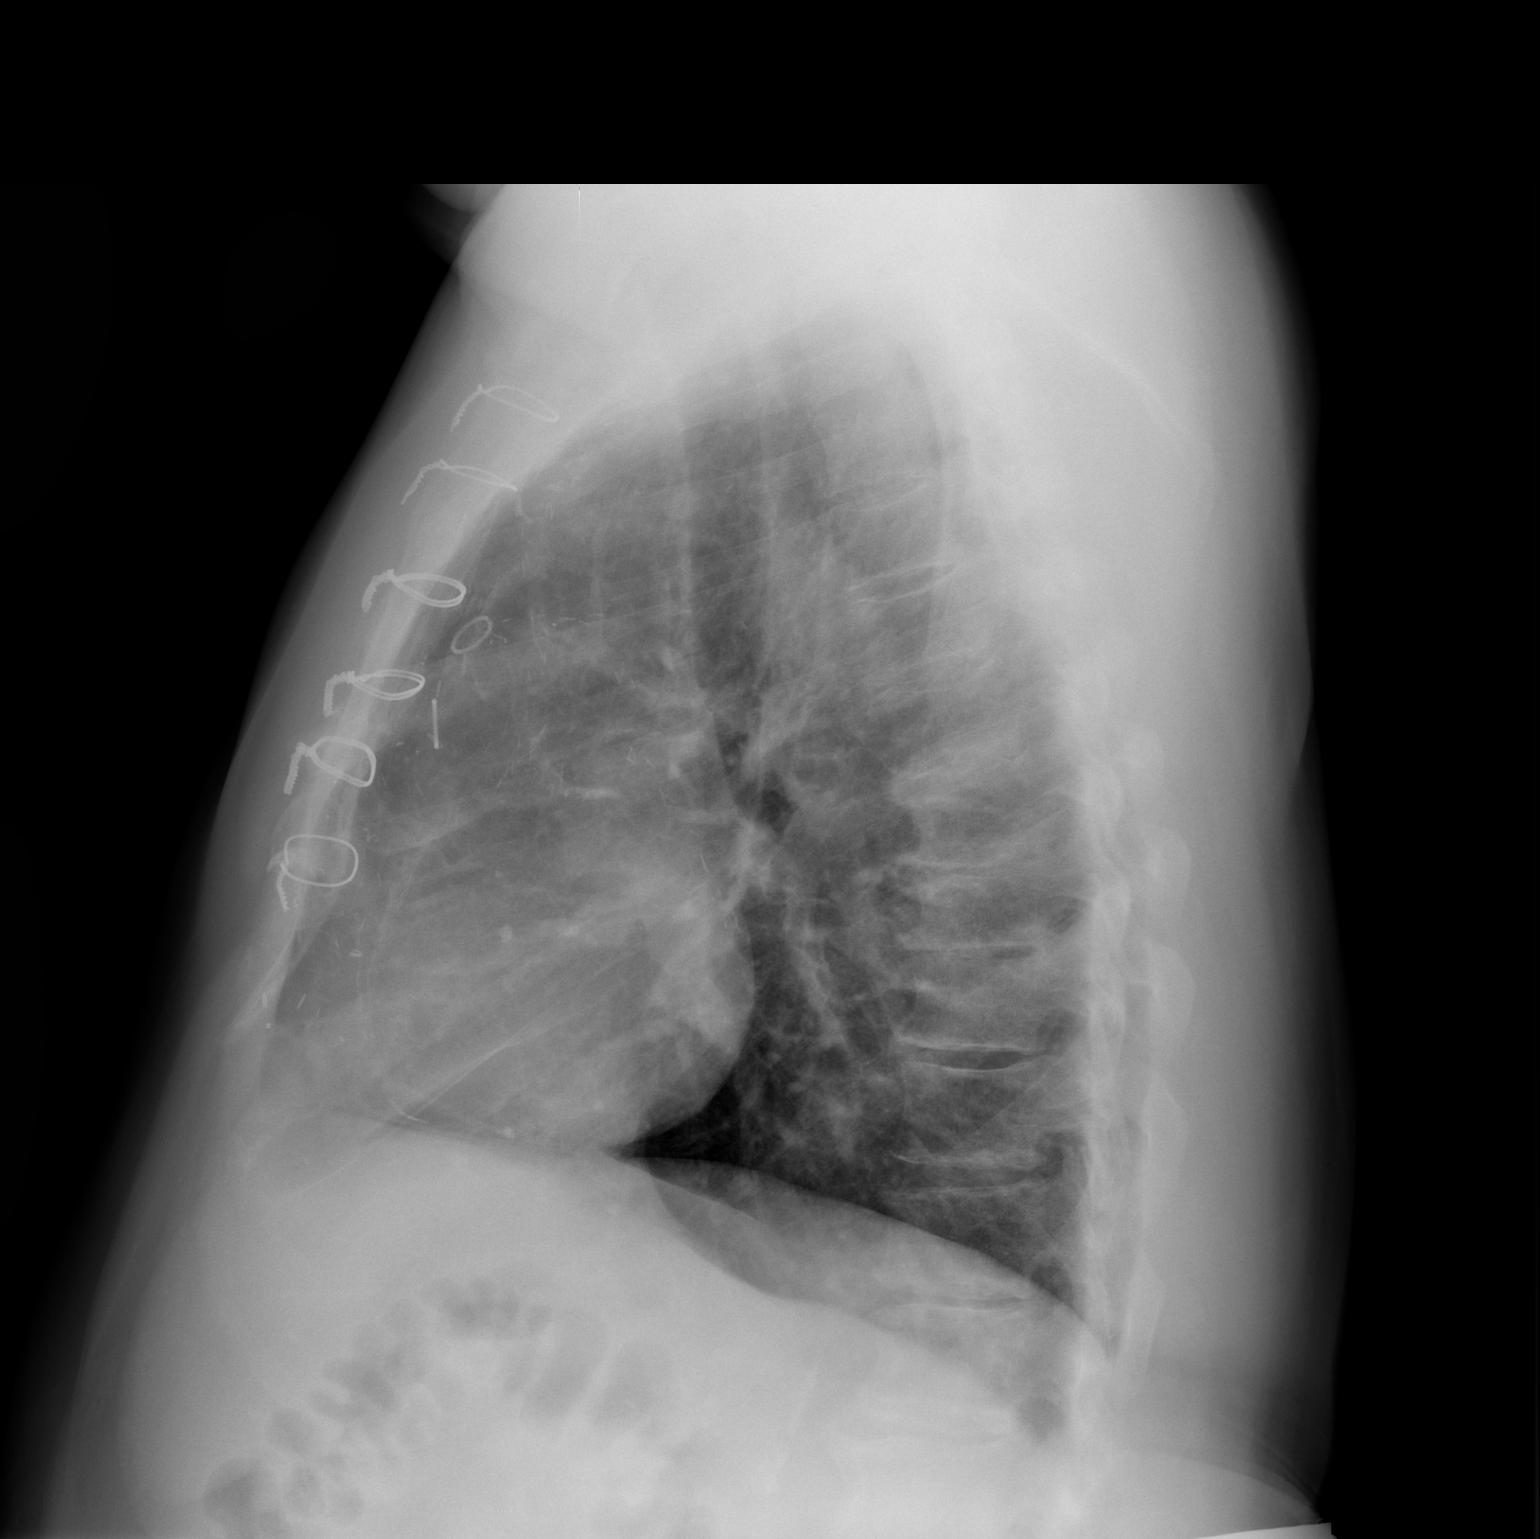

[2 of 2 positions shown; findings below may reference images not displayed]

FINDINGS: The cardiac silhouette, mediastinal and hilar contours
are normal and stable.  Stable surgical changes from bypass
surgery.  The lungs are clear.  No pleural effusion.  Stable
congenital anomaly involving the cervical and thoracic spines.
IMPRESSION: No acute cardiopulmonary findings.

## 2014-11-29 ENCOUNTER — Telehealth: Payer: Self-pay | Admitting: *Deleted

## 2014-11-29 NOTE — Telephone Encounter (Signed)
Requesting surgical clearance: [facility]  1. Type of surgery: RIGHT URETEROSCOPIC STONE EXTRACTION  2. Surgeon:   3. Surgical date: PENDING CLEARANCE  4. Medications that need to be held: CLOPIDOGREL and ASPIRIN 7 DAYS PRIOR TO PROCEDURE                         Special instructions: Due to patient's history cardiac clearance requested also.  Please fax to: [phone]

## 2014-12-01 NOTE — Telephone Encounter (Signed)
OK for surgery; prob can hold ASA/Plavix for only 5 days rather than 7 but up to surgeon

## 2014-12-02 NOTE — Telephone Encounter (Signed)
Surgical clearance letter sent to [facility] ---low risk, OK to hold ASA and Plavix 5-7 days prior and restart ASAP ----faxed to [phone].

## 2015-01-27 ENCOUNTER — Telehealth: Payer: Self-pay | Admitting: Cardiovascular Disease

## 2015-01-27 NOTE — Telephone Encounter (Signed)
Is asking if Robert Melton can get some samples of Ranexa . Please call   Thanks

## 2015-01-27 NOTE — Telephone Encounter (Signed)
Three 1000 mg  7 day packages of Ranexa to front desk for patient to pick up  Patient aware that it is waiting for him

## 2015-02-03 ENCOUNTER — Other Ambulatory Visit: Payer: Self-pay | Admitting: Cardiovascular Disease

## 2015-02-03 NOTE — Telephone Encounter (Signed)
REFILL 

## 2015-02-17 ENCOUNTER — Other Ambulatory Visit: Payer: Self-pay | Admitting: Cardiovascular Disease

## 2015-02-17 NOTE — Telephone Encounter (Signed)
Patient calling the office for samples of medication:   1.  What medication and dosage are you requesting samples for?Ranexa  2.  Are you currently out of this medication? 4 days left  3. Are you requesting samples to get you through until a mail order prescription arrives?no

## 2015-02-17 NOTE — Telephone Encounter (Signed)
Medication samples have been provided to the patient.  Drug name: ranexa 100mg   Qty: 56  LOT: [id number]  Exp.Date: 12/2017  Samples left at front desk for patient pick-up. Patient notified.  Sheral Apley M 1:51 PM 02/17/2015

## 2015-03-10 ENCOUNTER — Encounter: Payer: Self-pay | Admitting: Cardiovascular Disease

## 2015-03-10 ENCOUNTER — Ambulatory Visit (INDEPENDENT_AMBULATORY_CARE_PROVIDER_SITE_OTHER): Payer: Medicare Other | Admitting: Cardiovascular Disease

## 2015-03-10 VITALS — BP 132/76 | HR 57 | Ht 62.0 in | Wt 172.6 lb

## 2015-03-10 DIAGNOSIS — E785 Hyperlipidemia, unspecified: Secondary | ICD-10-CM | POA: Diagnosis not present

## 2015-03-10 DIAGNOSIS — Z79899 Other long term (current) drug therapy: Secondary | ICD-10-CM

## 2015-03-10 DIAGNOSIS — I25810 Atherosclerosis of coronary artery bypass graft(s) without angina pectoris: Secondary | ICD-10-CM | POA: Diagnosis not present

## 2015-03-10 DIAGNOSIS — E669 Obesity, unspecified: Secondary | ICD-10-CM

## 2015-03-10 DIAGNOSIS — I10 Essential (primary) hypertension: Secondary | ICD-10-CM

## 2015-03-10 DIAGNOSIS — Z720 Tobacco use: Secondary | ICD-10-CM

## 2015-03-10 MED ORDER — RANOLAZINE ER 1000 MG PO TB12
500.0000 mg | ORAL_TABLET | Freq: Two times a day (BID) | ORAL | Status: DC
Start: 2015-03-10 — End: 2015-10-03

## 2015-03-10 NOTE — Progress Notes (Signed)
Patient ID: Robert Robert Melton, male   DOB: 1951/04/21, 64 y.o.   MRN: [id number]    HPI: Robert Robert Melton  is a 64 y.o. male who presents to the office today for a 6 month followup cardiology evaluation.  Robert Robert Melton has known CAD and in 1993 suffered a myocardial infarction treated with PTCA at [facility]. In December 2009 he was found to have progressive CAD and underwent CABG surgery by Dr. Roxan Hockey (LIMA to the LAD, free RIMA graft to the OM1, and sequential vein graft to the PDA and PLA segment). In June 2011 repeat catheterization for recurrent chest pain showed 99% stenosis at the anastomosis of the vein graft to his right coronary artery. He also had an occluded sequential limb which had previously supplied the PLA vessel. He underwent successful PTCA of the 99% anastomosis stenosis to the PDA at that time and felt markedly improved. In June 2014, he experienced increasing episodes of recurrent anginal symptomatology. He was hospitalized with unstable angina on 11/29/2012 underwent repeat catheterization which showed low normal ejection fraction at 50%. He had significant native CAD with calcified LAD and total occlusion proximally. The was a 90% proximal circumflex stenosis with 70% stenosis in the OM1 vessel. RCA was diffusely diseased and calcified with 30-40 and 70% proximal mid acute marginal stenoses as well as a 40% PLA stenosis. He had been LIMA graft to the LAD, a patent  RIMA graft to the OM1 vessel, and a patent vein graft to the PDA without evidence for restenosis at the anastomosis site but again he had an occluded sequential limb.  During that hospitalization we had increased his medical regimen. He has noticed marked improvement in his prior symptomatology but he still has experienced some episodes of chest pain. An echo Doppler revealed ejection fraction 55-60% with mild aortic valve sclerosis without stenosis, trivial tricuspid and pulmonic regurgitation. His chest pain symptoms  significantly  improved with the addition of Ranexa initially at 500 twice a day.  Subsequently, I recommended further titration to 1000 twice a day.  He was readmitted to [facility] hospital in transfer from [facility] after development of sudden onset of severe shortness of breath on 04/28/2014.  He was noted to have mild troponin elevation due to concern for possible non-ST segment Robert Melton.  He was transferred to [facility] hospital.  Subsequent enzymes were negative.  He was treated effectively with nebulizers and his respiratory status improved.  A CT angiogram at [facility] did not show any PE.  However, there was concern for infiltrate and some small pulmonary nodules for which a follow-up CT in one year was recommended.  He underwent repeat cardiac catheterization by Dr. Tamala Julian which essentially was unchanged from his previous catheterization and showed severe native CAD with total occlusion of the LAD, high-grade obstruction in the proximal mid circumflex, high-grade, diffuse calcified disease in the proximal to distal RCA.  He had a widely patent LIMA to the LAD, a free RIMA to the obtuse marginal, and SVG to the PDA with unchanged appearance from one year previously.  Since I last saw him 6 months ago, he unfortunately, continues to smoke cigarettes.  He denies any recent anginal symptomatology on his current medical regimen  consisting of amlodipine 7.5 mg, aspirin 81 mg, Plavix 75 mg, Imdur 120 mg, lisinopril 20 mg, metoprolol tartrate 25 mg twice a day, and ranolazine 1000 g twice a day.  He  takes Protonix for GERD.  He takes Claritin 10 mg for allergies.  Over the  past several months he has noticed an intermittent cough.  He denies sputum.  Past Medical History  Diagnosis Date  . CAD (coronary artery disease)   . History of Robert Melton (myocardial infarction) 1993    PTCA at [facility] with stenting  . Hypertension   . Hyperlipidemia   . GERD (gastroesophageal reflux disease)   . Broken neck ([place]) 2008    fall from ladder   . History of echocardiogram 10/29/2009    EF 62-22%; LV systolic function normal; mildly sclerotic AV  . History of nuclear stress test 08/21/2008    dipyridamole; negative for ischemia; normal study   . History of Doppler ultrasound 04/13/2006    LEAs; bilat ABIs - no evidence of arterial insuff.; bilat PVRs - normal; irregular non-hemodynamically significant plaque bilat in SFA  . Myocardial infarction ([place]) 04/2014  . Arthritis     RA    Past Surgical History  Procedure Laterality Date  . Coronary artery bypass graft  2009    revascularization by Dr. Koleen Nimrod; LIMA to LAD, free RIMA graft to OM1, sequential vein gradt to PDA & PL segment  . Lumbar spine surgery  2004    s/p fusion L4-S1; 4 back surgeries  . Parathyroidectomy  2001  . Knee surgery      Left Knee (1992), Right Knee (1998);   Marland Kitchen Carpal tunnel release  2000  . Neck fusion  1999  . Skin cancer excision  1999    face  . Left heart catheterization with coronary/graft angiogram N/A 11/29/2012    Procedure: LEFT HEART CATHETERIZATION WITH Beatrix Fetters;  Surgeon: Troy Sine, MD;  Location: [facility] C[facility] LAB;  Service: Cardiovascular;  Laterality: N/A;  . Cardiac catheterization  10/24/2009    r/t CP; significant native CAD, patent LIMA graft to LAD & patent RIMA to obtuse marginal vessel; graft to RCA 99% stenosed at anastomses; stenting to RCA reduced to 20-30%  . Cardiac catheterization  04/30/2014  . Left heart catheterization with coronary/graft angiogram N/A 04/30/2014    Procedure: LEFT HEART CATHETERIZATION WITH Beatrix Fetters;  Surgeon: Sinclair Grooms, MD;  Location: [facility] C[facility] LAB;  Service: Cardiovascular;  Laterality: N/A;    No Known Allergies  Current Outpatient Prescriptions  Medication Sig Dispense Refill  . albuterol (PROVENTIL HFA;VENTOLIN HFA) 108 (90 BASE) MCG/ACT inhaler Inhale 2 puffs into the lungs as directed.    Marland Kitchen amLODipine (NORVASC) 5 MG tablet Take 1.5 tablets (7.5 mg total)  by mouth daily. 135 tablet 3  . aspirin EC 81 MG tablet Take 81 mg by mouth daily.    Marland Kitchen atorvastatin (LIPITOR) 40 MG tablet Take 40 mg by mouth daily.    . clopidogrel (PLAVIX) 75 MG tablet TAKE ONE TABLET BY MOUTH ONCE DAILY 30 tablet 7  . docusate sodium (COLACE) 100 MG capsule Take 100 mg by mouth daily.    Marland Kitchen HYDROcodone-acetaminophen (NORCO/VICODIN) 5-325 MG per tablet Take 1-2 tablets by mouth as needed.    . isosorbide mononitrate (IMDUR) 120 MG 24 hr tablet Take 1 tablet (120 mg total) by mouth daily. 30 tablet 11  . lisinopril (PRINIVIL,ZESTRIL) 20 MG tablet Take 20 mg by mouth daily.    Marland Kitchen loratadine (CLARITIN) 10 MG tablet Take 10 mg by mouth daily as needed for allergies.    . metoprolol tartrate (LOPRESSOR) 25 MG tablet TAKE ONE TABLET BY MOUTH TWICE DAILY 180 tablet 0  . nitroGLYCERIN (NITROSTAT) 0.4 MG SL tablet Place 0.4 mg under the tongue every 5 (five)  minutes as needed for chest pain.    . pantoprazole (PROTONIX) 40 MG tablet Take 40 mg by mouth daily.     Marland Kitchen RANEXA 1000 MG SR tablet TAKE ONE TABLET BY MOUTH TWICE DAILY 180 tablet 3  . ranolazine (RANEXA) 1000 MG SR tablet Take 1 tablet (1,000 mg total) by mouth 2 (two) times daily. 56 tablet 0   No current facility-administered medications for this visit.    Socially he is married has 4 children 10 grandchildren. There is no alcohol use.  He is resuming smoking for the past year.  ROS General: Negative; No fevers, chills, or night sweats;  HEENT: Negative; No changes in vision or hearing, sinus congestion, difficulty swallowing Pulmonary: Positive for occasional cough.  No wheezing Cardiovascular: See history of present illness; presyncope, syncope, palpitations GI: Negative; No nausea, vomiting, diarrhea, or abdominal pain GU: Negative; No dysuria, hematuria, or difficulty voiding Musculoskeletal: Positive for hip discomfort, but typically with; no myalgias, joint pain, or weakness Hematologic/Oncology: Negative; no  easy bruising, bleeding Endocrine: Negative; no heat/cold intolerance; no diabetes Neuro: Negative; no changes in balance, headaches Skin: Positive for rash, right pretibial lower extremity Psychiatric: Negative; No behavioral problems, depression Sleep: Negative; No snoring, daytime sleepiness, hypersomnolence, bruxism, restless legs, hypnogognic hallucinations, no cataplexy Other comprehensive 14 point system review is negative.  PE BP 132/76 mmHg  Pulse 57  Ht 5' 2"  (1.575 m)  Wt 172 lb 9.6 oz (78.291 kg)  BMI 31.56 kg/m2   Wt Readings from Last 3 Encounters:  03/10/15 172 lb 9.6 oz (78.291 kg)  09/05/14 175 lb 6.4 oz (79.561 kg)  05/13/14 179 lb 12.8 oz (81.557 kg)   General: Alert, oriented, no distress.  Skin: normal turgor, no rashes HEENT: Normocephalic, atraumatic. Pupils round and reactive; sclera anicteric;no lid lag.  Nose without nasal septal hypertrophy Mouth/Parynx benign; Mallinpatti scale 3 Neck: No JVD, no carotid bruits with normal carotid upstroke Lungs: clear to ausculatation and percussion; no wheezing or rales Chest wall: Nontender to palpation Heart: RRR, s1 s2 normal 1/6 systolic murmur, no diastolic murmur.  No S3 or S4 gallop.  No rubs thrills or heaves. Abdomen: soft, nontender; no hepatosplenomehaly, BS+; abdominal aorta nontender and not dilated by palpation. Pulses 2+ ; specifically, distal pulses are 2+. Back: No CVA tenderness Extremities: no clubbing cyanosis or edema, Homan's sign negative  Neurologic: grossly nonfocal Psychological: Normal affect and mood  ECG (independently read by me): Sinus bradycardia 57 bpm.  Previously noted.  Nondiagnostic ST-T changes anterolaterally.  April 2016 ECG (independently read by me): Sinus bradycardia 55 bpm.  Incomplete right bundle branch block.  Lateral T-wave changes.  December 2015 ECG (independently read by me): Normal sinus rhythm at 65 bpm.  Probable left atrial enlargement.  Previously noted ST-T  changes anterolaterally  November  2015 ECG (independently read by me): Sinus bradycardia 51 bpm.  ST-T wave changes laterally  V3 through V6.  QTc interval 409 ms.  August 2015 ECG (independently read by me): Sinus bradycardia 51 beats per minute.  Previously noted T wave abnormality V3-6.  QTc interval normal  09/13/2013 ECG (independently read by me as): Sinus rhythm at 57 beats per minute.  T wave abnormality in V4 through V6 and mildly in 1 and L. which have been present previously, but perhaps may be slightly increased in V5, V6, compared to prior tracing.  Prior 03/16/2013 ECG: Sinus bradycardia at 57 beats per minute. Nonspecific ST abnormalities.  QTc interval 455 msec.  LABS:  BMP  Latest Ref Rng 09/02/2014 04/29/2014 12/14/2013  Glucose 70 - 99 mg/dL 97 105(H) 94  BUN 6 - 23 mg/dL 14 13 15   Creatinine 0.50 - 1.35 mg/dL 0.92 0.88 0.95  Sodium 135 - 145 mEq/L 138 141 142  Potassium 3.5 - 5.3 mEq/L 4.5 4.6 4.0  Chloride 96 - 112 mEq/L 103 105 108  CO2 19 - 32 mEq/L 28 24 24   Calcium 8.4 - 10.5 mg/dL 9.4 9.7 9.0   Hepatic Function Latest Ref Rng 09/02/2014 04/29/2014 12/14/2013  Total Protein 6.0 - 8.3 g/dL 6.7 6.5 6.3  Albumin 3.5 - 5.2 g/dL 4.0 3.4(L) 4.0  AST 0 - 37 U/L 13 14 14   ALT 0 - 53 U/L 12 15 13   Alk Phosphatase 39 - 117 U/L 92 86 85  Total Bilirubin 0.2 - 1.2 mg/dL 0.5 0.5 0.6  Bilirubin, Direct 0.0 - 0.3 mg/dL - <0.2 -   CBC Latest Ref Rng 09/02/2014 04/30/2014 04/29/2014  WBC 4.0 - 10.5 K/uL 6.8 6.7 6.1  Hemoglobin 13.0 - 17.0 g/dL 14.6 13.2 13.2  Hematocrit 39.0 - 52.0 % 44.3 39.3 39.1  Platelets 150 - 400 K/uL 163 136(L) 130(L)   Lab Results  Component Value Date   MCV 96.1 09/02/2014   MCV 90.1 04/30/2014   MCV 90.3 04/29/2014   Lab Results  Component Value Date   TSH 1.828 09/02/2014   Lab Results  Component Value Date   HGBA1C  02/29/2008    5.6 (NOTE)   The ADA recommends the following therapeutic goal for glycemic   control related to Hgb A1C  measurement:   Goal of Therapy:   < 7.0% Hgb A1C   Reference: American Diabetes Association: Clinical Practice   Recommendations 2008, Diabetes Care,  2008, 31:(Suppl 1).   Lipid Panel     Component Value Date/Time   CHOL 136 09/02/2014 0803   TRIG 243* 09/02/2014 0803   HDL 28* 09/02/2014 0803   CHOLHDL 4.9 09/02/2014 0803   VLDL 49* 09/02/2014 0803   LDLCALC 59 09/02/2014 0803     RADIOLOGY: Dg Chest 2 View  11/27/2012   *RADIOLOGY REPORT*  Clinical Data: Chest pain and shortness of breath.  Preop cardiac cath.  CHEST - 2 VIEW  Comparison: 10/21/2009.  Findings: The cardiac silhouette, mediastinal and hilar contours are normal and stable.  Stable surgical changes from bypass surgery.  The lungs are clear.  No pleural effusion.  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  IMPRESSION: No acute cardiopulmonary findings.   Original Report Authenticated By: Marijo Sanes, M.D.      ASSESSMENT AND PLAN: Mr. Heckmann is a 64 year old Caucasian male who has established CAD and suffered initial myocardial infarction 23 years ago in 1993.  He has documented patent grafts with previously demonstrated old sequential limb occlusion of the graft from the PDA to the PLA vessel. He has potential sources of native vessel ischemia not supplied well by the grafts.  He has noticed significant improvement in his symptoms with the addition of Ranexa and, now he's been taking the maximum dose at 1000 mg twice a day.  He has felt well without recurrent anginal symptoms on the increased dose of amlodipine at 7.5 mg.  His resting pulse is bradycardic at 57 and 1 last seen his dose of metoprolol was slightly reduced. I again spent a long time discussing the importance of smoking cessation.  He is now taking over-the-counter fish oil 2 capsules  in light of his elevated triglycerides  at 243, and increased VLDL at 49.  He has lost 7 pounds since his last office visit.  I commended him on his  weight loss.  He continues to experience increasing recurrent cough, he should undergo a PA and lateral chest x-ray in light of his 50 year tobacco history.  I will see him in 6 months for cardiology reevaluation or sooner, follows arise.  Time spent: 32mnutes   TTroy Sine MD, [facility] 03/10/2015 6:41 PM

## 2015-03-10 NOTE — Patient Instructions (Signed)
Your physician wants you to follow-up in: 6 months or sooner if needed. You will receive a reminder letter in the mail two months in advance. If you don't receive a letter, please call our office to schedule the follow-up appointment.  Your physician recommends that you return for lab work prior to your next visit. A letter will be sent to you notifying you when it's time.  If you need a refill on your cardiac medications before your next appointment, please call your pharmacy.

## 2015-05-05 ENCOUNTER — Other Ambulatory Visit: Payer: Self-pay | Admitting: Cardiovascular Disease

## 2015-05-06 NOTE — Telephone Encounter (Signed)
Rx(s) sent to pharmacy electronically.  

## 2015-05-20 ENCOUNTER — Telehealth: Payer: Self-pay

## 2015-05-20 ENCOUNTER — Other Ambulatory Visit: Payer: Self-pay | Admitting: Cardiovascular Disease

## 2015-05-20 ENCOUNTER — Telehealth: Payer: Self-pay | Admitting: Cardiovascular Disease

## 2015-05-20 NOTE — Telephone Encounter (Signed)
Spoke with wife, will have patient assistance paperwork left at desk at [facility] [address] when she picks up samples tomorrow.  She will complete and return to me at the [facility] office

## 2015-05-20 NOTE — Telephone Encounter (Signed)
Returned call to patient's wife.She stated husband needs Ranexa 1000mg .Stated Ranexa too expensive.Stated a prescription was $900.Advised our [employer] office will leave samples at front desk.Message sent to our pharmacist Erasmo Downer for help with patient assistance.

## 2015-05-20 NOTE — Telephone Encounter (Signed)
Ranexa 1000mg  3 packs provided to patient per request.

## 2015-05-20 NOTE — Telephone Encounter (Signed)
Rx request sent to pharmacy.  

## 2015-05-20 NOTE — Telephone Encounter (Signed)
Patient calling the office for samples of medication: ° ° °1.  What medication and dosage are you requesting samples for? Ranexa ° °2.  Are you currently out of this medication?yes ° ° °

## 2015-05-28 ENCOUNTER — Other Ambulatory Visit: Payer: Self-pay | Admitting: Cardiovascular Disease

## 2015-05-28 NOTE — Telephone Encounter (Signed)
Rx request sent to pharmacy.  

## 2015-06-09 ENCOUNTER — Telehealth: Payer: Self-pay | Admitting: Cardiovascular Disease

## 2015-06-09 NOTE — Telephone Encounter (Signed)
Spoke with pt wife, aware no samples available today. She will call back later in the week.

## 2015-06-09 NOTE — Telephone Encounter (Signed)
Patient calling the office for samples of medication:   1.  What medication and dosage are you requesting samples for?Ranexa 1000mg  twice a day   2.  Are you currently out of this medication? Yes

## 2015-06-13 ENCOUNTER — Telehealth: Payer: Self-pay | Admitting: Cardiovascular Disease

## 2015-06-13 NOTE — Telephone Encounter (Signed)
Routed to Max to advise on financial options.

## 2015-06-13 NOTE — Telephone Encounter (Signed)
LMOM that don't know of any other meds to try.  Pt already on amlodipine 7.5 mg, isosorbide mono 120 mg.  Will review with Dr. Claiborne Billings

## 2015-06-13 NOTE — Telephone Encounter (Signed)
Pt's wife called in stating that they can no longer afford his Renexa and she wanted to know if there is another medication that he can take. She says that for a 3 month supply in now costing $980. Please f/u with her to advise her on what to do  Thanks

## 2015-06-19 NOTE — Telephone Encounter (Signed)
He is already on amlodipine, BB and nitrates. No replacement for ranexa. If he cannot take it due to cost, increase amlodipine to 10 mg. He may have recurrent symptoms with discontinuance. Make certain that he is not still smoking since insurance is not paying for his cigarettes.

## 2015-06-19 NOTE — Telephone Encounter (Signed)
Robert Melton  Have patient increase amlodipine to 10mg  daily.  There is no other medications to replace Ranexa.

## 2015-06-20 ENCOUNTER — Telehealth: Payer: Self-pay | Admitting: Cardiovascular Disease

## 2015-06-20 MED ORDER — AMLODIPINE BESYLATE 10 MG PO TABS: 5.0000 mg | ORAL_TABLET | Freq: Every day | ORAL | Status: DC

## 2015-06-20 NOTE — Telephone Encounter (Signed)
Returned call to wife. After discussion, wife aware of recommendations. Rx refilled at new dose. Instructed to call if new problems.

## 2015-06-20 NOTE — Telephone Encounter (Signed)
Phone rings to VM. Left message to call.

## 2015-06-20 NOTE — Telephone Encounter (Signed)
Robert Melton this is a triage question that you sent to [facility]

## 2015-06-20 NOTE — Telephone Encounter (Signed)
Follow Up ° °Pt wife returned the call  °

## 2015-08-27 ENCOUNTER — Other Ambulatory Visit: Payer: Self-pay | Admitting: *Deleted

## 2015-08-27 ENCOUNTER — Encounter: Payer: Self-pay | Admitting: *Deleted

## 2015-08-27 DIAGNOSIS — E785 Hyperlipidemia, unspecified: Secondary | ICD-10-CM

## 2015-08-27 DIAGNOSIS — I25810 Atherosclerosis of coronary artery bypass graft(s) without angina pectoris: Secondary | ICD-10-CM

## 2015-08-27 DIAGNOSIS — Z79899 Other long term (current) drug therapy: Secondary | ICD-10-CM

## 2015-08-27 DIAGNOSIS — I10 Essential (primary) hypertension: Secondary | ICD-10-CM

## 2015-09-08 ENCOUNTER — Ambulatory Visit: Payer: Medicare Other | Admitting: Cardiovascular Disease

## 2015-10-03 ENCOUNTER — Encounter: Payer: Self-pay | Admitting: Cardiovascular Disease

## 2015-10-03 ENCOUNTER — Ambulatory Visit (INDEPENDENT_AMBULATORY_CARE_PROVIDER_SITE_OTHER): Payer: Medicare Other | Admitting: Cardiovascular Disease

## 2015-10-03 VITALS — BP 147/77 | HR 61 | Ht 62.0 in | Wt 183.0 lb

## 2015-10-03 DIAGNOSIS — I252 Old myocardial infarction: Secondary | ICD-10-CM | POA: Diagnosis not present

## 2015-10-03 DIAGNOSIS — I2510 Atherosclerotic heart disease of native coronary artery without angina pectoris: Secondary | ICD-10-CM | POA: Diagnosis not present

## 2015-10-03 DIAGNOSIS — I25810 Atherosclerosis of coronary artery bypass graft(s) without angina pectoris: Secondary | ICD-10-CM

## 2015-10-03 DIAGNOSIS — Z720 Tobacco use: Secondary | ICD-10-CM | POA: Diagnosis not present

## 2015-10-03 DIAGNOSIS — E785 Hyperlipidemia, unspecified: Secondary | ICD-10-CM

## 2015-10-03 DIAGNOSIS — I10 Essential (primary) hypertension: Secondary | ICD-10-CM

## 2015-10-03 DIAGNOSIS — K219 Gastro-esophageal reflux disease without esophagitis: Secondary | ICD-10-CM

## 2015-10-03 NOTE — Progress Notes (Signed)
Patient ID: Robert Melton, male   DOB: 1951/04/30, 65 y.o.   MRN: [id number]    HPI: Robert Melton  is a 65 y.o. male who presents to the office today for a 7 month followup cardiology evaluation.  Robert Melton has known CAD and in 1993 suffered a myocardial infarction treated with PTCA at [facility]. In December 2009 he was found to have progressive CAD and underwent CABG surgery by Dr. Roxan Hockey (LIMA to the LAD, free RIMA graft to the OM1, and sequential vein graft to the PDA and PLA segment). In June 2011 repeat catheterization for recurrent chest pain showed 99% stenosis at the anastomosis of the vein graft to his right coronary artery. He also had an occluded sequential limb which had previously supplied the PLA vessel. He underwent successful PTCA of the 99% anastomosis stenosis to the PDA at that time and felt markedly improved. In June 2014, he experienced increasing episodes of recurrent anginal symptomatology. He was hospitalized with unstable angina on 11/29/2012 underwent repeat catheterization which showed low normal ejection fraction at 50%. He had significant native CAD with calcified LAD and total occlusion proximally. The was a 90% proximal circumflex stenosis with 70% stenosis in the OM1 vessel. RCA was diffusely diseased and calcified with 30-40 and 70% proximal mid acute marginal stenoses as well as a 40% PLA stenosis. He had been LIMA graft to the LAD, a patent  RIMA graft to the OM1 vessel, and a patent vein graft to the PDA without evidence for restenosis at the anastomosis site but again he had an occluded sequential limb.  During that hospitalization we had increased his medical regimen. He has noticed marked improvement in his prior symptomatology but he still has experienced some episodes of chest pain. An echo Doppler revealed ejection fraction 55-60% with mild aortic valve sclerosis without stenosis, trivial tricuspid and pulmonic regurgitation. His chest pain symptoms  significantly  improved with the addition of Ranexa initially at 500 twice a day.  Subsequently, I recommended further titration to 1000 twice a day.  He was readmitted to [facility] hospital in transfer from [facility] after development of sudden onset of severe shortness of breath on 04/28/2014.  He was noted to have mild troponin elevation due to concern for possible non-ST segment MI.  He was transferred to [facility] hospital.  Subsequent enzymes were negative.  He was treated effectively with nebulizers and his respiratory status improved.  A CT angiogram at [facility] did not show any PE.  However, there was concern for infiltrate and some small pulmonary nodules for which a follow-up CT in one year was recommended.  He underwent repeat cardiac catheterization by Dr. Tamala Julian which essentially was unchanged from his previous catheterization and showed severe native CAD with total occlusion of the LAD, high-grade obstruction in the proximal mid circumflex, high-grade, diffuse calcified disease in the proximal to distal RCA.  He had a widely patent LIMA to the LAD, a free RIMA to the obtuse marginal, and SVG to the PDA with unchanged appearance from one year previously.  Since I last saw him, he states that his exercise has been limited due to back and hip discomfort.  Unfortunately he is still smoking one half pack per day.  Due to cost issues, he discontinued Ranexa and as result, we further increased his amlodipine to 10 mg daily.  He also has been maintained on metoprolol, tartrate 25 mg twice a day, lisinopril 20 mg daily in addition to isosorbide mononitrate 120 mg.  He denies any anginal type chest pain and is unaware of any change since discontinuing Ranexa and increasing amlodipine.  He has been taking pantoprazole for GERD.  He continues to take atorvastatin for hyperlipidemia.  He presents for evaluation.  Past Medical History  Diagnosis Date  . CAD (coronary artery disease)   . History of MI (myocardial  infarction) 1993    PTCA at [facility] with stenting  . Hypertension   . Hyperlipidemia   . GERD (gastroesophageal reflux disease)   . Broken neck ([place]) 2008    fall from ladder  . History of echocardiogram 10/29/2009    EF 62-22%; LV systolic function normal; mildly sclerotic AV  . History of nuclear stress test 08/21/2008    dipyridamole; negative for ischemia; normal study   . History of Doppler ultrasound 04/13/2006    LEAs; bilat ABIs - no evidence of arterial insuff.; bilat PVRs - normal; irregular non-hemodynamically significant plaque bilat in SFA  . Myocardial infarction ([place]) 04/2014  . Arthritis     RA    Past Surgical History  Procedure Laterality Date  . Coronary artery bypass graft  2009    revascularization by Dr. Koleen Nimrod; LIMA to LAD, free RIMA graft to OM1, sequential vein gradt to PDA & PL segment  . Lumbar spine surgery  2004    s/p fusion L4-S1; 4 back surgeries  . Parathyroidectomy  2001  . Knee surgery      Left Knee (1992), Right Knee (1998);   Marland Kitchen Carpal tunnel release  2000  . Neck fusion  1999  . Skin cancer excision  1999    face  . Left heart catheterization with coronary/graft angiogram N/A 11/29/2012    Procedure: LEFT HEART CATHETERIZATION WITH Beatrix Fetters;  Surgeon: Troy Sine, MD;  Location: [facility] CATH LAB;  Service: Cardiovascular;  Laterality: N/A;  . Cardiac catheterization  10/24/2009    r/t CP; significant native CAD, patent LIMA graft to LAD & patent RIMA to obtuse marginal vessel; graft to RCA 99% stenosed at anastomses; stenting to RCA reduced to 20-30%  . Cardiac catheterization  04/30/2014  . Left heart catheterization with coronary/graft angiogram N/A 04/30/2014    Procedure: LEFT HEART CATHETERIZATION WITH Beatrix Fetters;  Surgeon: Sinclair Grooms, MD;  Location: [facility] CATH LAB;  Service: Cardiovascular;  Laterality: N/A;    No Known Allergies  Current Outpatient Prescriptions  Medication Sig Dispense Refill  .  albuterol (PROVENTIL HFA;VENTOLIN HFA) 108 (90 BASE) MCG/ACT inhaler Inhale 2 puffs into the lungs as directed.    Marland Kitchen amLODipine (NORVASC) 10 MG tablet Take 0.5 tablets (5 mg total) by mouth daily. 90 tablet 1  . aspirin EC 81 MG tablet Take 81 mg by mouth daily.    Marland Kitchen atorvastatin (LIPITOR) 40 MG tablet Take 40 mg by mouth daily.    . clopidogrel (PLAVIX) 75 MG tablet TAKE ONE TABLET BY MOUTH ONCE DAILY 30 tablet 6  . docusate sodium (COLACE) 100 MG capsule Take 100 mg by mouth daily.    Marland Kitchen HYDROcodone-acetaminophen (NORCO/VICODIN) 5-325 MG per tablet Take 1-2 tablets by mouth as needed.    . isosorbide mononitrate (IMDUR) 120 MG 24 hr tablet Take 1 tablet (120 mg total) by mouth daily. 30 tablet 11  . lisinopril (PRINIVIL,ZESTRIL) 20 MG tablet Take 20 mg by mouth daily.    Marland Kitchen loratadine (CLARITIN) 10 MG tablet Take 10 mg by mouth daily as needed for allergies.    . metoprolol tartrate (LOPRESSOR) 25 MG tablet  TAKE ONE TABLET BY MOUTH TWICE DAILY 180 tablet 1  . nitroGLYCERIN (NITROSTAT) 0.4 MG SL tablet Place 0.4 mg under the tongue every 5 (five) minutes as needed for chest pain.    . pantoprazole (PROTONIX) 40 MG tablet Take 40 mg by mouth daily.     Marland Kitchen RANEXA 1000 MG SR tablet TAKE ONE TABLET BY MOUTH TWICE DAILY 180 tablet 3   No current facility-administered medications for this visit.    Socially he is married has 4 children 10 grandchildren. There is no alcohol use.  He is resuming smoking for the past year.  ROS General: Negative; No fevers, chills, or night sweats;  HEENT: Negative; No changes in vision or hearing, sinus congestion, difficulty swallowing Pulmonary: Positive for occasional cough.  No wheezing Cardiovascular: See history of present illness; presyncope, syncope, palpitations GI: Negative; No nausea, vomiting, diarrhea, or abdominal pain GU: Negative; No dysuria, hematuria, or difficulty voiding Musculoskeletal: Positive for hip discomfort, but typically with; no  myalgias, joint pain, or weakness Hematologic/Oncology: Negative; no easy bruising, bleeding Endocrine: Negative; no heat/cold intolerance; no diabetes Neuro: Negative; no changes in balance, headaches Skin: Positive for rash, right pretibial lower extremity Psychiatric: Negative; No behavioral problems, depression Sleep: Negative; No snoring, daytime sleepiness, hypersomnolence, bruxism, restless legs, hypnogognic hallucinations, no cataplexy Other comprehensive 14 point system review is negative.  PE BP 147/77 mmHg  Pulse 61  Ht 5' 2"  (1.575 m)  Wt 183 lb (83.008 kg)  BMI 33.46 kg/m2   Repeat blood pressure by me 136/78  Wt Readings from Last 3 Encounters:  10/03/15 183 lb (83.008 kg)  03/10/15 172 lb 9.6 oz (78.291 kg)  09/05/14 175 lb 6.4 oz (79.561 kg)   General: Alert, oriented, no distress.  Skin: normal turgor, no rashes HEENT: Normocephalic, atraumatic. Pupils round and reactive; sclera anicteric;no lid lag.  Nose without nasal septal hypertrophy Mouth/Parynx benign; Mallinpatti scale 3 Neck: No JVD, no carotid bruits with normal carotid upstroke Lungs: clear to ausculatation and percussion; no wheezing or rales Chest wall: Nontender to palpation Heart: RRR, s1 s2 normal 1/6 systolic murmur, no diastolic murmur.  No S3 or S4 gallop.  No rubs thrills or heaves. Abdomen: soft, nontender; no hepatosplenomehaly, BS+; abdominal aorta nontender and not dilated by palpation. Pulses 2+ ; specifically, distal pulses are 2+. Back: No CVA tenderness Extremities: no clubbing cyanosis or edema, Homan's sign negative  Neurologic: grossly nonfocal Psychological: Normal affect and mood  ECG (independently read by me): Normal sinus rhythm at 61 bpm.  Nonspecific ST changes.  Normal intervals.  October 2016 ECG (independently read by me): Sinus bradycardia 57 bpm.  Previously noted.  Nondiagnostic ST-T changes anterolaterally.  April 2016 ECG (independently read by me): Sinus  bradycardia 55 bpm.  Incomplete right bundle branch block.  Lateral T-wave changes.  December 2015 ECG (independently read by me): Normal sinus rhythm at 65 bpm.  Probable left atrial enlargement.  Previously noted ST-T changes anterolaterally  November  2015 ECG (independently read by me): Sinus bradycardia 51 bpm.  ST-T wave changes laterally  V3 through V6.  QTc interval 409 ms.  August 2015 ECG (independently read by me): Sinus bradycardia 51 beats per minute.  Previously noted T wave abnormality V3-6.  QTc interval normal  09/13/2013 ECG (independently read by me as): Sinus rhythm at 57 beats per minute.  T wave abnormality in V4 through V6 and mildly in 1 and L. which have been present previously, but perhaps may be slightly increased in V5,  V6, compared to prior tracing.  Prior 03/16/2013 ECG: Sinus bradycardia at 57 beats per minute. Nonspecific ST abnormalities.  QTc interval 455 msec.  LABS:  BMP Latest Ref Rng 09/02/2014 04/29/2014 12/14/2013  Glucose 70 - 99 mg/dL 97 105(H) 94  BUN 6 - 23 mg/dL 14 13 15   Creatinine 0.50 - 1.35 mg/dL 0.92 0.88 0.95  Sodium 135 - 145 mEq/L 138 141 142  Potassium 3.5 - 5.3 mEq/L 4.5 4.6 4.0  Chloride 96 - 112 mEq/L 103 105 108  CO2 19 - 32 mEq/L 28 24 24   Calcium 8.4 - 10.5 mg/dL 9.4 9.7 9.0   Hepatic Function Latest Ref Rng 09/02/2014 04/29/2014 12/14/2013  Total Protein 6.0 - 8.3 g/dL 6.7 6.5 6.3  Albumin 3.5 - 5.2 g/dL 4.0 3.4(L) 4.0  AST 0 - 37 U/L 13 14 14   ALT 0 - 53 U/L 12 15 13   Alk Phosphatase 39 - 117 U/L 92 86 85  Total Bilirubin 0.2 - 1.2 mg/dL 0.5 0.5 0.6  Bilirubin, Direct 0.0 - 0.3 mg/dL - <0.2 -   CBC Latest Ref Rng 09/02/2014 04/30/2014 04/29/2014  WBC 4.0 - 10.5 K/uL 6.8 6.7 6.1  Hemoglobin 13.0 - 17.0 g/dL 14.6 13.2 13.2  Hematocrit 39.0 - 52.0 % 44.3 39.3 39.1  Platelets 150 - 400 K/uL 163 136(L) 130(L)   Lab Results  Component Value Date   MCV 96.1 09/02/2014   MCV 90.1 04/30/2014   MCV 90.3 04/29/2014   Lab  Results  Component Value Date   TSH 1.828 09/02/2014   Lab Results  Component Value Date   HGBA1C  02/29/2008    5.6 (NOTE)   The ADA recommends the following therapeutic goal for glycemic   control related to Hgb A1C measurement:   Goal of Therapy:   < 7.0% Hgb A1C   Reference: American Diabetes Association: Clinical Practice   Recommendations 2008, Diabetes Care,  2008, 31:(Suppl 1).   Lipid Panel     Component Value Date/Time   CHOL 136 09/02/2014 0803   TRIG 243* 09/02/2014 0803   HDL 28* 09/02/2014 0803   CHOLHDL 4.9 09/02/2014 0803   VLDL 49* 09/02/2014 0803   LDLCALC 59 09/02/2014 0803     RADIOLOGY: Dg Chest 2 View  11/27/2012   *RADIOLOGY REPORT*  Clinical Data: Chest pain and shortness of breath.  Preop cardiac cath.  CHEST - 2 VIEW  Comparison: 10/21/2009.  Findings: The cardiac silhouette, mediastinal and hilar contours are normal and stable.  Stable surgical changes from bypass surgery.  The lungs are clear.  No pleural effusion.  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  IMPRESSION: No acute cardiopulmonary findings.   Original Report Authenticated By: Marijo Sanes, M.D.      ASSESSMENT AND PLAN: Robert Melton is a 65 year old Caucasian male who has established CAD and suffered initial myocardial infarction in 1993.  He has documented patent grafts with previously demonstrated old sequential limb occlusion of the graft from the PDA to the PLA vessel. He has potential sources of native vessel ischemia not supplied well by the grafts.  He had noticed significant improvement in his symptoms with the addition of Ranexa which had been titrated to 1000 mg twice a day.  Due to significant cost issues, he called the office and ultimately discontinued this therapy and in its place amlodipine was further titrated to 10 mg.  With the change in medical regimen, he denies any increase in chest pain symptomatology.  His blood pressure  today is controlled on  his current medical regimen consisting of metoprolol tartrate 25 twice a day, amlodipine 10 mg, Cipro 20 mrem daily in addition to his isosorbide mononitrate.  We discussed the importance of complete smoking cessation.  He continues to take pantoprazole for GERD, which is controlled.  He is mildly obese with a BMI of 33.46.  Weight reduction was recommended.  His exercise has been limited by his hip and back discomfort.  I will see him in 6 months for cardiology evaluation.   Time spent: 42mnutes   TTroy Sine MD, F[facility] 10/03/2015 6:10 PM

## 2015-10-03 NOTE — Patient Instructions (Signed)
Your physician wants you to follow-up in: 6 months or sooner if needed. You will receive a reminder letter in the mail two months in advance. If you don't receive a letter, please call our office to schedule the follow-up appointment.   If you need a refill on your cardiac medications before your next appointment, please call your pharmacy. 

## 2015-10-06 ENCOUNTER — Other Ambulatory Visit: Payer: Self-pay | Admitting: Cardiovascular Disease

## 2015-10-07 NOTE — Telephone Encounter (Signed)
Rx(s) sent to pharmacy electronically.  

## 2015-10-20 ENCOUNTER — Encounter: Payer: Self-pay | Admitting: Cardiovascular Disease

## 2015-10-21 MED ORDER — AMLODIPINE BESYLATE 10 MG PO TABS: 10.0000 mg | ORAL_TABLET | Freq: Every day | ORAL | Status: DC

## 2015-11-10 ENCOUNTER — Other Ambulatory Visit: Payer: Self-pay | Admitting: Cardiovascular Disease

## 2015-11-10 NOTE — Telephone Encounter (Signed)
Rx(s) sent to pharmacy electronically.  

## 2015-12-30 ENCOUNTER — Other Ambulatory Visit: Payer: Self-pay | Admitting: Cardiovascular Disease

## 2016-03-26 IMAGING — DX DG CHEST 2V
2 series · 2 of 2 positions shown · non-contrast
Comparison: November 27, 2012

CLINICAL DATA: Chest pain with myocardial infarction

EXAM:
CHEST  2 VIEW

[chest pa]
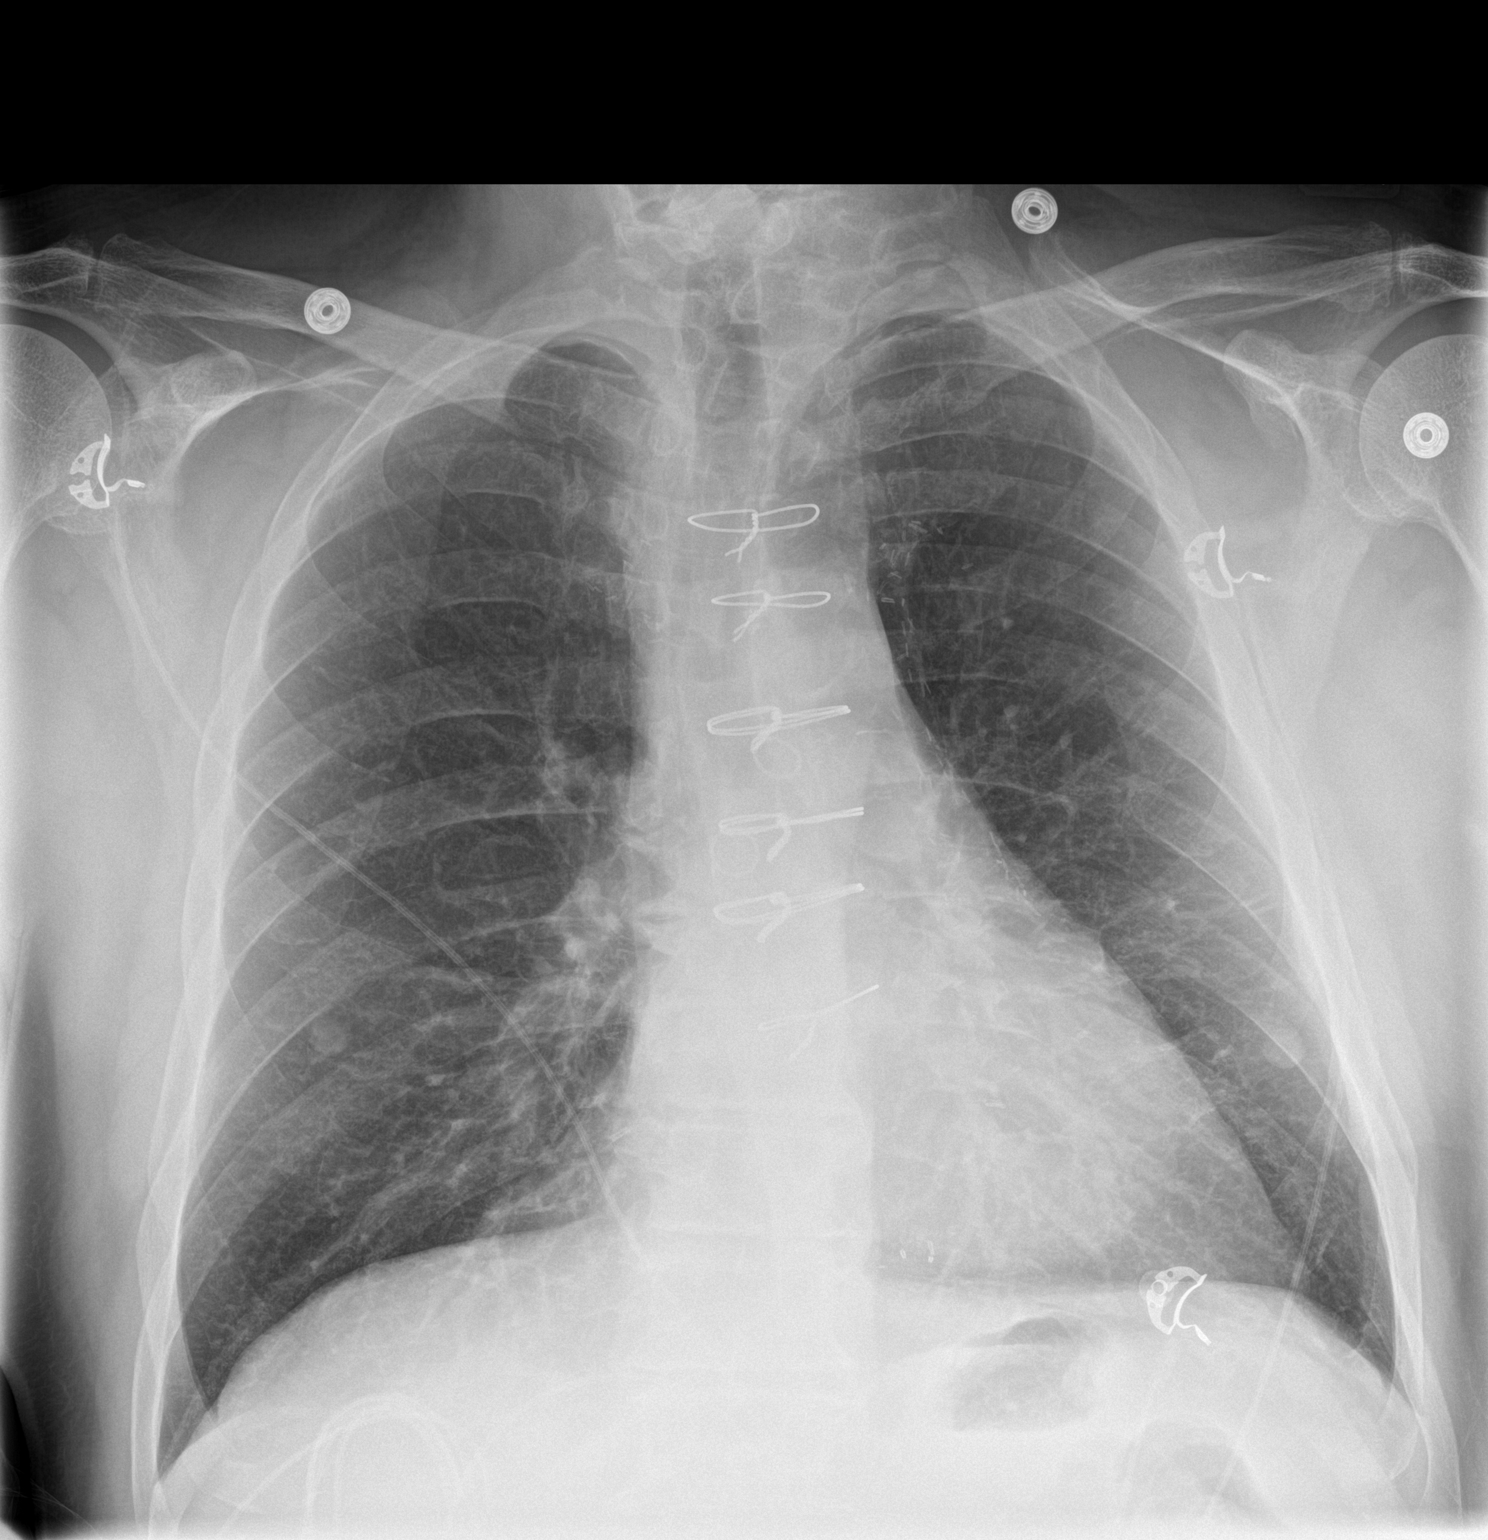

[chest lat]
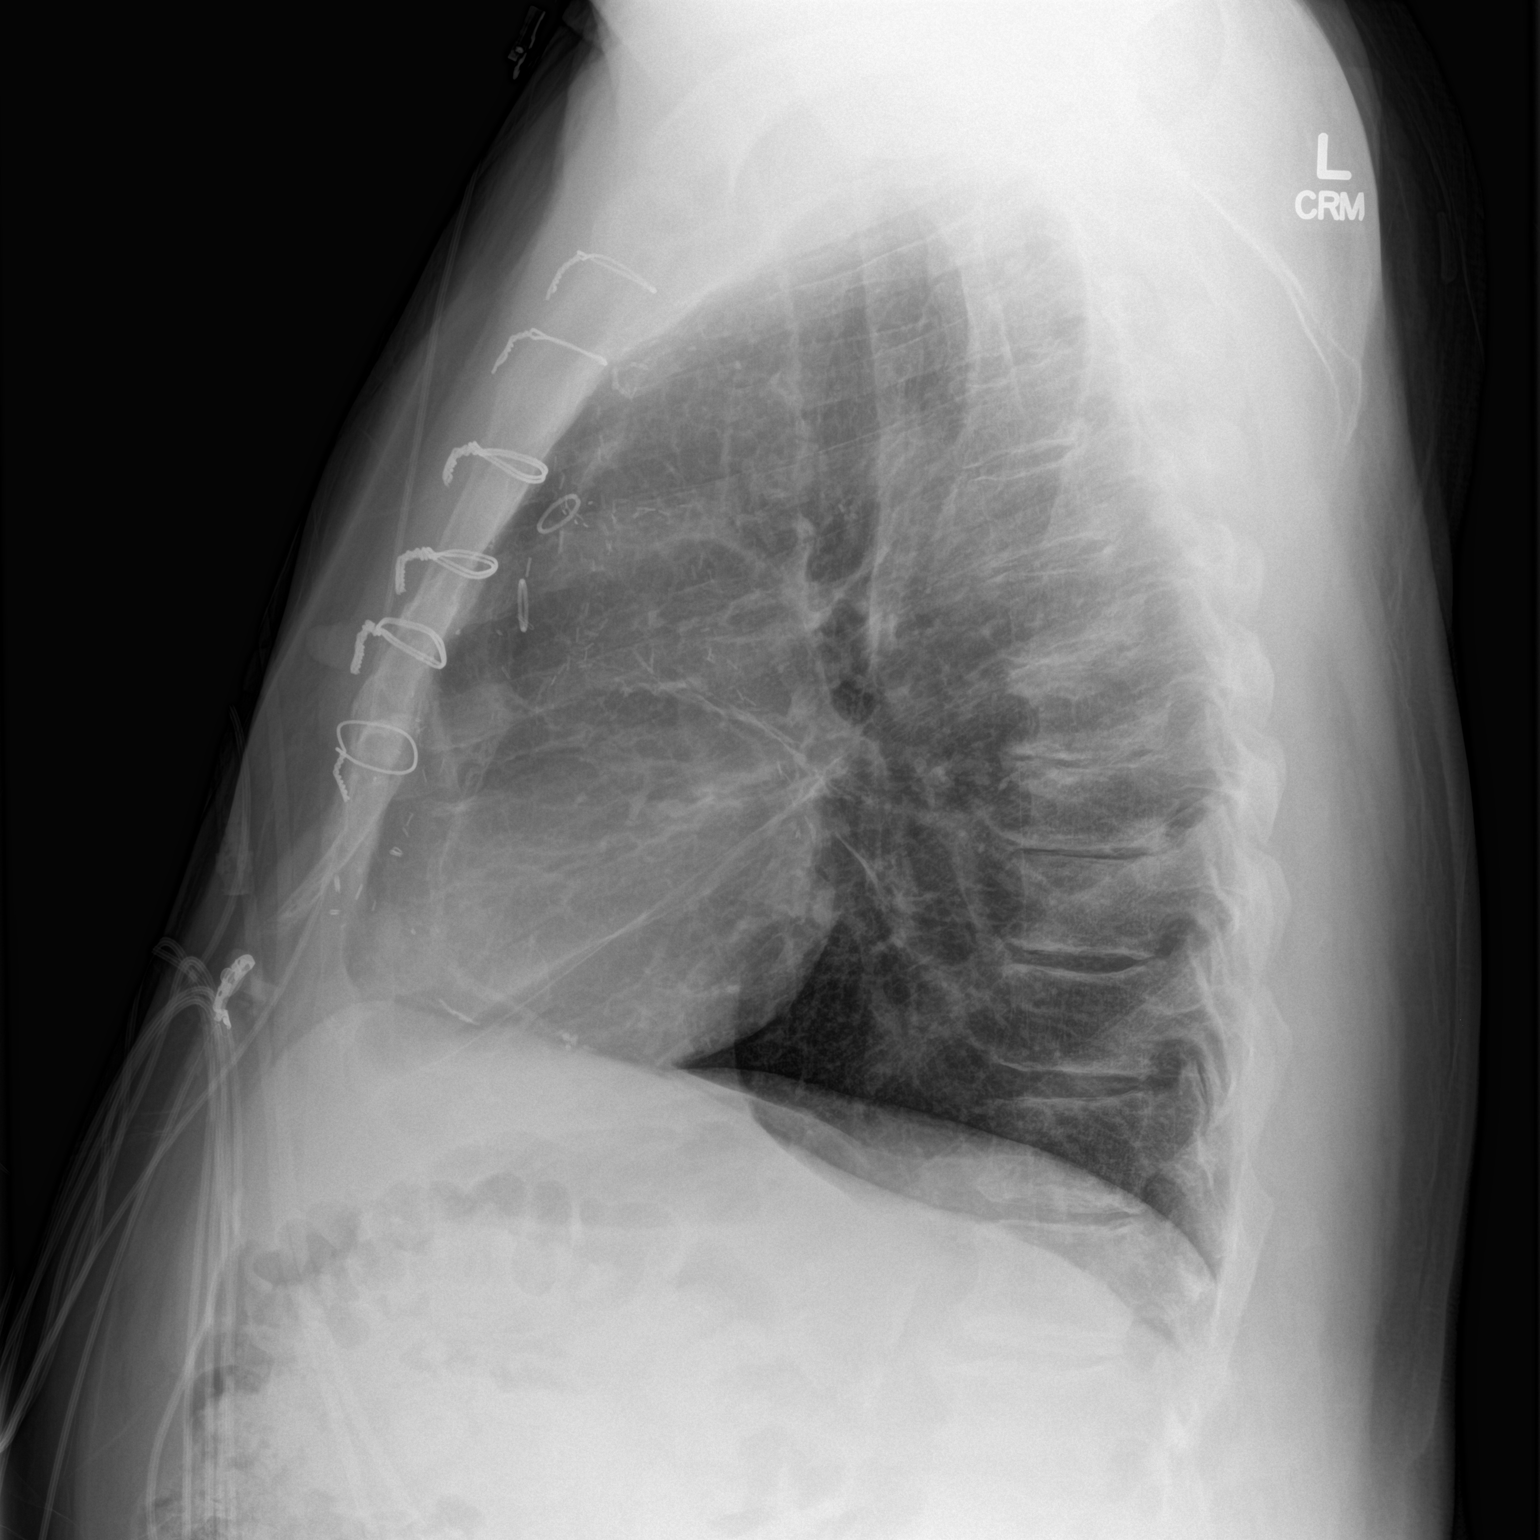

[2 of 2 positions shown; findings below may reference images not displayed]

FINDINGS: There are apparent nipple shadows bilaterally. There is no
appreciable edema or consolidation. There is underlying emphysema.
There is mild scarring in the left mid lung. The heart size is
normal. Pulmonary vascularity reflects underlying emphysema. Patient
is status post coronary artery bypass grafting. No adenopathy. There
is degenerative change in the thoracic spine.
IMPRESSION: Underlying emphysematous change. No edema or consolidation. There is
mild scarring in the left mid lung. Probable nipple shadows
bilaterally ; repeat film with nipple markers advised to confirm
this observation.

## 2016-09-24 ENCOUNTER — Other Ambulatory Visit: Payer: Self-pay | Admitting: Cardiovascular Disease

## 2016-09-24 NOTE — Telephone Encounter (Signed)
REFILL 

## 2016-09-27 ENCOUNTER — Other Ambulatory Visit: Payer: Self-pay | Admitting: Cardiovascular Disease

## 2016-11-14 ENCOUNTER — Other Ambulatory Visit: Payer: Self-pay | Admitting: Cardiovascular Disease

## 2016-12-17 ENCOUNTER — Other Ambulatory Visit: Payer: Self-pay | Admitting: Cardiovascular Disease

## 2017-01-02 NOTE — Progress Notes (Signed)
Cardiology Office Note    Date:  01/03/2017   ID:  Robert Melton, DOB 07-29-1950, MRN [id number]  PCP:  Coy Saunas, MD  Cardiologist: Dr. Claiborne Billings   Chief Complaint  Patient presents with  . Follow-up    Denies any recent symptoms.     History of Present Illness:    Robert Melton is a 66 y.o. male with past medical history of CAD (s/p CABG in 2009 with LIMA-LAD, free RIMA-OM1, and seq SVG-PDA-PLA, s/p PTCA of 99% stenosis along PDA in 2011, cath in 2015 showing severe native CAD with patent grafts except known occlusion of sequential limb of the SVG-PLA), HTN, HLD, and RA who presents to the office today for 38-month follow-up.   He was last examined by Dr. Claiborne Billings in 09/2015 and reported limited physical activity due to back and hip pain. He denied any chest pain or dyspnea on exertion at that time.   In talking with the patient today, he reports overall doing well since his last office visit. He denies any recent chest pain with exertion or worsening dyspnea. He does experience occasional episodes of pain along his sternal region which occurs at rest or with activity but typically resolves within seconds to minutes. Reports this resembles his prior episodes of GERD and has not his usual anginal equivalent. He is able to work daily and perform yard work without anginal symptoms. Activity is limited by lower back pain as he has undergone multiple spinal surgeries in the past.   He reports good compliance with his medication regimen but does frequently skip his morning dose of Lopressor. He does not check his blood pressure regularly at home but it is well-controlled at 118/58 during today's visit.  He is still smoking 0.5 packs per day and is not interested in quitting at this time.   Past Medical History:  Diagnosis Date  . Arthritis    RA  . Broken neck ([place]) 2008   fall from ladder  . CAD (coronary artery disease)    a. s/p CABG in 2009 with LIMA-LAD, free RIMA-OM1, and seq  SVG-PDA-PLA b. s/p PTCA of 99% stenosis along PDA in 2011 c. cath in 2015 showing severe native CAD with patent grafts except known occlusion of sequential limb of the SVG-PLA  . GERD (gastroesophageal reflux disease)   . History of Doppler ultrasound 04/13/2006   LEAs; bilat ABIs - no evidence of arterial insuff.; bilat PVRs - normal; irregular non-hemodynamically significant plaque bilat in SFA  . History of echocardiogram 10/29/2009   EF 01-75%; LV systolic function normal; mildly sclerotic AV  . Hyperlipidemia   . Hypertension   . Myocardial infarction [facility]) 04/2014    Past Surgical History:  Procedure Laterality Date  . CARDIAC CATHETERIZATION  10/24/2009   r/t CP; significant native CAD, patent LIMA graft to LAD & patent RIMA to obtuse marginal vessel; graft to RCA 99% stenosed at anastomses; stenting to RCA reduced to 20-30%  . CARDIAC CATHETERIZATION  04/30/2014  . CARPAL TUNNEL RELEASE  2000  . CORONARY ARTERY BYPASS GRAFT  2009   revascularization by Dr. Koleen Nimrod; LIMA to LAD, free RIMA graft to OM1, sequential vein gradt to PDA & PL segment  . KNEE SURGERY     Left Knee (1992), Right Knee (1998);   Marland Kitchen LEFT HEART CATHETERIZATION WITH CORONARY/GRAFT ANGIOGRAM N/A 11/29/2012   Procedure: LEFT HEART CATHETERIZATION WITH Beatrix Fetters;  Surgeon: Troy Sine, MD;  Location: [facility] C[facility] LAB;  Service: Cardiovascular;  Laterality: N/A;  . LEFT HEART CATHETERIZATION WITH CORONARY/GRAFT ANGIOGRAM N/A 04/30/2014   Procedure: LEFT HEART CATHETERIZATION WITH Beatrix Fetters;  Surgeon: Sinclair Grooms, MD;  Location: [facility] CATH LAB;  Service: Cardiovascular;  Laterality: N/A;  . LUMBAR SPINE SURGERY  2004   s/p fusion L4-S1; 4 back surgeries  . neck fusion  1999  . PARATHYROIDECTOMY  2001  . SKIN CANCER EXCISION  1999   face    Current Medications: Outpatient Medications Prior to Visit  Medication Sig Dispense Refill  . aspirin EC 81 MG tablet Take 81 mg by mouth  daily.    Marland Kitchen docusate sodium (COLACE) 100 MG capsule Take 100 mg by mouth daily.    Marland Kitchen HYDROcodone-acetaminophen (NORCO/VICODIN) 5-325 MG per tablet Take 1-2 tablets by mouth as needed.    . loratadine (CLARITIN) 10 MG tablet Take 10 mg by mouth daily as needed for allergies.    . nitroGLYCERIN (NITROSTAT) 0.4 MG SL tablet Place 0.4 mg under the tongue every 5 (five) minutes as needed for chest pain.    . pantoprazole (PROTONIX) 40 MG tablet Take 40 mg by mouth daily.     Marland Kitchen amLODipine (NORVASC) 10 MG tablet Take 1 tablet (10 mg total) by mouth daily. 90 tablet 3  . atorvastatin (LIPITOR) 40 MG tablet Take 40 mg by mouth daily.    . isosorbide mononitrate (IMDUR) 120 MG 24 hr tablet TAKE ONE TABLET BY MOUTH ONCE DAILY 90 tablet 0  . lisinopril (PRINIVIL,ZESTRIL) 20 MG tablet Take 20 mg by mouth daily.    . metoprolol tartrate (LOPRESSOR) 25 MG tablet TAKE ONE TABLET BY MOUTH TWICE DAILY 60 tablet 0  . albuterol (PROVENTIL HFA;VENTOLIN HFA) 108 (90 BASE) MCG/ACT inhaler Inhale 2 puffs into the lungs as directed.     No facility-administered medications prior to visit.      Allergies:   Patient has no known allergies.   Social History   Social History  . Marital status: Married    Spouse name: N/A  . Number of children: N/A  . Years of education: N/A   Social History Main Topics  . Smoking status: Current Every Day Smoker    Years: 30.00    Types: Cigarettes  . Smokeless tobacco: Never Used     Comment: 3-4 cigarettes per day  . Alcohol use No  . Drug use: No  . Sexual activity: Not Asked   Other Topics Concern  . None   Social History Narrative  . None     Family History:  The patient's family history includes Heart attack in his mother; Heart disease in his father and mother; Hyperlipidemia in his mother and sister; Stroke in his father.   Review of Systems:   Please see the history of present illness.     General:  No chills, fever, night sweats or weight changes.  Positive for low back pain.  Cardiovascular:  No dyspnea on exertion, edema, orthopnea, palpitations, paroxysmal nocturnal dyspnea. Positive for chest pain.  Dermatological: No rash, lesions/masses Respiratory: No cough, dyspnea Urologic: No hematuria, dysuria Abdominal:   No nausea, vomiting, diarrhea, bright red blood per rectum, melena, or hematemesis Neurologic:  No visual changes, wkns, changes in mental status. All other systems reviewed and are otherwise negative except as noted above.   Physical Exam:    VS:  BP (!) 118/58   Pulse 65   Ht 5' 2.5" (1.588 m)   Wt 180 lb (81.6 kg)   BMI 32.40 kg/m  General: Well developed, well nourished Caucasian male appearing in no acute distress. Head: Normocephalic, atraumatic, sclera non-icteric, no xanthomas, nares are without discharge.  Neck: No carotid bruits. JVD not elevated.  Lungs: Respirations regular and unlabored, without wheezes or rales.  Heart: Regular rate and rhythm. No S3 or S4.  No murmur, no rubs, or gallops appreciated. Abdomen: Soft, non-tender, non-distended with normoactive bowel sounds. No hepatomegaly. No rebound/guarding. No obvious abdominal masses. Msk:  Strength and tone appear normal for age. No joint deformities or effusions. Extremities: No clubbing or cyanosis. No lower extremity edema.  Distal pedal pulses are 2+ bilaterally. Neuro: Alert and oriented X 3. Moves all extremities spontaneously. No focal deficits noted. Psych:  Responds to questions appropriately with a normal affect. Skin: No rashes or lesions noted  Wt Readings from Last 3 Encounters:  01/03/17 180 lb (81.6 kg)  10/03/15 183 lb (83 kg)  03/10/15 172 lb 9.6 oz (78.3 kg)      Studies/Labs Reviewed:   EKG:  EKG is ordered today.  The ekg ordered today demonstrates NSR, HR 65, with no acute ST or T-wave changes.   Recent Labs: No results found for requested labs within last 8760 hours.   Lipid Panel    Component Value Date/Time     CHOL 136 09/02/2014 0803   TRIG 243 (H) 09/02/2014 0803   HDL 28 (L) 09/02/2014 0803   CHOLHDL 4.9 09/02/2014 0803   VLDL 49 (H) 09/02/2014 0803   LDLCALC 59 09/02/2014 0803    Additional studies/ records that were reviewed today include:   Cardiac Catheterization: 04/2014 ANGIOGRAPHIC DATA:   The left main coronary artery is diffusely diseased and heavily calcified..  The left anterior descending artery is totally occluded at the left main.  The left circumflex artery is diffusely diseased with high-grade obstruction prior to bifurcation into 2 obtuse marginal branches. The obtuse marginal #1 is supplied by the free radial.  The right coronary artery is severely and diffusely diseased with 95% stenosis distally. Competitive flow is noted in the PDA. LV branch is also supplied. This appearance is unchanged from the prior study in 2014.Marland Kitchen  BYPASS GRAFT ANGIOGRAPHY:  LIMA to LAD is widely patent. Free radial to ramus intermedius is widely patent. SVG to PDA is widely patent.  LEFT VENTRICULOGRAM:  Left ventricular angiogram was done in the 30 RAO projection and revealed mild inferior hypokinesis. EF is 45-50%.   IMPRESSIONS:  1. Severe native coronary disease with total occlusion of the LAD, high-grade obstruction in the proximal and mid circumflex, and high-grade diffuse calcified disease proximal to distal RCA. These findings are unchanged compared to 2014 angiogram. 2. Widely patent LIMA to LAD, free RIMA to obtuse marginal, and SVG to PDA. Appearance is unchanged from prior angiogram one year ago. 3. Low normal to mildly depressed LVEF with normal filling pressures.   RECOMMENDATION:  Explanation for sudden dyspnea and elevated troponins is not apparent. Has pulmonary embolism being considered?.  Assessment:    1. Atherosclerosis of coronary artery bypass graft of native heart with stable angina pectoris ([place])   2. Essential hypertension   3. Hyperlipidemia with  target LDL less than 70   4. Gastroesophageal reflux disease without esophagitis   5. Tobacco use      Plan:   In order of problems listed above:  1. CAD - s/p CABG in 2009 with LIMA-LAD, free RIMA-OM1, and seq SVG-PDA-PLA, s/p PTCA of 99% stenosis along PDA in 2011. Cath in 2015 showed severe  native CAD with patent grafts except known occlusion of sequential limb of the SVG-PLA. - he denies any exertional chest pain or dyspnea on exertion. Is able to perform daily activities and yard work without anginal symptoms. Does have occasional pain along his sternum which lasts for seconds at a time and is similar to prior episodes of GERD.  - EKG today shows NSR with no acute ST or T-wave changes. Will not pursue further ischemic evaluation at this time. If he develops exertional symptoms or symptoms similar to his prior angina, would plan for a repeat NST.  - continue medical therapy with ASA, statin, Imdur, and BB therapy.   2. HTN - BP is well-controlled at 118/58 during today's visit. - continue Amlodipine 10mg  daily, Imdur 120mg  daily, Lisinopril 20mg  daily, and Lopressor 25mg  BID. He reports only taking his Lopressor in the evening due to forgetting his AM dose. We discussed switching to Toprol-XL but this is a Tier 4 medication per his insurance, therefore he plans to set a reminder to take his morning dose.   3. HLD - recent Lipid Panel checked by his PCP on 11/08/2016 showed total cholesterol of 123, HDL 26, and LDL 52. At goal with LDL < 70. - continue Atorvastatin 40mg  daily.    4. GERD - remains on Protonix 40mg  daily.   5. Tobacco Use - he continues to smoke 0.5 ppd. Cessation advised.    Medication Adjustments/Labs and Tests Ordered: Current medicines are reviewed at length with the patient today.  Concerns regarding medicines are outlined above.  Medication changes, Labs and Tests ordered today are listed in the Patient Instructions below. Patient Instructions  Medication  Instructions:  Your physician recommends that you continue on your current medications as directed. Please refer to the Current Medication list given to you today.  If you need a refill on your cardiac medications before your next appointment, please call your pharmacy.  Follow-Up: Your physician wants you to follow-up in: [place]. You should receive a reminder letter in the mail two months in advance. If you do not receive a letter, please call our office MAY 2019 to schedule the AUGUST 2019 follow-up appointment.  Thank you for choosing [facility] at [facility], Erma Heritage, PA-C  01/03/2017 2:20 PM    [address], [place], [place]  [address] Phone: [phone]; Fax: [phone]  [address] [place], [place] [address] Phone: [phone]

## 2017-01-03 ENCOUNTER — Ambulatory Visit (INDEPENDENT_AMBULATORY_CARE_PROVIDER_SITE_OTHER): Payer: Medicare HMO | Admitting: Student

## 2017-01-03 ENCOUNTER — Encounter: Payer: Self-pay | Admitting: Student

## 2017-01-03 VITALS — BP 118/58 | HR 65 | Ht 62.5 in | Wt 180.0 lb

## 2017-01-03 DIAGNOSIS — Z720 Tobacco use: Secondary | ICD-10-CM

## 2017-01-03 DIAGNOSIS — I25708 Atherosclerosis of coronary artery bypass graft(s), unspecified, with other forms of angina pectoris: Secondary | ICD-10-CM | POA: Diagnosis not present

## 2017-01-03 DIAGNOSIS — E785 Hyperlipidemia, unspecified: Secondary | ICD-10-CM

## 2017-01-03 DIAGNOSIS — I10 Essential (primary) hypertension: Secondary | ICD-10-CM | POA: Diagnosis not present

## 2017-01-03 DIAGNOSIS — K219 Gastro-esophageal reflux disease without esophagitis: Secondary | ICD-10-CM

## 2017-01-03 MED ORDER — ATORVASTATIN CALCIUM 40 MG PO TABS: 40.0000 mg | ORAL_TABLET | Freq: Every day | ORAL | 3 refills | Status: DC

## 2017-01-03 MED ORDER — AMLODIPINE BESYLATE 10 MG PO TABS: 10.0000 mg | ORAL_TABLET | Freq: Every day | ORAL | 3 refills | Status: DC

## 2017-01-03 MED ORDER — METOPROLOL TARTRATE 25 MG PO TABS: 25.0000 mg | ORAL_TABLET | Freq: Two times a day (BID) | ORAL | 3 refills | Status: DC

## 2017-01-03 MED ORDER — ISOSORBIDE MONONITRATE ER 120 MG PO TB24: 120.0000 mg | ORAL_TABLET | Freq: Every day | ORAL | 3 refills | Status: DC

## 2017-01-03 MED ORDER — LISINOPRIL 20 MG PO TABS: 20.0000 mg | ORAL_TABLET | Freq: Every day | ORAL | 3 refills | Status: DC

## 2017-01-03 NOTE — Patient Instructions (Signed)
Medication Instructions:  Your physician recommends that you continue on your current medications as directed. Please refer to the Current Medication list given to you today.  If you need a refill on your cardiac medications before your next appointment, please call your pharmacy.  Follow-Up: Your physician wants you to follow-up in: [place]. You should receive a reminder letter in the mail two months in advance. If you do not receive a letter, please call our office MAY 2019 to schedule the AUGUST 2019 follow-up appointment.  Thank you for choosing [facility] at [facility]!!

## 2017-01-22 ENCOUNTER — Other Ambulatory Visit: Payer: Self-pay | Admitting: Cardiovascular Disease

## 2017-01-24 NOTE — Telephone Encounter (Signed)
Covering Robert Melton's box - can you call patient and ask if he has been taking? It was on his list on last visit with Dr. Claiborne Billings but not on OV with Robert. Thanks. Merland Holness PA-C

## 2017-01-26 NOTE — Telephone Encounter (Signed)
Left message to call back  

## 2017-01-26 NOTE — Telephone Encounter (Signed)
New message      *STAT* If patient is at the pharmacy, call can be transferred to refill team.   1. Which medications need to be refilled? (please list name of each medication and dose if known) clopidogrel 75mg   2. Which pharmacy/location (including street and city if local pharmacy) is medication to be sent to? [facility] = [place]   3. Do they need a 30 day or 90 day supply?  [place]

## 2017-01-27 MED ORDER — CLOPIDOGREL BISULFATE 75 MG PO TABS: ORAL_TABLET | ORAL | 1 refills | Status: DC

## 2017-01-27 NOTE — Telephone Encounter (Addendum)
Patient is currently taking Plavix.  Reviewed with PA, ok to continue.      New rx sent for 90 day supply.  Patient not due for f/u until Feb.

## 2017-01-27 NOTE — Addendum Note (Signed)
Addended by: Patria Mane A on: 01/27/2017 10:30 AM   Modules accepted: Orders

## 2017-08-22 ENCOUNTER — Other Ambulatory Visit: Payer: Self-pay | Admitting: Cardiovascular Disease

## 2017-12-31 ENCOUNTER — Other Ambulatory Visit: Payer: Self-pay | Admitting: Student

## 2018-01-30 ENCOUNTER — Other Ambulatory Visit: Payer: Self-pay | Admitting: Student

## 2018-02-14 ENCOUNTER — Ambulatory Visit: Payer: Medicare HMO | Admitting: Physician Assistant

## 2018-02-20 ENCOUNTER — Ambulatory Visit: Payer: Medicare HMO | Admitting: Physician Assistant

## 2018-02-20 ENCOUNTER — Encounter: Payer: Self-pay | Admitting: Physician Assistant

## 2018-02-20 VITALS — BP 128/78 | HR 62 | Ht 62.5 in | Wt 180.2 lb

## 2018-02-20 DIAGNOSIS — I25810 Atherosclerosis of coronary artery bypass graft(s) without angina pectoris: Secondary | ICD-10-CM

## 2018-02-20 DIAGNOSIS — R7303 Prediabetes: Secondary | ICD-10-CM | POA: Diagnosis not present

## 2018-02-20 DIAGNOSIS — I10 Essential (primary) hypertension: Secondary | ICD-10-CM | POA: Diagnosis not present

## 2018-02-20 DIAGNOSIS — E785 Hyperlipidemia, unspecified: Secondary | ICD-10-CM

## 2018-02-20 NOTE — Progress Notes (Signed)
Cardiology Office Note    Date:  02/22/2018   ID:  Robert Melton, DOB June 03, 1950, MRN [id number]  PCP:  Robert Saunas, MD  Cardiologist:  Dr. Claiborne Billings   Chief Complaint  Patient presents with  . Follow-up    seen for Dr. Claiborne Billings. Annual visit    History of Present Illness:  Robert Melton is a 67 y.o. male with PMH of CAD s/p CABG 2009 (LIMA-LAD, free RIMA-OM1, seq SVG-PDA-PLA), HTN, HLD, and RA.  Patient had PTCA of PDA in 2011.  Cardiac catheterization in 2015 showed severe native CAD with patent grafts except for known occlusion of sequential limb of SVG to PLA.  Patient was last seen by Robert Melton on 01/03/2017 at which time he was doing well denying any significant chest discomfort.  He was still smoking and was not interested in quitting at the time.  Patient presents today for cardiology office visit.  He denies any exertional symptoms.  He complains of occasional acid reflux pain, however this is quite rare and it does not happen with exertion.  The symptom is relieved by burping.  Last lipid panel obtained on 08/18/2017 showed HDL 24, triglyceride 253, LDL 49, total cholesterol 124.  When compared to the previous lab work, his HDL has been decreasing since January 2017, and his triglyceride has been increasing.  I recommended for him to take fish oil on a daily basis.  He has not been exercising much due to significant back pain.  Last hemoglobin A1c was in April 2019 was 5.7, this has improved from 6.0 in November of last year.  Otherwise, he has been doing well.  Decrease he continues to smoke half a pack per day and is not interested in quitting smoking.  He can follow-up with cardiology service in 1 year or earlier if he has increasing chest discomfort or exertional symptoms.   Past Medical History:  Diagnosis Date  . Arthritis    RA  . Broken neck ([place]) 2008   fall from ladder  . CAD (coronary artery disease)    a. s/p CABG in 2009 with LIMA-LAD, free RIMA-OM1, and seq  SVG-PDA-PLA b. s/p PTCA of 99% stenosis along PDA in 2011 c. cath in 2015 showing severe native CAD with patent grafts except known occlusion of sequential limb of the SVG-PLA  . GERD (gastroesophageal reflux disease)   . History of Doppler ultrasound 04/13/2006   LEAs; bilat ABIs - no evidence of arterial insuff.; bilat PVRs - normal; irregular non-hemodynamically significant plaque bilat in SFA  . History of echocardiogram 10/29/2009   EF 94-70%; LV systolic function normal; mildly sclerotic AV  . Hyperlipidemia   . Hypertension   . Myocardial infarction [facility]) 04/2014    Past Surgical History:  Procedure Laterality Date  . CARDIAC CATHETERIZATION  10/24/2009   r/t CP; significant native CAD, patent LIMA graft to LAD & patent RIMA to obtuse marginal vessel; graft to RCA 99% stenosed at anastomses; stenting to RCA reduced to 20-30%  . CARDIAC CATHETERIZATION  04/30/2014  . CARPAL TUNNEL RELEASE  2000  . CORONARY ARTERY BYPASS GRAFT  2009   revascularization by Dr. Koleen Melton; LIMA to LAD, free RIMA graft to OM1, sequential vein gradt to PDA & PL segment  . KNEE SURGERY     Left Knee (1992), Right Knee (1998);   Marland Kitchen LEFT HEART CATHETERIZATION WITH CORONARY/GRAFT ANGIOGRAM N/A 11/29/2012   Procedure: LEFT HEART CATHETERIZATION WITH Robert Melton;  Surgeon: Robert Sine, MD;  Location: [facility] CATH LAB;  Service: Cardiovascular;  Laterality: N/A;  . LEFT HEART CATHETERIZATION WITH CORONARY/GRAFT ANGIOGRAM N/A 04/30/2014   Procedure: LEFT HEART CATHETERIZATION WITH Robert Melton;  Surgeon: Robert Grooms, MD;  Location: [facility] CATH LAB;  Service: Cardiovascular;  Laterality: N/A;  . LUMBAR SPINE SURGERY  2004   s/p fusion L4-S1; 4 back surgeries  . neck fusion  1999  . PARATHYROIDECTOMY  2001  . SKIN CANCER EXCISION  1999   face    Current Medications: Outpatient Medications Prior to Visit  Medication Sig Dispense Refill  . albuterol (PROVENTIL HFA;VENTOLIN HFA) 108 (90  BASE) MCG/ACT inhaler Inhale 2 puffs into the lungs as directed.    Marland Kitchen amLODipine (NORVASC) 10 MG tablet Take 1 tablet (10 mg total) by mouth daily. KEEP OV. 90 tablet 3  . aspirin EC 81 MG tablet Take 81 mg by mouth daily.    Marland Kitchen atorvastatin (LIPITOR) 40 MG tablet Take 1 tablet (40 mg total) by mouth daily. 90 tablet 3  . clopidogrel (PLAVIX) 75 MG tablet TAKE 1 TABLET BY MOUTH ONCE DAILY 90 tablet 1  . docusate sodium (COLACE) 100 MG capsule Take 100 mg by mouth daily.    Marland Kitchen HYDROcodone-acetaminophen (NORCO/VICODIN) 5-325 MG per tablet Take 1-2 tablets by mouth as needed.    . isosorbide mononitrate (IMDUR) 120 MG 24 hr tablet Take 1 tablet (120 mg total) by mouth daily. NEED OV. 90 tablet 0  . lisinopril (PRINIVIL,ZESTRIL) 20 MG tablet Take 1 tablet (20 mg total) by mouth daily. 90 tablet 3  . loratadine (CLARITIN) 10 MG tablet Take 10 mg by mouth daily as needed for allergies.    . metoprolol tartrate (LOPRESSOR) 25 MG tablet Take 1 tablet (25 mg total) by mouth 2 (two) times daily. 180 tablet 3  . nitroGLYCERIN (NITROSTAT) 0.4 MG SL tablet Place 0.4 mg under the tongue every 5 (five) minutes as needed for chest pain.    . pantoprazole (PROTONIX) 40 MG tablet Take 40 mg by mouth daily.     . Omega-3 Fatty Acids (FISH OIL) 1000 MG CAPS Take 1 gram twice a day 30 capsule 0   No facility-administered medications prior to visit.      Allergies:   Patient has no known allergies.   Social History   Socioeconomic History  . Marital status: Married    Spouse name: Not on file  . Number of children: Not on file  . Years of education: Not on file  . Highest education level: Not on file  Occupational History  . Not on file  Social Needs  . Financial resource strain: Not on file  . Food insecurity:    Worry: Not on file    Inability: Not on file  . Transportation needs:    Medical: Not on file    Non-medical: Not on file  Tobacco Use  . Smoking status: Current Every Day Smoker    Years:  30.00    Types: Cigarettes  . Smokeless tobacco: Never Used  . Tobacco comment: 3-4 cigarettes per day  Substance and Sexual Activity  . Alcohol use: No  . Drug use: No  . Sexual activity: Not on file  Lifestyle  . Physical activity:    Days per week: Not on file    Minutes per session: Not on file  . Stress: Not on file  Relationships  . Social connections:    Talks on phone: Not on file    Gets together: Not  on file    Attends religious service: Not on file    Active member of club or organization: Not on file    Attends meetings of clubs or organizations: Not on file    Relationship status: Not on file  Other Topics Concern  . Not on file  Social History Narrative  . Not on file     Family History:  The patient's family history includes Heart attack in his mother; Heart disease in his father and mother; Hyperlipidemia in his mother and sister; Stroke in his father.   ROS:   Please see the history of present illness.    ROS All other systems reviewed and are negative.   PHYSICAL EXAM:   VS:  BP 128/78   Pulse 62   Ht 5' 2.5" (1.588 m)   Wt 180 lb 4 oz (81.8 kg)   SpO2 98%   BMI 32.44 kg/m    GEN: Well nourished, well developed, in no acute distress  HEENT: normal  Neck: no JVD, carotid bruits, or masses Cardiac: RRR; no murmurs, rubs, or gallops,no edema  Respiratory:  clear to auscultation bilaterally, normal work of breathing GI: soft, nontender, nondistended, + BS MS: no deformity or atrophy  Skin: warm and dry, no rash Neuro:  Alert and Oriented x 3, Strength and sensation are intact Psych: euthymic mood, full affect  Wt Readings from Last 3 Encounters:  02/20/18 180 lb 4 oz (81.8 kg)  01/03/17 180 lb (81.6 kg)  10/03/15 183 lb (83 kg)      Studies/Labs Reviewed:   EKG:  EKG is ordered today.  The ekg ordered today demonstrates NSR without significant ST-T wave changes  Recent Labs: No results found for requested labs within last 8760 hours.    Lipid Panel    Component Value Date/Time   CHOL 136 09/02/2014 0803   TRIG 243 (H) 09/02/2014 0803   HDL 28 (L) 09/02/2014 0803   CHOLHDL 4.9 09/02/2014 0803   VLDL 49 (H) 09/02/2014 0803   LDLCALC 59 09/02/2014 0803    Additional studies/ records that were reviewed today include:   Cath 04/30/2014 The left anterior descending artery is totally occluded at the left main.  The left circumflex artery is diffusely diseased with high-grade obstruction prior to bifurcation into 2 obtuse marginal branches. The obtuse marginal #1 is supplied by the free radial.  The right coronary artery is severely and diffusely diseased with 95% stenosis distally. Competitive flow is noted in the PDA. LV branch is also supplied. This appearance is unchanged from the prior study in 2014.Marland Kitchen  BYPASS GRAFT ANGIOGRAPHY:  LIMA to LAD is widely patent. Free radial to ramus intermedius is widely patent. SVG to PDA is widely patent.  LEFT VENTRICULOGRAM:  Left ventricular angiogram was done in the 30 RAO projection and revealed mild inferior hypokinesis. EF is 45-50%.   IMPRESSIONS:  1. Severe native coronary disease with total occlusion of the LAD, high-grade obstruction in the proximal and mid circumflex, and high-grade diffuse calcified disease proximal to distal RCA. These findings are unchanged compared to 2014 angiogram. 2. Widely patent LIMA to LAD, free RIMA to obtuse marginal, and SVG to PDA. Appearance is unchanged from prior angiogram one year ago. 3. Low normal to mildly depressed LVEF with normal filling pressures.    ASSESSMENT:    1. Coronary artery disease involving coronary bypass graft of native heart without angina pectoris   2. Benign hypertension   3. Hyperlipidemia, unspecified hyperlipidemia type  4. Prediabetes      PLAN:  In order of problems listed above:  1. CAD: Denies any recent chest discomfort or shortness of breath.  He does have occasional acid reflux pain,  this is quite rare and it does not associated with exertion.  He is aware that he will need to inform us if he has more frequent chest pain or any symptoms associated with exertion.  Continue aspirin and statin.  On metoprolol.  2. Hypertension: Blood pressure stable  3. Hyperlipidemia: On Lipitor 40 mg daily.  Triglyceride has been elevated around 250, add over-the-counter fish oil  4. Prediabetes: Last hemoglobin A1c has improved from 6.0 to 5.7.    Medication Adjustments/Labs and Tests Ordered: Current medicines are reviewed at length with the patient today.  Concerns regarding medicines are outlined above.  Medication changes, Labs and Tests ordered today are listed in the Patient Instructions below. Patient Instructions  Medication Instructions:   Start over the counter Fish Oil 1 gram twice a day   If you need a refill on your cardiac medications before your next appointment, please call your pharmacy.   Lab work:  None ordered    Testing/Procedures:  None ordered  Follow-Up: At [facility], you and your health needs are our priority.  As part of our continuing mission to provide you with exceptional heart care, we have created designated Provider Care Teams.  These Care Teams include your primary Cardiologist (physician) and Advanced Practice Providers (APPs -  Physician Assistants and Nurse Practitioners) who all work together to provide you with the care you need, when you need it. Follow up with Dr.Kelly in 1 year  Call office 2 months before to schedule       Signed, Almyra Deforest, [place]  02/22/2018 10:18 PM    [address], [place], [place]  [address] Phone: [phone]; Fax: [phone]

## 2018-02-20 NOTE — Patient Instructions (Signed)
Medication Instructions:   Start over the counter Fish Oil 1 gram twice a day   If you need a refill on your cardiac medications before your next appointment, please call your pharmacy.   Lab work:  None ordered    Testing/Procedures:  None ordered  Follow-Up: At [facility], you and your health needs are our priority.  As part of our continuing mission to provide you with exceptional heart care, we have created designated Provider Care Teams.  These Care Teams include your primary Cardiologist (physician) and Advanced Practice Providers (APPs -  Physician Assistants and Nurse Practitioners) who all work together to provide you with the care you need, when you need it. Follow up with Dr.Kelly in 1 year  Call office 2 months before to schedule

## 2018-02-22 ENCOUNTER — Encounter: Payer: Self-pay | Admitting: Physician Assistant

## 2018-03-02 ENCOUNTER — Other Ambulatory Visit: Payer: Self-pay | Admitting: Cardiovascular Disease

## 2018-03-15 ENCOUNTER — Other Ambulatory Visit: Payer: Self-pay | Admitting: Student

## 2018-03-16 NOTE — Telephone Encounter (Signed)
Rx has been sent to the pharmacy electronically. ° °

## 2018-03-22 ENCOUNTER — Other Ambulatory Visit: Payer: Self-pay | Admitting: Student

## 2018-04-07 ENCOUNTER — Other Ambulatory Visit: Payer: Self-pay | Admitting: Student

## 2018-09-26 ENCOUNTER — Other Ambulatory Visit: Payer: Self-pay

## 2018-09-26 MED ORDER — ISOSORBIDE MONONITRATE ER 120 MG PO TB24: 120.0000 mg | ORAL_TABLET | Freq: Every day | ORAL | 3 refills | Status: DC

## 2018-09-26 MED ORDER — AMLODIPINE BESYLATE 10 MG PO TABS: 10.0000 mg | ORAL_TABLET | Freq: Every day | ORAL | 3 refills | Status: DC

## 2018-09-27 ENCOUNTER — Other Ambulatory Visit: Payer: Self-pay

## 2018-09-27 MED ORDER — AMLODIPINE BESYLATE 10 MG PO TABS: 10.0000 mg | ORAL_TABLET | Freq: Every day | ORAL | 3 refills | Status: DC

## 2018-09-27 MED ORDER — CLOPIDOGREL BISULFATE 75 MG PO TABS: 75.0000 mg | ORAL_TABLET | Freq: Every day | ORAL | 3 refills | Status: DC

## 2018-09-28 ENCOUNTER — Other Ambulatory Visit: Payer: Self-pay

## 2018-09-28 MED ORDER — AMLODIPINE BESYLATE 10 MG PO TABS: 10.0000 mg | ORAL_TABLET | Freq: Every day | ORAL | 2 refills | Status: DC

## 2019-03-30 ENCOUNTER — Telehealth: Payer: Self-pay

## 2019-03-30 NOTE — Telephone Encounter (Signed)
   [facility] Pre-operative Risk Assessment    Request for surgical clearance:  1. What type of surgery is being performed? Right Total Knee Arthroplasty  2. When is this surgery scheduled?  TBD  3. What type of clearance is required Both  4. Are there any medications that need to be held prior to surgery and how long? Plavix and Aspirin  5. Practice name and name of physician performing surgery? [facility]  Dr.Matthew Olin  6. What is your office phone number [phone]   7.   What is your office fax number [phone]  8.   Anesthesia type  Spinal   Kathyrn Lass 03/30/2019, 3:45 PM  _________________________________________________________________   (provider comments below)

## 2019-03-30 NOTE — Telephone Encounter (Signed)
   Primary Cardiologist: Shelva Majestic, MD  Chart reviewed as part of pre-operative protocol coverage. Patient is due for yearly follow-up and actually has appointment on Monday 04/02/19 in-person with Dr. Claiborne Billings. Instead of trying to clear over the phone without updated evaluation, would recommend to keep this appointment at which time clearance can be addressed. Added pre-op clearance modifier to appointment notes. Per office protocol, the provider should assess clearance at time of office visit and should forward their finalized clearance recommendations to requesting party below. I will send Dr. Claiborne Billings an Juluis Rainier and remove this message from the pre-op box. Will also route update to requesting party via epic fax.  Charlie Pitter, PA-C 03/30/2019, 4:05 PM

## 2019-04-02 ENCOUNTER — Encounter: Payer: Self-pay | Admitting: Cardiovascular Disease

## 2019-04-02 ENCOUNTER — Other Ambulatory Visit: Payer: Self-pay

## 2019-04-02 ENCOUNTER — Ambulatory Visit: Payer: Medicare Other | Admitting: Cardiovascular Disease

## 2019-04-02 DIAGNOSIS — I25810 Atherosclerosis of coronary artery bypass graft(s) without angina pectoris: Secondary | ICD-10-CM | POA: Diagnosis not present

## 2019-04-02 DIAGNOSIS — Z951 Presence of aortocoronary bypass graft: Secondary | ICD-10-CM

## 2019-04-02 DIAGNOSIS — M25559 Pain in unspecified hip: Secondary | ICD-10-CM

## 2019-04-02 DIAGNOSIS — Z01810 Encounter for preprocedural cardiovascular examination: Secondary | ICD-10-CM

## 2019-04-02 DIAGNOSIS — I252 Old myocardial infarction: Secondary | ICD-10-CM

## 2019-04-02 DIAGNOSIS — F17219 Nicotine dependence, cigarettes, with unspecified nicotine-induced disorders: Secondary | ICD-10-CM

## 2019-04-02 DIAGNOSIS — I10 Essential (primary) hypertension: Secondary | ICD-10-CM | POA: Diagnosis not present

## 2019-04-02 DIAGNOSIS — E785 Hyperlipidemia, unspecified: Secondary | ICD-10-CM

## 2019-04-02 NOTE — Patient Instructions (Addendum)
Medication Instructions:  The current medical regimen is effective;  continue present plan and medications as directed. Please refer to the Current Medication list given to you today. If you need a refill on your cardiac medications before your next appointment, please call your pharmacy.  Labwork: FASTING LIPID PANEL, LFT, CMP, TSH, A1C HERE IN OUR OFFICE AT [facility]   If you have labs (blood work) drawn today and your tests are completely normal, you will receive your results only by: Marland Kitchen MyChart Message (if you have MyChart) OR . A paper copy in the mail If you have any lab test that is abnormal or we need to change your treatment, we will call you to review the results.  Testing/Procedures: Your physician has requested that you have a lexiscan myoview. A cardiac stress test is a cardiological test that measures the heart's ability to respond to external stress in a controlled clinical environment. The stress response is induced by intravenous pharmacological stimulation.   Your physician has requested that you have a lower extremity arterial duplex. During this test, exercise and ultrasound are used to evaluate arterial blood flow in the legs. Allow one hour for this exam. There are no restrictions or special instructions.   Special Instructions: WE WILL CALL WITH RESULTS-LEXISCAN, LE DOPPLERS AND IF YOU ARE CLEARED FOR UPCOMING SURGERY AND FOLLOW UP APPOINTMENT   At [facility], you and your health needs are our priority.  As part of our continuing mission to provide you with exceptional heart care, we have created designated Provider Care Teams.  These Care Teams include your primary Cardiologist (physician) and Advanced Practice Providers (APPs -  Physician Assistants and Nurse Practitioners) who all work together to provide you with the care you need, when you need it.  Thank you for choosing [facility] at [facility]!!

## 2019-04-02 NOTE — Progress Notes (Signed)
Patient ID: Robert Melton, male   DOB: [birth year], 68 y.o.   MRN: [id number]    HPI: Robert Melton  is a 68 y.o. male who presents to the office today for a followup cardiology evaluation.  I last saw him in May 2017 but he was evaluated by [place] in 2018 and [facility] in October 2019.  Mr. Ozment has known CAD and in 1993 suffered a myocardial infarction treated with PTCA at [facility]. In December 2009 he was found to have progressive CAD and underwent CABG surgery by Dr. Roxan Hockey (LIMA to the LAD, free RIMA graft to the OM1, and sequential vein graft to the PDA and PLA segment). In June 2011 repeat catheterization for recurrent chest pain showed 99% stenosis at the anastomosis of the vein graft to his right coronary artery. He also had an occluded sequential limb which had previously supplied the PLA vessel. He underwent successful PTCA of the 99% anastomosis stenosis to the PDA at that time and felt markedly improved. In June 2014, he experienced increasing episodes of recurrent anginal symptomatology. He was hospitalized with unstable angina on 11/29/2012 underwent repeat catheterization which showed low normal ejection fraction at 50%. He had significant native CAD with calcified LAD and total occlusion proximally. The was a 90% proximal circumflex stenosis with 70% stenosis in the OM1 vessel. RCA was diffusely diseased and calcified with 30-40 and 70% proximal mid acute marginal stenoses as well as a 40% PLA stenosis. He had been LIMA graft to the LAD, a patent  RIMA graft to the OM1 vessel, and a patent vein graft to the PDA without evidence for restenosis at the anastomosis site but again he had an occluded sequential limb.  During that hospitalization we had increased his medical regimen. He has noticed marked improvement in his prior symptomatology but he still has experienced some episodes of chest pain. An echo Doppler revealed ejection fraction 55-60% with mild aortic valve  sclerosis without stenosis, trivial tricuspid and pulmonic regurgitation. His chest pain symptoms  significantly improved with the addition of Ranexa initially at 500 twice a day.  Subsequently, I recommended further titration to 1000 twice a day.  He was readmitted to [facility] hospital in transfer from [facility] after development of sudden onset of severe shortness of breath on 04/28/2014.  He was noted to have mild troponin elevation due to concern for possible non-ST segment MI.  He was transferred to [facility] hospital.  Subsequent enzymes were negative.  He was treated effectively with nebulizers and his respiratory status improved.  A CT angiogram at [facility] did not show any PE.  However, there was concern for infiltrate and some small pulmonary nodules for which a follow-up CT in one year was recommended.  He underwent repeat cardiac catheterization by Dr. Tamala Julian which essentially was unchanged from his previous catheterization and showed severe native CAD with total occlusion of the LAD, high-grade obstruction in the proximal mid circumflex, high-grade, diffuse calcified disease in the proximal to distal RCA.  He had a widely patent LIMA to the LAD, a free RIMA to the obtuse marginal, and SVG to the PDA with unchanged appearance from one year previously.  I last saw him in October 2017 at which time his exercise was limited due to back and hip discomfort.  Unfortunately he was still smoking one half pack per day.  Due to cost issues, he discontinued Ranexa and as result, we further increased his amlodipine to 10 mg daily.  He was  on metoprolol, tartrate 25 mg twice a day, lisinopril 20 mg daily in addition to isosorbide mononitrate 120 mg.  He denied any anginal type chest pain and was unaware of any change since discontinuing Ranexa and increasing amlodipine.  He was taking pantoprazole for GERD and continued to take atorvastatin for hyperlipidemia.    He was last seen in October 2019 by Desiree Hane.  Lipid panel in April 2019 showed total cholesterol 124, triglycerides 253, HDL 24, and LDL 49.  He had no interest in quitting smoking.  Over the past year, he is daily and started at age 86.  He denies any classic anginal type symptoms but does note rare chest pain more described as cramps.  He has had issues with low back pain and admits to pain with his hips with walking.  He is tentatively scheduled to undergo total knee arthroplasty with Dr. Buena Irish on May 03, 2019.  He presents for preoperative evaluation.  Past Medical History:  Diagnosis Date   Arthritis    RA   Broken neck ([place]) 2008   fall from ladder   CAD (coronary artery disease)    a. s/p CABG in 2009 with LIMA-LAD, free RIMA-OM1, and seq SVG-PDA-PLA b. s/p PTCA of 99% stenosis along PDA in 2011 c. cath in 2015 showing severe native CAD with patent grafts except known occlusion of sequential limb of the SVG-PLA   GERD (gastroesophageal reflux disease)    History of Doppler ultrasound 04/13/2006   LEAs; bilat ABIs - no evidence of arterial insuff.; bilat PVRs - normal; irregular non-hemodynamically significant plaque bilat in SFA   History of echocardiogram 10/29/2009   EF 19-62%; LV systolic function normal; mildly sclerotic AV   Hyperlipidemia    Hypertension    Myocardial infarction ([place]) 04/2014    Past Surgical History:  Procedure Laterality Date   CARDIAC CATHETERIZATION  10/24/2009   r/t CP; significant native CAD, patent LIMA graft to LAD & patent RIMA to obtuse marginal vessel; graft to RCA 99% stenosed at anastomses; stenting to RCA reduced to 20-30%   CARDIAC CATHETERIZATION  04/30/2014   CARPAL TUNNEL RELEASE  2000   CORONARY ARTERY BYPASS GRAFT  2009   revascularization by Dr. Koleen Nimrod; LIMA to LAD, free RIMA graft to OM1, sequential vein gradt to PDA & PL segment   KNEE SURGERY     Left Knee (1992), Right Knee (1998);    LEFT HEART CATHETERIZATION WITH CORONARY/GRAFT ANGIOGRAM  N/A 11/29/2012   Procedure: LEFT HEART CATHETERIZATION WITH Beatrix Fetters;  Surgeon: Troy Sine, MD;  Location: [facility] CATH LAB;  Service: Cardiovascular;  Laterality: N/A;   LEFT HEART CATHETERIZATION WITH CORONARY/GRAFT ANGIOGRAM N/A 04/30/2014   Procedure: LEFT HEART CATHETERIZATION WITH Beatrix Fetters;  Surgeon: Sinclair Grooms, MD;  Location: [facility] CATH LAB;  Service: Cardiovascular;  Laterality: N/A;   LUMBAR SPINE SURGERY  2004   s/p fusion L4-S1; 4 back surgeries   neck fusion  [place]   face    No Known Allergies  Current Outpatient Medications  Medication Sig Dispense Refill   albuterol (PROVENTIL HFA;VENTOLIN HFA) 108 (90 BASE) MCG/ACT inhaler Inhale 2 puffs into the lungs as directed.     amLODipine (NORVASC) 10 MG tablet Take 1 tablet (10 mg total) by mouth daily. KEEP OV. 90 tablet 2   aspirin EC 81 MG tablet Take 81 mg by mouth daily.     atorvastatin (LIPITOR)  40 MG tablet TAKE 1 TABLET BY MOUTH ONCE DAILY 90 tablet 3   clopidogrel (PLAVIX) 75 MG tablet Take 1 tablet (75 mg total) by mouth daily. 90 tablet 3   docusate sodium (COLACE) 100 MG capsule Take 100 mg by mouth daily.     HYDROcodone-acetaminophen (NORCO/VICODIN) 5-325 MG per tablet Take 1-2 tablets by mouth as needed.     isosorbide mononitrate (IMDUR) 120 MG 24 hr tablet Take 1 tablet (120 mg total) by mouth daily. 90 tablet 3   lisinopril (PRINIVIL,ZESTRIL) 20 MG tablet TAKE 1 TABLET BY MOUTH ONCE DAILY 90 tablet 3   Magnesium 100 MG CAPS Take 1 tablet by mouth daily.     metoprolol tartrate (LOPRESSOR) 25 MG tablet Take 1 tablet (25 mg total) by mouth 2 (two) times daily. 180 tablet 3   nitroGLYCERIN (NITROSTAT) 0.4 MG SL tablet Place 0.4 mg under the tongue every 5 (five) minutes as needed for chest pain.     pantoprazole (PROTONIX) 40 MG tablet Take 40 mg by mouth daily.      Potassium 99 MG TABS Take 1 tablet by mouth  daily.     No current facility-administered medications for this visit.     Socially he is married has 4 children 10 grandchildren. There is no alcohol use.  He is resuming smoking for the past year.  ROS General: Negative; No fevers, chills, or night sweats;  HEENT: Negative; No changes in vision or hearing, sinus congestion, difficulty swallowing Pulmonary: Positive for occasional cough.  No wheezing Cardiovascular: See history of present illness; presyncope, syncope, palpitations GI: Negative; No nausea, vomiting, diarrhea, or abdominal pain GU: Negative; No dysuria, hematuria, or difficulty voiding Musculoskeletal: Positive for hip discomfort; in need for total knee arthroplasty Hematologic/Oncology: Negative; no easy bruising, bleeding Endocrine: Negative; no heat/cold intolerance; no diabetes Neuro: Negative; no changes in balance, headaches Skin: Positive for rash, right pretibial lower extremity Psychiatric: Negative; No behavioral problems, depression Sleep: Negative; No snoring, daytime sleepiness, hypersomnolence, bruxism, restless legs, hypnogognic hallucinations, no cataplexy Other comprehensive 14 point system review is negative.  PE BP (!) 142/79    Pulse 63    Temp (!) 96.8 F (36 C)    Ht _0  (1.575 m)    Wt 147 lb 3.2 oz (66.8 kg)    SpO2 99%    BMI 26.92 kg/m    Repeat blood pressure by me 122/72  Wt Readings from Last 3 Encounters:  04/02/19 147 lb 3.2 oz (66.8 kg)  02/20/18 180 lb 4 oz (81.8 kg)  01/03/17 180 lb (81.6 kg)   General: Alert, oriented, no distress.  Skin: normal turgor, no rashes, warm and dry HEENT: Normocephalic, atraumatic. Pupils equal round and reactive to light; sclera anicteric; extraocular muscles intact;  Nose without nasal septal hypertrophy Mouth/Parynx benign; Mallinpatti scale 3 Neck: No JVD, no carotid bruits; normal carotid upstroke Lungs: Decreased breath sounds without wheezing Chest wall: without tenderness to  palpitation Heart: PMI not displaced, RRR, s1 s2 normal, 1/6 systolic murmur, no diastolic murmur, no rubs, gallops, thrills, or heaves Abdomen: soft, nontender; no hepatosplenomehaly, BS+; abdominal aorta nontender and not dilated by palpation. Back: no CVA tenderness Pulses 2+ Musculoskeletal: full range of motion, normal strength, no joint deformities Extremities: no clubbing cyanosis or edema, Homan's sign negative  Neurologic: grossly nonfocal; Cranial nerves grossly wnl Psychologic: Normal mood and affect   ECG (independently read by me): NSR at 63; possible left atrial enlargement.  Mild ST T changes V5 V6  May 2017 ECG (independently read by me): Normal sinus rhythm at 61 bpm.  Nonspecific ST changes.  Normal intervals.  October 2016 ECG (independently read by me): Sinus bradycardia 57 bpm.  Previously noted.  Nondiagnostic ST-T changes anterolaterally.  April 2016 ECG (independently read by me): Sinus bradycardia 55 bpm.  Incomplete right bundle branch block.  Lateral T-wave changes.  December 2015 ECG (independently read by me): Normal sinus rhythm at 65 bpm.  Probable left atrial enlargement.  Previously noted ST-T changes anterolaterally  November  2015 ECG (independently read by me): Sinus bradycardia 51 bpm.  ST-T wave changes laterally  V3 through V6.  QTc interval 409 ms.  August 2015 ECG (independently read by me): Sinus bradycardia 51 beats per minute.  Previously noted T wave abnormality V3-6.  QTc interval normal  09/13/2013 ECG (independently read by me as): Sinus rhythm at 57 beats per minute.  T wave abnormality in V4 through V6 and mildly in 1 and L. which have been present previously, but perhaps may be slightly increased in V5, V6, compared to prior tracing.  Prior 03/16/2013 ECG: Sinus bradycardia at 57 beats per minute. Nonspecific ST abnormalities.  QTc interval 455 msec.  LABS:  BMP Latest Ref Rng & Units 04/03/2019 09/02/2014 04/29/2014  Glucose 65 - 99  mg/dL 99 97 105(H)  BUN 8 - 27 mg/dL _0 Creatinine 0.76 - 1.27 mg/dL 1.03 0.92 0.88  BUN/Creat Ratio 10 - 24 16 - -  Sodium 134 - 144 mmol/L 140 138 141  Potassium 3.5 - 5.2 mmol/L 5.1 4.5 4.6  Chloride 96 - 106 mmol/L 103 103 105  CO2 20 - 29 mmol/L _1 Calcium 8.6 - 10.2 mg/dL 10.0 9.4 9.7   Hepatic Function Latest Ref Rng & Units 04/03/2019 09/02/2014 04/29/2014  Total Protein 6.0 - 8.5 g/dL 6.7 6.7 6.5  Albumin 3.8 - 4.8 g/dL 4.4 4.0 3.4(L)  AST 0 - 40 IU/L _2 ALT 0 - 44 IU/L _3 Alk Phosphatase 39 - 117 IU/L 111 92 86  Total Bilirubin 0.0 - 1.2 mg/dL 0.4 0.5 0.5  Bilirubin, Direct 0.00 - 0.40 mg/dL 0.12 - <0.2   CBC Latest Ref Rng & Units 04/03/2019 09/02/2014 04/30/2014  WBC 3.4 - 10.8 x10E3/uL 8.3 6.8 6.7  Hemoglobin 13.0 - 17.7 g/dL 15.3 14.6 13.2  Hematocrit 37.5 - 51.0 % 44.3 44.3 39.3  Platelets 150 - 450 x10E3/uL 158 163 136(L)   Lab Results  Component Value Date   MCV 90 04/03/2019   MCV 96.1 09/02/2014   MCV 90.1 04/30/2014   Lab Results  Component Value Date   TSH 2.680 04/03/2019   Lab Results  Component Value Date   HGBA1C 6.0 (H) 04/03/2019   Lipid Panel     Component Value Date/Time   CHOL 135 04/03/2019 0941   TRIG 114 04/03/2019 0941   HDL 32 (L) 04/03/2019 0941   CHOLHDL 4.2 04/03/2019 0941   CHOLHDL 4.9 09/02/2014 0803   VLDL 49 (H) 09/02/2014 0803   LDLCALC 82 04/03/2019 0941     RADIOLOGY: Dg Chest 2 View  11/27/2012   *RADIOLOGY REPORT*  Clinical Data: Chest pain and shortness of breath.  Preop cardiac cath.  CHEST - 2 VIEW  Comparison: 10/21/2009.  Findings: The cardiac silhouette, mediastinal and hilar contours are normal and stable.  Stable surgical changes from bypass surgery.  The lungs are clear.  No pleural effusion.  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  IMPRESSION: No acute cardiopulmonary findings.   Original Report Authenticated By: Marijo Sanes, M.D.     IMPRESSION: 1. Coronary artery disease involving coronary bypass graft of native heart without angina pectoris   2. Old inferior wall myocardial infarction   3. Hx of CABG   4. Essential hypertension   5. Hyperlipidemia with target LDL less than 70   6. Preop cardiovascular exam   7. Cigarette nicotine dependence with nicotine-induced disorder   8. Hip pain    ASSESSMENT AND PLAN: Mr. Stawicki is a 67 year old Caucasian male who suffered initial myocardial infarction in 1993 and underwent CABG revascularization in December 2009 by Dr. Roxan Hockey with a LIMA to the LAD, free RIMA graft to the OM1 with an sequential vein graft to the PDA and PLA.  Catheterization in 2011 showed 99% stenosis at the anastomosis of the vein graft to the RCA and he also was found to have an occluded sequential limb which previously supplied the PLA vessel.  He underwent successful PTCA of his anastomosis stenosis.  At last catheterization in 2014, there was significant native CAD but patent grafts without evidence for restenosis at the anastomosis site of his RCA but with the previously noted occluded sequential limb. He has potential sources of native vessel ischemia not supplied well by the grafts.  He had noticed significant improvement in his symptoms with the addition of Ranexa which had been titrated to 1000 mg twice a day.  Due to significant cost issues, he called the office and ultimately discontinued this therapy and in its place amlodipine was further titrated to 10 mg.  Presently, he is in need for knee surgery which is tentatively scheduled for May 03, 2019.  He has been smoking for over 53 years and is still smoking 1/2 pack/day with no interest to quit.  He denies any classic exertional anginal type symptoms but does admit to some occasional rare chest pain episodes which are nonexertional.  He also has difficulty with his low back as well as hip discomfort with walking.  I have recommended he undergo  a preoperative Lexiscan Myoview study to make sure there is no significant ischemia though I anticipate that he most likely will have ischemia in the posterolateral territory.  In addition, with his extensive smoking history in the concurrence of PVD associated with CAD, I have suggested he undergo lower extremity arterial Doppler studies to assess for peripheral vascular disease particularly with his hip discomfort with walking.  At present, his blood pressure is controlled on amlodipine 10 mg, isosorbide 120 mg, lisinopril 20 mg in addition to metoprolol tartrate 12.5 mg twice a day.  He continues to be on atorvastatin 40 mg and over-the-counter fish oil for hyperlipidemia.  He continues to take Protonix for GERD.  I am recommending fasting laboratory including comprehensive metabolic panel, CBC, TSH, and lipid studies.  Adjustments to his medications will be made if necessary.  If his Ferron study does not reveal significant abnormality, he will be given clearance for his knee surgery.  Otherwise I will see him in the office for follow-up evaluation.  Time spent: 25 minutes  Troy Sine, MD, [facility]  04/08/2019 9:21 AM

## 2019-04-03 LAB — COMPREHENSIVE METABOLIC PANEL
ALT: 19 IU/L (ref 0–44)
AST: 15 IU/L (ref 0–40)
Albumin/Globulin Ratio: 1.9 (ref 1.2–2.2)
Albumin: 4.4 g/dL (ref 3.8–4.8)
Alkaline Phosphatase: 111 IU/L (ref 39–117)
BUN/Creatinine Ratio: 16 (ref 10–24)
BUN: 16 mg/dL (ref 8–27)
Bilirubin Total: 0.4 mg/dL (ref 0.0–1.2)
CO2: 23 mmol/L (ref 20–29)
Calcium: 10 mg/dL (ref 8.6–10.2)
Chloride: 103 mmol/L (ref 96–106)
Creatinine, Ser: 1.03 mg/dL (ref 0.76–1.27)
GFR calc Af Amer: 85 mL/min/{1.73_m2} (ref >59)
GFR calc non Af Amer: 74 mL/min/{1.73_m2} (ref >59)
Globulin, Total: 2.3 g/dL (ref 1.5–4.5)
Glucose: 99 mg/dL (ref 65–99)
Potassium: 5.1 mmol/L (ref 3.5–5.2)
Sodium: 140 mmol/L (ref 134–144)
Total Protein: 6.7 g/dL (ref 6.0–8.5)

## 2019-04-03 LAB — TSH: TSH: 2.68 u[IU]/mL (ref 0.450–4.500)

## 2019-04-03 LAB — CBC
Hematocrit: 44.3 % (ref 37.5–51.0)
Hemoglobin: 15.3 g/dL (ref 13.0–17.7)
MCH: 30.9 pg (ref 26.6–33.0)
MCHC: 34.5 g/dL (ref 31.5–35.7)
MCV: 90 fL (ref 79–97)
Platelets: 158 10*3/uL (ref 150–450)
RBC: 4.95 x10E6/uL (ref 4.14–5.80)
RDW: 12.8 % (ref 11.6–15.4)
WBC: 8.3 10*3/uL (ref 3.4–10.8)

## 2019-04-03 LAB — HEMOGLOBIN A1C
Est. average glucose Bld gHb Est-mCnc: 126 mg/dL
Hgb A1c MFr Bld: 6 % — ABNORMAL HIGH (ref 4.8–5.6)

## 2019-04-03 LAB — LIPID PANEL
Chol/HDL Ratio: 4.2 ratio (ref 0.0–5.0)
Cholesterol, Total: 135 mg/dL (ref 100–199)
HDL: 32 mg/dL — ABNORMAL LOW (ref >39)
LDL Chol Calc (NIH): 82 mg/dL (ref 0–99)
Triglycerides: 114 mg/dL (ref 0–149)
VLDL Cholesterol Cal: 21 mg/dL (ref 5–40)

## 2019-04-03 LAB — HEPATIC FUNCTION PANEL: Bilirubin, Direct: 0.12 mg/dL (ref 0.00–0.40)

## 2019-04-04 ENCOUNTER — Other Ambulatory Visit: Payer: Self-pay | Admitting: Cardiovascular Disease

## 2019-04-04 DIAGNOSIS — I739 Peripheral vascular disease, unspecified: Secondary | ICD-10-CM

## 2019-04-06 ENCOUNTER — Telehealth (HOSPITAL_COMMUNITY): Payer: Self-pay

## 2019-04-06 NOTE — Telephone Encounter (Signed)
Encounter complete. 

## 2019-04-08 ENCOUNTER — Encounter: Payer: Self-pay | Admitting: Cardiovascular Disease

## 2019-04-09 NOTE — H&P (Signed)
TOTAL KNEE ADMISSION H&P  Patient is being admitted for right total knee arthroplasty.  Subjective:  Chief Complaint:  Right knee primary OA / pain  HPI: Renee Ramus, 68 y.o. male, has a history of pain and functional disability in the right knee due to arthritis and has failed non-surgical conservative treatments for greater than 12 weeks to includeNSAID's and/or analgesics, corticosteriod injections, viscosupplementation injections and activity modification.  Onset of symptoms was gradual, starting 3 years ago with gradually worsening course since that time. The patient noted prior procedures on the knee to include  arthroscopy on the right knee(s).  Patient currently rates pain in the right knee(s) at 10 out of 10 with activity. Patient has night pain, worsening of pain with activity and weight bearing, pain that interferes with activities of daily living, pain with passive range of motion, crepitus and joint swelling.  Patient has evidence of periarticular osteophytes and joint space narrowing by imaging studies.  There is no active infection.  Risks, benefits and expectations were discussed with the patient.  Risks including but not limited to the risk of anesthesia, blood clots, nerve damage, blood vessel damage, failure of the prosthesis, infection and up to and including death.  Patient understand the risks, benefits and expectations and wishes to proceed with surgery.   PCP: Coy Saunas, MD  D/C Plans:       Home   Post-op Meds:       No Rx given   Tranexamic Acid:      To be given - IV   Decadron:      Is to be given  FYI:      Plavix & ASA  Norco  DME:   Pt equipment Rxs sent - RW & 3-n-1  PT:   OPPT   [facility]: [facility]    Patient Active Problem List   Diagnosis Date Noted  . CAD (coronary artery disease) of artery bypass graft 03/10/2015  . Hypoxia 04/27/2014  . Acute non-ST segment elevation myocardial infarction ([place]) 04/27/2014  . Chest wall mass  02/15/2014  . Nicotine addiction 06/04/2013  . Low back pain 03/16/2013  . Atherosclerotic heart disease of native coronary artery with unstable angina pectoris ([place]) 11/24/2012  . Old inferior wall myocardial infarction 11/24/2012  . Hyperlipidemia with target LDL less than 70 11/24/2012  . Essential hypertension 11/24/2012  . Tobacco use 11/24/2012  . GERD (gastroesophageal reflux disease) 11/24/2012  . Obesity (BMI 30.0-34.9) 11/24/2012  . Arteriosclerosis of coronary artery 11/07/2012  . HLD (hyperlipidemia) 12/08/2010  . Benign hypertension 12/08/2010   Past Medical History:  Diagnosis Date  . Arthritis    RA  . Broken neck ([place]) 2008   fall from ladder  . CAD (coronary artery disease)    a. s/p CABG in 2009 with LIMA-LAD, free RIMA-OM1, and seq SVG-PDA-PLA b. s/p PTCA of 99% stenosis along PDA in 2011 c. cath in 2015 showing severe native CAD with patent grafts except known occlusion of sequential limb of the SVG-PLA  . GERD (gastroesophageal reflux disease)   . History of Doppler ultrasound 04/13/2006   LEAs; bilat ABIs - no evidence of arterial insuff.; bilat PVRs - normal; irregular non-hemodynamically significant plaque bilat in SFA  . History of echocardiogram 10/29/2009   EF [id number]; LV systolic function normal; mildly sclerotic AV  . Hyperlipidemia   . Hypertension   . Myocardial infarction [facility]) 04/2014    Past Surgical History:  Procedure Laterality Date  . CARDIAC  CATHETERIZATION  10/24/2009   r/t CP; significant native CAD, patent LIMA graft to LAD & patent RIMA to obtuse marginal vessel; graft to RCA 99% stenosed at anastomses; stenting to RCA reduced to 20-30%  . CARDIAC CATHETERIZATION  04/30/2014  . CARPAL TUNNEL RELEASE  2000  . CORONARY ARTERY BYPASS GRAFT  2009   revascularization by Dr. Koleen Nimrod; LIMA to LAD, free RIMA graft to OM1, sequential vein gradt to PDA & PL segment  . KNEE SURGERY     Left Knee (1992), Right Knee (1998);   Marland Kitchen LEFT HEART  CATHETERIZATION WITH CORONARY/GRAFT ANGIOGRAM N/A 11/29/2012   Procedure: LEFT HEART CATHETERIZATION WITH Beatrix Fetters;  Surgeon: Troy Sine, MD;  Location: [facility] CATH LAB;  Service: Cardiovascular;  Laterality: N/A;  . LEFT HEART CATHETERIZATION WITH CORONARY/GRAFT ANGIOGRAM N/A 04/30/2014   Procedure: LEFT HEART CATHETERIZATION WITH Beatrix Fetters;  Surgeon: Sinclair Grooms, MD;  Location: [facility] CATH LAB;  Service: Cardiovascular;  Laterality: N/A;  . LUMBAR SPINE SURGERY  2004   s/p fusion L4-S1; 4 back surgeries  . neck fusion  1999  . PARATHYROIDECTOMY  2001  . SKIN CANCER EXCISION  1999   face    No current facility-administered medications for this encounter.    Current Outpatient Medications  Medication Sig Dispense Refill Last Dose  . albuterol (PROVENTIL HFA;VENTOLIN HFA) 108 (90 BASE) MCG/ACT inhaler Inhale 2 puffs into the lungs as directed.   Taking  . amLODipine (NORVASC) 10 MG tablet Take 1 tablet (10 mg total) by mouth daily. KEEP OV. 90 tablet 2 Taking  . aspirin EC 81 MG tablet Take 81 mg by mouth daily.   Taking  . atorvastatin (LIPITOR) 40 MG tablet TAKE 1 TABLET BY MOUTH ONCE DAILY 90 tablet 3 Taking  . clopidogrel (PLAVIX) 75 MG tablet Take 1 tablet (75 mg total) by mouth daily. 90 tablet 3 Taking  . docusate sodium (COLACE) 100 MG capsule Take 100 mg by mouth daily.   Taking  . HYDROcodone-acetaminophen (NORCO/VICODIN) 5-325 MG per tablet Take 1-2 tablets by mouth as needed.   Taking  . isosorbide mononitrate (IMDUR) 120 MG 24 hr tablet Take 1 tablet (120 mg total) by mouth daily. 90 tablet 3 Taking  . lisinopril (PRINIVIL,ZESTRIL) 20 MG tablet TAKE 1 TABLET BY MOUTH ONCE DAILY 90 tablet 3 Taking  . Magnesium 100 MG CAPS Take 1 tablet by mouth daily.   Taking  . metoprolol tartrate (LOPRESSOR) 25 MG tablet Take 1 tablet (25 mg total) by mouth 2 (two) times daily. 180 tablet 3 Taking  . nitroGLYCERIN (NITROSTAT) 0.4 MG SL tablet Place 0.4 mg under  the tongue every 5 (five) minutes as needed for chest pain.   Taking  . pantoprazole (PROTONIX) 40 MG tablet Take 40 mg by mouth daily.    Taking  . Potassium 99 MG TABS Take 1 tablet by mouth daily.   Taking   No Known Allergies   Social History   Tobacco Use  . Smoking status: Current Every Day Smoker    Years: 30.00    Types: Cigarettes  . Smokeless tobacco: Never Used  . Tobacco comment: 3-4 cigarettes per day  Substance Use Topics  . Alcohol use: No    Family History  Problem Relation Age of Onset  . Heart disease Mother   . Heart attack Mother   . Hyperlipidemia Mother   . Stroke Father   . Heart disease Father   . Hyperlipidemia Sister  Review of Systems  Constitutional: Negative.   HENT: Negative.   Eyes: Negative.   Respiratory: Negative.   Cardiovascular: Negative.   Gastrointestinal: Positive for heartburn.  Genitourinary: Negative.   Musculoskeletal: Positive for joint pain.  Skin: Negative.   Neurological: Positive for headaches.  Endo/Heme/Allergies: Negative.   Psychiatric/Behavioral: Negative.     Objective:  Physical Exam  Constitutional: He is oriented to person, place, and time. He appears well-developed.  HENT:  Head: Normocephalic.  Eyes: Pupils are equal, round, and reactive to light.  Neck: Neck supple. No JVD present. No tracheal deviation present. No thyromegaly present.  Cardiovascular: Normal rate, regular rhythm and intact distal pulses.  Respiratory: Effort normal and breath sounds normal. No respiratory distress. He has no wheezes.  GI: Soft. There is no abdominal tenderness. There is no guarding.  Musculoskeletal:     Right knee: He exhibits decreased range of motion, swelling and bony tenderness. He exhibits no ecchymosis, no deformity, no laceration and no erythema. Tenderness found.  Lymphadenopathy:    He has no cervical adenopathy.  Neurological: He is alert and oriented to person, place, and time.  Skin: Skin is warm  and dry.  Psychiatric: He has a normal mood and affect.      Labs:  Estimated body mass index is 26.92 kg/m as calculated from the following:   Height as of 04/02/19: 5\' 2"  (1.575 m).   Weight as of 04/02/19: 66.8 kg.   Imaging Review Plain radiographs demonstrate severe degenerative joint disease of the right knee.  The bone quality appears to be good for age and reported activity level.      Assessment/Plan:  End stage arthritis, right knee   The patient history, physical examination, clinical judgment of the provider and imaging studies are consistent with end stage degenerative joint disease of the right knee(s) and total knee arthroplasty is deemed medically necessary. The treatment options including medical management, injection therapy arthroscopy and arthroplasty were discussed at length. The risks and benefits of total knee arthroplasty were presented and reviewed. The risks due to aseptic loosening, infection, stiffness, patella tracking problems, thromboembolic complications and other imponderables were discussed. The patient acknowledged the explanation, agreed to proceed with the plan and consent was signed. Patient is being admitted for inpatient treatment for surgery, pain control, PT, OT, prophylactic antibiotics, VTE prophylaxis, progressive ambulation and ADL's and discharge planning. The patient is planning to be discharged home.     Patient's anticipated LOS is less than 2 midnights, meeting these requirements: - Lives within 1 hour of care - Has a competent adult at home to recover with post-op recover - NO history of  - Chronic pain requiring opiods  - Diabetes  - Heart failure  - Stroke  - DVT/VTE  - Cardiac arrhythmia  - Respiratory Failure/COPD  - Renal failure  - Anemia  - Advanced Liver disease    West Pugh. Dorismar Chay   PA-C  04/09/2019, 12:12 PM

## 2019-04-11 ENCOUNTER — Other Ambulatory Visit: Payer: Self-pay

## 2019-04-11 ENCOUNTER — Ambulatory Visit (HOSPITAL_COMMUNITY)
Admission: RE | Admit: 2019-04-11 | Discharge: 2019-04-11 | Disposition: A | Discharge status: Home / Self Care | Payer: Medicare Other | Source: Ambulatory Visit | Attending: Cardiovascular Disease | Admitting: Cardiovascular Disease

## 2019-04-11 ENCOUNTER — Ambulatory Visit (HOSPITAL_BASED_OUTPATIENT_CLINIC_OR_DEPARTMENT_OTHER)
Admission: RE | Admit: 2019-04-11 | Discharge: 2019-04-11 | Disposition: A | Discharge status: Home / Self Care | Payer: Medicare Other | Source: Ambulatory Visit | Attending: Cardiovascular Disease | Admitting: Cardiovascular Disease

## 2019-04-11 DIAGNOSIS — J449 Chronic obstructive pulmonary disease, unspecified: Secondary | ICD-10-CM | POA: Insufficient documentation

## 2019-04-11 DIAGNOSIS — R001 Bradycardia, unspecified: Secondary | ICD-10-CM | POA: Insufficient documentation

## 2019-04-11 DIAGNOSIS — R0609 Other forms of dyspnea: Secondary | ICD-10-CM | POA: Diagnosis not present

## 2019-04-11 DIAGNOSIS — I252 Old myocardial infarction: Secondary | ICD-10-CM | POA: Insufficient documentation

## 2019-04-11 DIAGNOSIS — I10 Essential (primary) hypertension: Secondary | ICD-10-CM | POA: Insufficient documentation

## 2019-04-11 DIAGNOSIS — R079 Chest pain, unspecified: Secondary | ICD-10-CM | POA: Insufficient documentation

## 2019-04-11 DIAGNOSIS — Z01810 Encounter for preprocedural cardiovascular examination: Secondary | ICD-10-CM | POA: Diagnosis not present

## 2019-04-11 DIAGNOSIS — K219 Gastro-esophageal reflux disease without esophagitis: Secondary | ICD-10-CM | POA: Diagnosis not present

## 2019-04-11 DIAGNOSIS — F17219 Nicotine dependence, cigarettes, with unspecified nicotine-induced disorders: Secondary | ICD-10-CM | POA: Insufficient documentation

## 2019-04-11 DIAGNOSIS — Z8249 Family history of ischemic heart disease and other diseases of the circulatory system: Secondary | ICD-10-CM | POA: Diagnosis not present

## 2019-04-11 DIAGNOSIS — Z951 Presence of aortocoronary bypass graft: Secondary | ICD-10-CM | POA: Insufficient documentation

## 2019-04-11 DIAGNOSIS — I739 Peripheral vascular disease, unspecified: Secondary | ICD-10-CM | POA: Diagnosis present

## 2019-04-11 DIAGNOSIS — I2510 Atherosclerotic heart disease of native coronary artery without angina pectoris: Secondary | ICD-10-CM | POA: Insufficient documentation

## 2019-04-11 DIAGNOSIS — Z8673 Personal history of transient ischemic attack (TIA), and cerebral infarction without residual deficits: Secondary | ICD-10-CM | POA: Diagnosis not present

## 2019-04-11 LAB — MYOCARDIAL PERFUSION IMAGING
LV dias vol: 121 mL (ref 62–150)
LV sys vol: 60 mL
Peak HR: 76 {beats}/min
Rest HR: 52 {beats}/min
SDS: 3
SRS: 1
SSS: 4
TID: 1.13

## 2019-04-11 MED ORDER — TECHNETIUM TC 99M TETROFOSMIN IV KIT
10.9000 | PACK | Freq: Once | INTRAVENOUS | Status: AC | PRN
  Administered 2019-04-11: 10.9 via INTRAVENOUS
  Filled 2019-04-11: qty 11

## 2019-04-11 MED ORDER — REGADENOSON 0.4 MG/5ML IV SOLN
0.4000 mg | Freq: Once | INTRAVENOUS | Status: AC
  Administered 2019-04-11: 14:00:00 0.4 mg via INTRAVENOUS

## 2019-04-11 MED ORDER — TECHNETIUM TC 99M TETROFOSMIN IV KIT
30.9000 | PACK | Freq: Once | INTRAVENOUS | Status: AC | PRN
  Administered 2019-04-11: 30.9 via INTRAVENOUS
  Filled 2019-04-11: qty 31

## 2019-04-16 ENCOUNTER — Other Ambulatory Visit: Payer: Self-pay | Admitting: Physician Assistant

## 2019-04-23 ENCOUNTER — Other Ambulatory Visit: Payer: Self-pay

## 2019-04-23 MED ORDER — METOPROLOL TARTRATE 25 MG PO TABS: 25.0000 mg | ORAL_TABLET | Freq: Two times a day (BID) | ORAL | 3 refills | Status: DC

## 2019-04-27 ENCOUNTER — Encounter (HOSPITAL_COMMUNITY): Payer: Self-pay

## 2019-04-27 NOTE — Patient Instructions (Addendum)
DUE TO COVID-19 ONLY ONE VISITOR IS ALLOWED TO COME WITH YOU AND STAY IN THE WAITING ROOM ONLY DURING PRE OP AND PROCEDURE. THE ONE VISITOR MAY VISIT WITH YOU IN YOUR PRIVATE ROOM DURING VISITING HOURS ONLY!!   COVID SWAB TESTING MUST BE COMPLETED ON:   Today, Immediately after pre op appointment   [address], [place] [facility] enter pre surgical testing line (Must self quarantine after testing. Follow instructions on handout.)             Your procedure is scheduled on: Thursday, Dec. 17, 2020   Report to [facility] Main  Entrance    Report to admitting at 7:35 AM   Call this number if you have problems the morning of surgery [phone]   Do not eat food:After Midnight.   May have liquids until 7:05 AM day of surgery   CLEAR LIQUID DIET  Foods Allowed                                                                     Foods Excluded  Water, Black Coffee and tea, regular and decaf                             liquids that you cannot  Plain Jell-O in any flavor  (No red)                                           see through such as: Fruit ices (not with fruit pulp)                                     milk, soups, orange juice  Iced Popsicles (No red)                                    All solid food Carbonated beverages, regular and diet                                    Apple juices Sports drinks like Gatorade (No red) Lightly seasoned clear broth or consume(fat free) Sugar, honey syrup  Sample Menu Breakfast                                Lunch                                     Supper Cranberry juice                    Beef broth                            Chicken broth Jell-O  Grape juice                           Apple juice Coffee or tea                        Jell-O                                      Popsicle                                                Coffee or tea                         Coffee or tea   Complete one G2 drink the morning of surgery at 7:00AM the day of surgery.   Brush your teeth the morning of surgery.   Do NOT smoke after Midnight   Take these medicines the morning of surgery with A SIP OF WATER: Amlodipine, Atorvastatin, Isosorbide, Metoprolol, Pantoprazole (Take the ones listed if you normally take them in the morning time, otherwise take them per your normal routine)   Bring Asthma Inhaler day of surgery                               You may not have any metal on your body including  jewelry, and body piercings             Do not wear lotions, powders, perfumes/cologne, or deodorant                           Men may shave face and neck.   Do not bring valuables to the hospital. [place].   Contacts, dentures or bridgework may not be worn into surgery.   Bring small overnight bag day of surgery.    Patients discharged the day of surgery will not be allowed to drive home.   Special Instructions: Bring a copy of your healthcare power of attorney and living will documents         the day of surgery if you haven't scanned them in before.              Please read over the following fact sheets you were given:  [facility] - Preparing for Surgery Before surgery, you can play an important role.  Because skin is not sterile, your skin needs to be as free of germs as possible.  You can reduce the number of germs on your skin by washing with CHG (chlorahexidine gluconate) soap before surgery.  CHG is an antiseptic cleaner which kills germs and bonds with the skin to continue killing germs even after washing. Please DO NOT use if you have an allergy to CHG or antibacterial soaps.  If your skin becomes reddened/irritated stop using the CHG and inform your nurse when you arrive at Short Stay. Do not shave (including legs and underarms) for at least 48 hours prior to the first CHG shower.  You may shave your  face/neck.  Please follow these instructions carefully:  1.  Shower with CHG Soap the night before surgery and the  morning of surgery.  2.  If you choose to wash your hair, wash your hair first as usual with your normal  shampoo.  3.  After you shampoo, rinse your hair and body thoroughly to remove the shampoo.                             4.  Use CHG as you would any other liquid soap.  You can apply chg directly to the skin and wash.  Gently with a scrungie or clean washcloth.  5.  Apply the CHG Soap to your body ONLY FROM THE NECK DOWN.   Do   not use on face/ open                           Wound or open sores. Avoid contact with eyes, ears mouth and   genitals (private parts).                       Wash face,  Genitals (private parts) with your normal soap.             6.  Wash thoroughly, paying special attention to the area where your    surgery  will be performed.  7.  Thoroughly rinse your body with warm water from the neck down.  8.  DO NOT shower/wash with your normal soap after using and rinsing off the CHG Soap.                9.  Pat yourself dry with a clean towel.            10.  Wear clean pajamas.            11.  Place clean sheets on your bed the night of your first shower and do not  sleep with pets. Day of Surgery : Do not apply any lotions/deodorants the morning of surgery.  Please wear clean clothes to the hospital/surgery center.  FAILURE TO FOLLOW THESE INSTRUCTIONS MAY RESULT IN THE CANCELLATION OF YOUR SURGERY  PATIENT SIGNATURE_________________________________  NURSE SIGNATURE__________________________________  ________________________________________________________________________   Robert Melton  An incentive spirometer is a tool that can help keep your lungs clear and active. This tool measures how well you are filling your lungs with each breath. Taking long deep breaths may help reverse or decrease the chance of developing breathing (pulmonary)  problems (especially infection) following:  A long period of time when you are unable to move or be active. BEFORE THE PROCEDURE   If the spirometer includes an indicator to show your best effort, your nurse or respiratory therapist will set it to a desired goal.  If possible, sit up straight or lean slightly forward. Try not to slouch.  Hold the incentive spirometer in an upright position. INSTRUCTIONS FOR USE  1. Sit on the edge of your bed if possible, or sit up as far as you can in bed or on a chair. 2. Hold the incentive spirometer in an upright position. 3. Breathe out normally. 4. Place the mouthpiece in your mouth and seal your lips tightly around it. 5. Breathe in slowly and as deeply as possible, raising the piston or the ball toward the top of the column. 6. Hold  your breath for 3-5 seconds or for as long as possible. Allow the piston or ball to fall to the bottom of the column. 7. Remove the mouthpiece from your mouth and breathe out normally. 8. Rest for a few seconds and repeat Steps 1 through 7 at least 10 times every 1-2 hours when you are awake. Take your time and take a few normal breaths between deep breaths. 9. The spirometer may include an indicator to show your best effort. Use the indicator as a goal to work toward during each repetition. 10. After each set of 10 deep breaths, practice coughing to be sure your lungs are clear. If you have an incision (the cut made at the time of surgery), support your incision when coughing by placing a pillow or rolled up towels firmly against it. Once you are able to get out of bed, walk around indoors and cough well. You may stop using the incentive spirometer when instructed by your caregiver.  RISKS AND COMPLICATIONS  Take your time so you do not get dizzy or light-headed.  If you are in pain, you may need to take or ask for pain medication before doing incentive spirometry. It is harder to take a deep breath if you are having  pain. AFTER USE  Rest and breathe slowly and easily.  It can be helpful to keep track of a log of your progress. Your caregiver can provide you with a simple table to help with this. If you are using the spirometer at home, follow these instructions: [place] IF:   You are having difficultly using the spirometer.  You have trouble using the spirometer as often as instructed.  Your pain medication is not giving enough relief while using the spirometer.  You develop fever of 100.5 F (38.1 C) or higher. SEEK IMMEDIATE MEDICAL CARE IF:   You cough up bloody sputum that had not been present before.  You develop fever of 102 F (38.9 C) or greater.  You develop worsening pain at or near the incision site. MAKE SURE YOU:   Understand these instructions.  Will watch your condition.  Will get help right away if you are not doing well or get worse. Document Released: 09/13/2006 Document Revised: 07/26/2011 Document Reviewed: 11/14/2006 ExitCare Patient Information 2014 Exit[employer], [place].   ________________________________________________________________________  WHAT IS A BLOOD TRANSFUSION? Blood Transfusion Information  A transfusion is the replacement of blood or some of its parts. Blood is made up of multiple cells which provide different functions.  Red blood cells carry oxygen and are used for blood loss replacement.  White blood cells fight against infection.  Platelets control bleeding.  Plasma helps clot blood.  Other blood products are available for specialized needs, such as hemophilia or other clotting disorders. BEFORE THE TRANSFUSION  Who gives blood for transfusions?   Healthy volunteers who are fully evaluated to make sure their blood is safe. This is blood bank blood. Transfusion therapy is the safest it has ever been in the practice of medicine. Before blood is taken from a donor, a complete history is taken to make sure that person has no history  of diseases nor engages in risky social behavior (examples are intravenous drug use or sexual activity with multiple partners). The donor's travel history is screened to minimize risk of transmitting infections, such as malaria. The donated blood is tested for signs of infectious diseases, such as HIV and hepatitis. The blood is then tested to be sure it is compatible  with you in order to minimize the chance of a transfusion reaction. If you or a relative donates blood, this is often done in anticipation of surgery and is not appropriate for emergency situations. It takes many days to process the donated blood. RISKS AND COMPLICATIONS Although transfusion therapy is very safe and saves many lives, the main dangers of transfusion include:   Getting an infectious disease.  Developing a transfusion reaction. This is an allergic reaction to something in the blood you were given. Every precaution is taken to prevent this. The decision to have a blood transfusion has been considered carefully by your caregiver before blood is given. Blood is not given unless the benefits outweigh the risks. AFTER THE TRANSFUSION  Right after receiving a blood transfusion, you will usually feel much better and more energetic. This is especially true if your red blood cells have gotten low (anemic). The transfusion raises the level of the red blood cells which carry oxygen, and this usually causes an energy increase.  The nurse administering the transfusion will monitor you carefully for complications. HOME CARE INSTRUCTIONS  No special instructions are needed after a transfusion. You may find your energy is better. Speak with your caregiver about any limitations on activity for underlying diseases you may have. SEEK MEDICAL CARE IF:   Your condition is not improving after your transfusion.  You develop redness or irritation at the intravenous (IV) site. SEEK IMMEDIATE MEDICAL CARE IF:  Any of the following symptoms  occur over the next 12 hours:  Shaking chills.  You have a temperature by mouth above 102 F (38.9 C), not controlled by medicine.  Chest, back, or muscle pain.  People around you feel you are not acting correctly or are confused.  Shortness of breath or difficulty breathing.  Dizziness and fainting.  You get a rash or develop hives.  You have a decrease in urine output.  Your urine turns a dark color or changes to pink, red, or brown. Any of the following symptoms occur over the next 10 days:  You have a temperature by mouth above 102 F (38.9 C), not controlled by medicine.  Shortness of breath.  Weakness after normal activity.  The white part of the eye turns yellow (jaundice).  You have a decrease in the amount of urine or are urinating less often.  Your urine turns a dark color or changes to pink, red, or brown. Document Released: 04/30/2000 Document Revised: 07/26/2011 Document Reviewed: 12/18/2007 [facility] Patient Information 2014 [place], [place].  _______________________________________________________________________

## 2019-04-30 ENCOUNTER — Other Ambulatory Visit: Payer: Self-pay

## 2019-04-30 ENCOUNTER — Encounter (HOSPITAL_COMMUNITY): Payer: Self-pay

## 2019-04-30 ENCOUNTER — Encounter (HOSPITAL_COMMUNITY)
Admission: RE | Admit: 2019-04-30 | Discharge: 2019-04-30 | Disposition: A | Discharge status: Home / Self Care | Payer: Medicare Other | Source: Ambulatory Visit | Attending: Orthopedic Surgery | Admitting: Orthopedic Surgery

## 2019-04-30 ENCOUNTER — Other Ambulatory Visit (HOSPITAL_COMMUNITY)
Admission: RE | Admit: 2019-04-30 | Discharge: 2019-04-30 | Disposition: A | Discharge status: Home / Self Care | Payer: Medicare Other | Source: Ambulatory Visit | Attending: Orthopedic Surgery | Admitting: Orthopedic Surgery

## 2019-04-30 DIAGNOSIS — M1711 Unilateral primary osteoarthritis, right knee: Secondary | ICD-10-CM | POA: Insufficient documentation

## 2019-04-30 DIAGNOSIS — Z01812 Encounter for preprocedural laboratory examination: Secondary | ICD-10-CM | POA: Insufficient documentation

## 2019-04-30 DIAGNOSIS — Z20828 Contact with and (suspected) exposure to other viral communicable diseases: Secondary | ICD-10-CM | POA: Diagnosis not present

## 2019-04-30 HISTORY — DX: Chronic obstructive pulmonary disease, unspecified: J44.9

## 2019-04-30 HISTORY — DX: Personal history of other diseases of the nervous system and sense organs: Z86.69

## 2019-04-30 HISTORY — DX: Opioid use, unspecified, uncomplicated: F11.90

## 2019-04-30 HISTORY — DX: Hyperglycemia, unspecified: R73.9

## 2019-04-30 HISTORY — DX: Unspecified malignant neoplasm of skin, unspecified: C44.90

## 2019-04-30 LAB — SURGICAL PCR SCREEN
MRSA, PCR: NEGATIVE
Staphylococcus aureus: NEGATIVE

## 2019-04-30 LAB — CBC
HCT: 46.2 % (ref 39.0–52.0)
Hemoglobin: 15.1 g/dL (ref 13.0–17.0)
MCH: 30.8 pg (ref 26.0–34.0)
MCHC: 32.7 g/dL (ref 30.0–36.0)
MCV: 94.1 fL (ref 80.0–100.0)
Platelets: 203 10*3/uL (ref 150–400)
RBC: 4.91 MIL/uL (ref 4.22–5.81)
RDW: 13.6 % (ref 11.5–15.5)
WBC: 10 10*3/uL (ref 4.0–10.5)
nRBC: 0 % (ref 0.0–0.2)

## 2019-04-30 LAB — BASIC METABOLIC PANEL
Anion gap: 10 (ref 5–15)
BUN: 24 mg/dL — ABNORMAL HIGH (ref 8–23)
CO2: 22 mmol/L (ref 22–32)
Calcium: 9.5 mg/dL (ref 8.9–10.3)
Chloride: 105 mmol/L (ref 98–111)
Creatinine, Ser: 1.09 mg/dL (ref 0.61–1.24)
GFR calc Af Amer: >60 mL/min (ref >60)
GFR calc non Af Amer: >60 mL/min (ref >60)
Glucose, Bld: 99 mg/dL (ref 70–99)
Potassium: 5.2 mmol/L — ABNORMAL HIGH (ref 3.5–5.1)
Sodium: 137 mmol/L (ref 135–145)

## 2019-04-30 LAB — ABO/RH: ABO/RH(D): A POS

## 2019-04-30 NOTE — Progress Notes (Signed)
PCP - Dr. Thea Alken Last office visit 04/24/2019 in epic Cardiologist - Dr. Westley Foots last office visit 04/02/2019 in epic  Chest x-ray - greater than 1 year EKG - 04/02/2019 in epic Stress Test - 04/11/2019 in epic ECHO - greater than 2 years Cardiac Cath - greater than 2 years  Sleep Study - N/A CPAP - N/A  Fasting Blood Sugar - N/A Checks Blood Sugar __N/A___ times a day  Blood Thinner Instructions:  Plavix last dose 04/25/2019 Aspirin Instructions: Yes Last Dose: 04/25/2019  Anesthesia review: CAD, MI, HTN, CABG, COPD  Patient denies shortness of breath, fever, cough and chest pain at PAT appointment   Patient verbalized understanding of instructions that were given to them at the PAT appointment. Patient was also instructed that they will need to review over the PAT instructions again at home before surgery.

## 2019-05-01 LAB — NOVEL CORONAVIRUS, NAA (HOSP ORDER, SEND-OUT TO REF LAB; TAT 18-24 HRS): SARS-CoV-2, NAA: NOT DETECTED

## 2019-05-01 NOTE — Progress Notes (Signed)
Anesthesia Chart Review   Case: [id number] Date/Time: 05/03/19 0950   Procedure: TOTAL KNEE ARTHROPLASTY (Right Knee) - 70 mins   Anesthesia type: Spinal   Pre-op diagnosis: Right knee osteoarthritis   Location: [facility] ROOM 09 / [facility]   Surgeons: Paralee Cancel, MD      DISCUSSION:68 y.o. current every day smoker (15 pack years) with h/o CAD (CABG 2009), HTN, GERD, HLD, COPD, right knee OA scheduled for above procedure 05/03/2019 with Dr. Paralee Cancel.   Pt last seen by cardiologist, Dr. Shelva Majestic, 04/02/2019.  Per OV note, " have recommended he undergo a preoperative Lexiscan Myoview study to make sure there is no significant ischemia though I anticipate that he most likely will have ischemia in the posterolateral territory.  In addition, with his extensive smoking history in the concurrence of PVD associated with CAD, I have suggested he undergo lower extremity arterial Doppler studies to assess for peripheral vascular disease particularly with his hip discomfort with walking.  At present, his blood pressure is controlled on amlodipine 10 mg, isosorbide 120 mg, lisinopril 20 mg in addition to metoprolol tartrate 12.5 mg twice a day.  He continues to be on atorvastatin 40 mg and over-the-counter fish oil for hyperlipidemia.  He continues to take Protonix for GERD.  I am recommending fasting laboratory including comprehensive metabolic panel, CBC, TSH, and lipid studies.  Adjustments to his medications will be made if necessary.  If his [place] study does not reveal significant abnormality, he will be given clearance for his knee surgery.  Otherwise I will see him in the office for follow-up evaluation."    Lexiscan 04/11/2019 low risk study.  Normal lower extremity arterial doppler study with normal ABIs bilaterally 04/11/2019.   Anticipate pt can proceed with planned procedure barring acute status change.   VS: BP 136/61 (BP Location: Right Arm)   Pulse 60   Temp 36.8 C (Oral)   Resp 17    Ht 5\' 3"  (1.6 m)   Wt 78.6 kg   SpO2 100%   BMI 30.68 kg/m   PROVIDERS: Coy Saunas, MD is PCP   Shelva Majestic, MD is Cardiologist  LABS: Labs reviewed: Acceptable for surgery. (all labs ordered are listed, but only abnormal results are displayed)  Labs Reviewed  BASIC METABOLIC PANEL - Abnormal; Notable for the following components:      Result Value   Potassium 5.2 (*)    BUN 24 (*)    All other components within normal limits  SURGICAL PCR SCREEN  CBC  TYPE AND SCREEN  ABO/RH     IMAGES:   EKG: 04/02/2019 Rate 63 bpm Normal sinus rhythm  Possible left atrial enlargement   CV: Stress Test 04/11/2019  The left ventricular ejection fraction is mildly decreased (45-54%).  Nuclear stress EF: 51%.  This is a low risk study.  Defect 1: There is a small defect of mild severity present in the apical inferior and apex location.  Findings consistent with a small region of ischemia in the distal RCA territory. Past Medical History:  Diagnosis Date  . Arthritis    RA  . Broken neck ([place]) 2008   fall from ladder  . CAD (coronary artery disease)    a. s/p CABG in 2009 with LIMA-LAD, free RIMA-OM1, and seq SVG-PDA-PLA b. s/p PTCA of 99% stenosis along PDA in 2011 c. cath in 2015 showing severe native CAD with patent grafts except known occlusion of sequential limb of the SVG-PLA  .  Chronic, continuous use of opioids   . COPD (chronic obstructive pulmonary disease) ([place])   . GERD (gastroesophageal reflux disease)   . History of carpal tunnel syndrome    Bilateral  . History of Doppler ultrasound 04/13/2006   LEAs; bilat ABIs - no evidence of arterial insuff.; bilat PVRs - normal; irregular non-hemodynamically significant plaque bilat in SFA  . History of echocardiogram 10/29/2009   EF [id number]; LV systolic function normal; mildly sclerotic AV  . Hyperglycemia   . Hyperlipidemia   . Hypertension   . Myocardial infarction ([place]) 04/2014  . Skin cancer    face     Past Surgical History:  Procedure Laterality Date  . CARDIAC CATHETERIZATION  10/24/2009   r/t CP; significant native CAD, patent LIMA graft to LAD & patent RIMA to obtuse marginal vessel; graft to RCA 99% stenosed at anastomses; stenting to RCA reduced to 20-30%  . CARDIAC CATHETERIZATION  04/30/2014  . CARPAL TUNNEL RELEASE  2000  . COLONOSCOPY    . CORONARY ANGIOPLASTY    . CORONARY ARTERY BYPASS GRAFT  2009   revascularization by Dr. Koleen Nimrod; LIMA to LAD, free RIMA graft to OM1, sequential vein gradt to PDA & PL segment  . KNEE SURGERY     Left Knee (1992), Right Knee (1998);   Marland Kitchen LEFT HEART CATHETERIZATION WITH CORONARY/GRAFT ANGIOGRAM N/A 11/29/2012   Procedure: LEFT HEART CATHETERIZATION WITH Beatrix Fetters;  Surgeon: Troy Sine, MD;  Location: [facility] CATH LAB;  Service: Cardiovascular;  Laterality: N/A;  . LEFT HEART CATHETERIZATION WITH CORONARY/GRAFT ANGIOGRAM N/A 04/30/2014   Procedure: LEFT HEART CATHETERIZATION WITH Beatrix Fetters;  Surgeon: Sinclair Grooms, MD;  Location: [facility] CATH LAB;  Service: Cardiovascular;  Laterality: N/A;  . LUMBAR SPINE SURGERY  2004   s/p fusion L4-S1; 4 back surgeries  . neck fusion  1999  . PARATHYROIDECTOMY  2001  . SKIN CANCER EXCISION  1999   face    MEDICATIONS: . acetaminophen (TYLENOL) 650 MG CR tablet  . albuterol (PROVENTIL HFA;VENTOLIN HFA) 108 (90 BASE) MCG/ACT inhaler  . amLODipine (NORVASC) 10 MG tablet  . aspirin EC 81 MG tablet  . atorvastatin (LIPITOR) 40 MG tablet  . cholecalciferol (VITAMIN D3) 25 MCG (1000 UT) tablet  . clopidogrel (PLAVIX) 75 MG tablet  . cyclobenzaprine (FLEXERIL) 5 MG tablet  . docusate sodium (COLACE) 100 MG capsule  . gabapentin (NEURONTIN) 300 MG capsule  . isosorbide mononitrate (IMDUR) 120 MG 24 hr tablet  . lisinopril (PRINIVIL,ZESTRIL) 20 MG tablet  . Magnesium 200 MG TABS  . metoprolol tartrate (LOPRESSOR) 25 MG tablet  . nitroGLYCERIN (NITROSTAT) 0.4 MG SL tablet   . pantoprazole (PROTONIX) 40 MG tablet  . pyridOXINE (VITAMIN B-6) 100 MG tablet  . vitamin E 400 UNIT capsule   No current facility-administered medications for this encounter.     Maia Plan [facility] Pre-Surgical Testing [phone] 05/01/19  3:31 PM

## 2019-05-02 NOTE — Anesthesia Preprocedure Evaluation (Addendum)
Anesthesia Evaluation  Patient identified by MRN, date of birth, ID band Patient awake    Reviewed: Allergy & Precautions, NPO status , Patient's Chart, lab work & pertinent test results, reviewed documented beta blocker date and time   History of Anesthesia Complications Negative for: history of anesthetic complications  Airway Mallampati: II  TM Distance: >3 FB Neck ROM: Full    Dental no notable dental hx.    Pulmonary COPD, Current Smoker and Patient abstained from smoking.,    Pulmonary exam normal        Cardiovascular hypertension, Pt. on medications and Pt. on home beta blockers + CAD (on Plavix (last dose 8 days ago)), + Past MI (2015) and + CABG (2009)  Normal cardiovascular exam  Nuclear Stress 04/11/2019: EF: 51%, low risk study, small defect of mild severity present in the apical inferior and apex location, findings consistent with a small region of ischemia in the distal RCA territory   Neuro/Psych negative neurological ROS  negative psych ROS   GI/Hepatic Neg liver ROS, GERD  Medicated and Controlled,  Endo/Other  negative endocrine ROS  Renal/GU negative Renal ROS  negative genitourinary   Musculoskeletal  (+) Arthritis ,   Abdominal   Peds  Hematology negative hematology ROS (+)   Anesthesia Other Findings Day of surgery medications reviewed with patient.  Reproductive/Obstetrics negative OB ROS                           Anesthesia Physical Anesthesia Plan  ASA: III  Anesthesia Plan: Spinal   Post-op Pain Management:  Regional for Post-op pain   Induction:   PONV Risk Score and Plan: 2 and Treatment may vary due to age or medical condition, Ondansetron, Propofol infusion and Dexamethasone  Airway Management Planned: Natural Airway and Simple Face Mask  Additional Equipment: None  Intra-op Plan:   Post-operative Plan:   Informed Consent: I have reviewed the  patients History and Physical, chart, labs and discussed the procedure including the risks, benefits and alternatives for the proposed anesthesia with the patient or authorized representative who has indicated his/her understanding and acceptance.     Dental advisory given  Plan Discussed with: CRNA  Anesthesia Plan Comments:        Anesthesia Quick Evaluation

## 2019-05-03 ENCOUNTER — Encounter (HOSPITAL_COMMUNITY)
Admission: AD | Disposition: A | Discharge status: Home / Self Care | Payer: Self-pay | Source: Home / Self Care | Attending: Orthopedic Surgery

## 2019-05-03 ENCOUNTER — Observation Stay (HOSPITAL_COMMUNITY)
Admission: AD | Admit: 2019-05-03 | Discharge: 2019-05-04 | Disposition: A | Discharge status: Home / Self Care | Payer: Medicare Other | Attending: Orthopedic Surgery | Admitting: Orthopedic Surgery

## 2019-05-03 ENCOUNTER — Other Ambulatory Visit: Payer: Self-pay

## 2019-05-03 ENCOUNTER — Ambulatory Visit (HOSPITAL_COMMUNITY): Payer: Medicare Other | Admitting: Physician Assistant

## 2019-05-03 ENCOUNTER — Encounter (HOSPITAL_COMMUNITY): Payer: Self-pay | Admitting: Orthopedic Surgery

## 2019-05-03 ENCOUNTER — Ambulatory Visit (HOSPITAL_COMMUNITY): Payer: Medicare Other | Admitting: Anesthesiology

## 2019-05-03 DIAGNOSIS — I252 Old myocardial infarction: Secondary | ICD-10-CM | POA: Insufficient documentation

## 2019-05-03 DIAGNOSIS — Z85828 Personal history of other malignant neoplasm of skin: Secondary | ICD-10-CM | POA: Insufficient documentation

## 2019-05-03 DIAGNOSIS — I2510 Atherosclerotic heart disease of native coronary artery without angina pectoris: Secondary | ICD-10-CM | POA: Insufficient documentation

## 2019-05-03 DIAGNOSIS — Z79899 Other long term (current) drug therapy: Secondary | ICD-10-CM | POA: Insufficient documentation

## 2019-05-03 DIAGNOSIS — M1711 Unilateral primary osteoarthritis, right knee: Secondary | ICD-10-CM | POA: Diagnosis present

## 2019-05-03 DIAGNOSIS — Z951 Presence of aortocoronary bypass graft: Secondary | ICD-10-CM | POA: Insufficient documentation

## 2019-05-03 DIAGNOSIS — Z7982 Long term (current) use of aspirin: Secondary | ICD-10-CM | POA: Diagnosis not present

## 2019-05-03 DIAGNOSIS — Z8249 Family history of ischemic heart disease and other diseases of the circulatory system: Secondary | ICD-10-CM | POA: Diagnosis not present

## 2019-05-03 DIAGNOSIS — F17210 Nicotine dependence, cigarettes, uncomplicated: Secondary | ICD-10-CM | POA: Diagnosis not present

## 2019-05-03 DIAGNOSIS — Z7902 Long term (current) use of antithrombotics/antiplatelets: Secondary | ICD-10-CM | POA: Diagnosis not present

## 2019-05-03 DIAGNOSIS — I10 Essential (primary) hypertension: Secondary | ICD-10-CM | POA: Diagnosis not present

## 2019-05-03 DIAGNOSIS — K219 Gastro-esophageal reflux disease without esophagitis: Secondary | ICD-10-CM | POA: Diagnosis not present

## 2019-05-03 DIAGNOSIS — R262 Difficulty in walking, not elsewhere classified: Secondary | ICD-10-CM | POA: Insufficient documentation

## 2019-05-03 DIAGNOSIS — J449 Chronic obstructive pulmonary disease, unspecified: Secondary | ICD-10-CM | POA: Diagnosis not present

## 2019-05-03 DIAGNOSIS — E785 Hyperlipidemia, unspecified: Secondary | ICD-10-CM | POA: Insufficient documentation

## 2019-05-03 DIAGNOSIS — Z96651 Presence of right artificial knee joint: Secondary | ICD-10-CM

## 2019-05-03 HISTORY — PX: TOTAL KNEE ARTHROPLASTY: SHX125

## 2019-05-03 LAB — TYPE AND SCREEN
ABO/RH(D): A POS
Antibody Screen: NEGATIVE

## 2019-05-03 SURGERY — ARTHROPLASTY, KNEE, TOTAL
Anesthesia: Spinal | Site: Knee | Laterality: Right

## 2019-05-03 MED ORDER — POVIDONE-IODINE 10 % EX SWAB
2.0000 "application " | Freq: Once | CUTANEOUS | Status: AC
  Administered 2019-05-03: 2 via TOPICAL

## 2019-05-03 MED ORDER — METHOCARBAMOL 500 MG PO TABS
500.0000 mg | ORAL_TABLET | Freq: Four times a day (QID) | ORAL | Status: DC | PRN
  Administered 2019-05-04: 09:00:00 500 mg via ORAL
  Filled 2019-05-03 (×2): qty 1

## 2019-05-03 MED ORDER — ASPIRIN 81 MG PO CHEW
81.0000 mg | CHEWABLE_TABLET | Freq: Two times a day (BID) | ORAL | Status: DC
  Administered 2019-05-03 – 2019-05-04 (×2): 81 mg via ORAL
  Filled 2019-05-03 (×2): qty 1

## 2019-05-03 MED ORDER — METOPROLOL TARTRATE 25 MG PO TABS
25.0000 mg | ORAL_TABLET | Freq: Two times a day (BID) | ORAL | Status: DC
  Administered 2019-05-03 – 2019-05-04 (×2): 25 mg via ORAL
  Filled 2019-05-03 (×2): qty 1

## 2019-05-03 MED ORDER — PROPOFOL 500 MG/50ML IV EMUL
INTRAVENOUS | Status: DC | PRN
  Administered 2019-05-03: 50 ug/kg/min via INTRAVENOUS

## 2019-05-03 MED ORDER — CLOPIDOGREL BISULFATE 75 MG PO TABS
75.0000 mg | ORAL_TABLET | Freq: Every day | ORAL | Status: DC
  Administered 2019-05-04: 11:00:00 75 mg via ORAL
  Filled 2019-05-03: qty 1

## 2019-05-03 MED ORDER — ALUM & MAG HYDROXIDE-SIMETH 200-200-20 MG/5ML PO SUSP: 15.0000 mL | ORAL | Status: DC | PRN

## 2019-05-03 MED ORDER — GABAPENTIN 300 MG PO CAPS
300.0000 mg | ORAL_CAPSULE | Freq: Every day | ORAL | Status: DC
  Administered 2019-05-03: 22:00:00 300 mg via ORAL
  Filled 2019-05-03: qty 1

## 2019-05-03 MED ORDER — TRANEXAMIC ACID-NACL 1000-0.7 MG/100ML-% IV SOLN
1000.0000 mg | INTRAVENOUS | Status: AC
  Administered 2019-05-03: 1000 mg via INTRAVENOUS
  Filled 2019-05-03: qty 100

## 2019-05-03 MED ORDER — DEXAMETHASONE SODIUM PHOSPHATE 10 MG/ML IJ SOLN
10.0000 mg | Freq: Once | INTRAMUSCULAR | Status: AC
  Administered 2019-05-04: 08:00:00 10 mg via INTRAVENOUS
  Filled 2019-05-03: qty 1

## 2019-05-03 MED ORDER — ATORVASTATIN CALCIUM 40 MG PO TABS
40.0000 mg | ORAL_TABLET | Freq: Every day | ORAL | Status: DC
  Filled 2019-05-03: qty 1

## 2019-05-03 MED ORDER — CHLORHEXIDINE GLUCONATE 4 % EX LIQD: 60.0000 mL | Freq: Once | CUTANEOUS | Status: DC

## 2019-05-03 MED ORDER — POLYETHYLENE GLYCOL 3350 17 G PO PACK
17.0000 g | PACK | Freq: Two times a day (BID) | ORAL | Status: DC
  Administered 2019-05-03 – 2019-05-04 (×2): 17 g via ORAL
  Filled 2019-05-03 (×2): qty 1

## 2019-05-03 MED ORDER — ACETAMINOPHEN 500 MG PO TABS
1000.0000 mg | ORAL_TABLET | Freq: Once | ORAL | Status: AC
  Administered 2019-05-03: 08:00:00 1000 mg via ORAL
  Filled 2019-05-03: qty 2

## 2019-05-03 MED ORDER — ONDANSETRON HCL 4 MG PO TABS: 4.0000 mg | ORAL_TABLET | Freq: Four times a day (QID) | ORAL | Status: DC | PRN

## 2019-05-03 MED ORDER — PROPOFOL 500 MG/50ML IV EMUL
INTRAVENOUS | Status: AC
  Filled 2019-05-03: qty 50

## 2019-05-03 MED ORDER — ONDANSETRON HCL 4 MG/2ML IJ SOLN: 4.0000 mg | Freq: Four times a day (QID) | INTRAMUSCULAR | Status: DC | PRN

## 2019-05-03 MED ORDER — SODIUM CHLORIDE 0.9 % IV SOLN: INTRAVENOUS | Status: DC

## 2019-05-03 MED ORDER — SODIUM CHLORIDE (PF) 0.9 % IJ SOLN
INTRAMUSCULAR | Status: DC | PRN
  Administered 2019-05-03: 30 mL

## 2019-05-03 MED ORDER — ONDANSETRON HCL 4 MG/2ML IJ SOLN
INTRAMUSCULAR | Status: DC | PRN
  Administered 2019-05-03: 4 mg via INTRAVENOUS

## 2019-05-03 MED ORDER — OXYCODONE HCL 5 MG/5ML PO SOLN: 5.0000 mg | Freq: Once | ORAL | Status: DC | PRN

## 2019-05-03 MED ORDER — PHENOL 1.4 % MT LIQD: 1.0000 | OROMUCOSAL | Status: DC | PRN

## 2019-05-03 MED ORDER — DOCUSATE SODIUM 100 MG PO CAPS
100.0000 mg | ORAL_CAPSULE | Freq: Two times a day (BID) | ORAL | Status: DC
  Administered 2019-05-03 – 2019-05-04 (×2): 100 mg via ORAL
  Filled 2019-05-03 (×2): qty 1

## 2019-05-03 MED ORDER — SODIUM CHLORIDE (PF) 0.9 % IJ SOLN
INTRAMUSCULAR | Status: AC
  Filled 2019-05-03: qty 50

## 2019-05-03 MED ORDER — 0.9 % SODIUM CHLORIDE (POUR BTL) OPTIME
TOPICAL | Status: DC | PRN
  Administered 2019-05-03: 11:00:00 1000 mL

## 2019-05-03 MED ORDER — FENTANYL CITRATE (PF) 100 MCG/2ML IJ SOLN: 25.0000 ug | INTRAMUSCULAR | Status: DC | PRN

## 2019-05-03 MED ORDER — PHENYLEPHRINE 40 MCG/ML (10ML) SYRINGE FOR IV PUSH (FOR BLOOD PRESSURE SUPPORT)
PREFILLED_SYRINGE | INTRAVENOUS | Status: AC
  Filled 2019-05-03: qty 10

## 2019-05-03 MED ORDER — ISOSORBIDE MONONITRATE ER 60 MG PO TB24
120.0000 mg | ORAL_TABLET | Freq: Every day | ORAL | Status: DC
  Filled 2019-05-03: qty 2

## 2019-05-03 MED ORDER — BUPIVACAINE HCL 0.25 % IJ SOLN
INTRAMUSCULAR | Status: AC
  Filled 2019-05-03: qty 1

## 2019-05-03 MED ORDER — OXYCODONE HCL 5 MG PO TABS: 5.0000 mg | ORAL_TABLET | Freq: Once | ORAL | Status: DC | PRN

## 2019-05-03 MED ORDER — METHOCARBAMOL 500 MG IVPB - SIMPLE MED
500.0000 mg | Freq: Four times a day (QID) | INTRAVENOUS | Status: DC | PRN
  Filled 2019-05-03: qty 50

## 2019-05-03 MED ORDER — BUPIVACAINE HCL (PF) 0.25 % IJ SOLN
INTRAMUSCULAR | Status: DC | PRN
  Administered 2019-05-03: 30 mL

## 2019-05-03 MED ORDER — MEPIVACAINE HCL (PF) 2 % IJ SOLN
INTRAMUSCULAR | Status: DC | PRN
  Administered 2019-05-03: 60 mg via EPIDURAL

## 2019-05-03 MED ORDER — METOCLOPRAMIDE HCL 5 MG/ML IJ SOLN
5.0000 mg | Freq: Three times a day (TID) | INTRAMUSCULAR | Status: DC | PRN
  Filled 2019-05-03: qty 2

## 2019-05-03 MED ORDER — LACTATED RINGERS IV SOLN: INTRAVENOUS | Status: DC

## 2019-05-03 MED ORDER — HYDROCODONE-ACETAMINOPHEN 7.5-325 MG PO TABS
1.0000 | ORAL_TABLET | ORAL | Status: DC | PRN
  Administered 2019-05-03 (×2): 1 via ORAL
  Administered 2019-05-03 – 2019-05-04 (×2): 2 via ORAL
  Filled 2019-05-03: qty 2
  Filled 2019-05-03 (×2): qty 1
  Filled 2019-05-03: qty 2

## 2019-05-03 MED ORDER — HYDROCODONE-ACETAMINOPHEN 5-325 MG PO TABS
1.0000 | ORAL_TABLET | ORAL | Status: DC | PRN
  Administered 2019-05-04: 08:00:00 2 via ORAL
  Filled 2019-05-03: qty 2

## 2019-05-03 MED ORDER — TRANEXAMIC ACID-NACL 1000-0.7 MG/100ML-% IV SOLN
1000.0000 mg | Freq: Once | INTRAVENOUS | Status: AC
  Administered 2019-05-03: 15:00:00 1000 mg via INTRAVENOUS
  Filled 2019-05-03: qty 100

## 2019-05-03 MED ORDER — PANTOPRAZOLE SODIUM 40 MG PO TBEC
40.0000 mg | DELAYED_RELEASE_TABLET | Freq: Every day | ORAL | Status: DC
  Administered 2019-05-04: 08:00:00 40 mg via ORAL
  Filled 2019-05-03: qty 1

## 2019-05-03 MED ORDER — DEXAMETHASONE SODIUM PHOSPHATE 10 MG/ML IJ SOLN
INTRAMUSCULAR | Status: AC
  Filled 2019-05-03: qty 1

## 2019-05-03 MED ORDER — KETOROLAC TROMETHAMINE 30 MG/ML IJ SOLN
INTRAMUSCULAR | Status: AC
  Filled 2019-05-03: qty 1

## 2019-05-03 MED ORDER — KETOROLAC TROMETHAMINE 30 MG/ML IJ SOLN
INTRAMUSCULAR | Status: DC | PRN
  Administered 2019-05-03: 30 mg via INTRAMUSCULAR

## 2019-05-03 MED ORDER — SODIUM CHLORIDE 0.9 % IR SOLN
Status: DC | PRN
  Administered 2019-05-03: 1000 mL

## 2019-05-03 MED ORDER — CELECOXIB 200 MG PO CAPS
200.0000 mg | ORAL_CAPSULE | Freq: Two times a day (BID) | ORAL | Status: DC
  Administered 2019-05-03: 22:00:00 200 mg via ORAL
  Filled 2019-05-03 (×2): qty 1

## 2019-05-03 MED ORDER — BUPIVACAINE-EPINEPHRINE (PF) 0.5% -1:200000 IJ SOLN
INTRAMUSCULAR | Status: DC | PRN
  Administered 2019-05-03: 15 mL via PERINEURAL

## 2019-05-03 MED ORDER — FERROUS SULFATE 325 (65 FE) MG PO TABS
325.0000 mg | ORAL_TABLET | Freq: Two times a day (BID) | ORAL | Status: DC
  Administered 2019-05-03 – 2019-05-04 (×2): 325 mg via ORAL
  Filled 2019-05-03 (×2): qty 1

## 2019-05-03 MED ORDER — DIPHENHYDRAMINE HCL 12.5 MG/5ML PO ELIX: 12.5000 mg | ORAL_SOLUTION | ORAL | Status: DC | PRN

## 2019-05-03 MED ORDER — CEFAZOLIN SODIUM-DEXTROSE 2-4 GM/100ML-% IV SOLN
2.0000 g | INTRAVENOUS | Status: AC
  Administered 2019-05-03: 2 g via INTRAVENOUS
  Filled 2019-05-03: qty 100

## 2019-05-03 MED ORDER — PROMETHAZINE HCL 25 MG/ML IJ SOLN: 6.2500 mg | INTRAMUSCULAR | Status: DC | PRN

## 2019-05-03 MED ORDER — MAGNESIUM CITRATE PO SOLN: 1.0000 | Freq: Once | ORAL | Status: DC | PRN

## 2019-05-03 MED ORDER — PHENYLEPHRINE HCL (PRESSORS) 10 MG/ML IV SOLN
INTRAVENOUS | Status: AC
  Filled 2019-05-03: qty 1

## 2019-05-03 MED ORDER — METOCLOPRAMIDE HCL 5 MG PO TABS: 5.0000 mg | ORAL_TABLET | Freq: Three times a day (TID) | ORAL | Status: DC | PRN

## 2019-05-03 MED ORDER — ACETAMINOPHEN 325 MG PO TABS: 325.0000 mg | ORAL_TABLET | Freq: Four times a day (QID) | ORAL | Status: DC | PRN

## 2019-05-03 MED ORDER — AMLODIPINE BESYLATE 10 MG PO TABS
10.0000 mg | ORAL_TABLET | Freq: Every day | ORAL | Status: DC
  Filled 2019-05-03: qty 1

## 2019-05-03 MED ORDER — ONDANSETRON HCL 4 MG/2ML IJ SOLN
INTRAMUSCULAR | Status: AC
  Filled 2019-05-03: qty 2

## 2019-05-03 MED ORDER — NITROGLYCERIN 0.4 MG SL SUBL: 0.4000 mg | SUBLINGUAL_TABLET | SUBLINGUAL | Status: DC | PRN

## 2019-05-03 MED ORDER — DEXAMETHASONE SODIUM PHOSPHATE 10 MG/ML IJ SOLN
10.0000 mg | Freq: Once | INTRAMUSCULAR | Status: AC
  Administered 2019-05-03: 11:00:00 10 mg via INTRAVENOUS

## 2019-05-03 MED ORDER — PROPOFOL 500 MG/50ML IV EMUL
INTRAVENOUS | Status: DC | PRN
  Administered 2019-05-03: 20 mg via INTRAVENOUS

## 2019-05-03 MED ORDER — FENTANYL CITRATE (PF) 100 MCG/2ML IJ SOLN
50.0000 ug | INTRAMUSCULAR | Status: DC
  Administered 2019-05-03: 10:00:00 50 ug via INTRAVENOUS
  Filled 2019-05-03: qty 2

## 2019-05-03 MED ORDER — MENTHOL 3 MG MT LOZG: 1.0000 | LOZENGE | OROMUCOSAL | Status: DC | PRN

## 2019-05-03 MED ORDER — BISACODYL 10 MG RE SUPP: 10.0000 mg | Freq: Every day | RECTAL | Status: DC | PRN

## 2019-05-03 MED ORDER — MEPIVACAINE HCL (PF) 2 % IJ SOLN
INTRAMUSCULAR | Status: AC
  Filled 2019-05-03: qty 20

## 2019-05-03 MED ORDER — HYDROMORPHONE HCL 1 MG/ML IJ SOLN
0.5000 mg | INTRAMUSCULAR | Status: DC | PRN
  Filled 2019-05-03: qty 1

## 2019-05-03 MED ORDER — PHENYLEPHRINE HCL (PRESSORS) 10 MG/ML IV SOLN
INTRAVENOUS | Status: DC | PRN
  Administered 2019-05-03 (×2): 80 ug via INTRAVENOUS

## 2019-05-03 MED ORDER — MIDAZOLAM HCL 2 MG/2ML IJ SOLN
1.0000 mg | INTRAMUSCULAR | Status: DC
  Administered 2019-05-03: 10:00:00 1 mg via INTRAVENOUS
  Filled 2019-05-03: qty 2

## 2019-05-03 MED ORDER — CEFAZOLIN SODIUM-DEXTROSE 2-4 GM/100ML-% IV SOLN
2.0000 g | Freq: Four times a day (QID) | INTRAVENOUS | Status: AC
  Administered 2019-05-03 (×2): 2 g via INTRAVENOUS
  Filled 2019-05-03 (×2): qty 100

## 2019-05-03 MED ORDER — PHENYLEPHRINE HCL-NACL 10-0.9 MG/250ML-% IV SOLN
INTRAVENOUS | Status: DC | PRN
  Administered 2019-05-03: 50 ug/min via INTRAVENOUS

## 2019-05-03 SURGICAL SUPPLY — 65 items
ADH SKN CLS APL DERMABOND .7 (GAUZE/BANDAGES/DRESSINGS) ×1
ATTUNE MED ANAT PAT 38 KNEE (Knees) ×1 IMPLANT
ATTUNE MED ANAT PAT 38MM KNEE (Knees) ×1 IMPLANT
ATTUNE PS FEM RT SZ 5 CEM KNEE (Femur) ×2 IMPLANT
ATTUNE PSRP INSR SZ5 6 KNEE (Insert) ×1 IMPLANT
ATTUNE PSRP INSR SZ5 6MM KNEE (Insert) ×1 IMPLANT
BAG SPEC THK2 15X12 ZIP CLS (MISCELLANEOUS)
BAG ZIPLOCK 12X15 (MISCELLANEOUS) IMPLANT
BASE TIBIAL ROT PLAT SZ 7 KNEE (Knees) IMPLANT
BLADE SAW SGTL 11.0X1.19X90.0M (BLADE) IMPLANT
BLADE SAW SGTL 13.0X1.19X90.0M (BLADE) ×3 IMPLANT
BLADE SURG SZ10 CARB STEEL (BLADE) ×6 IMPLANT
BNDG ELASTIC 6X5.8 VLCR STR LF (GAUZE/BANDAGES/DRESSINGS) ×3 IMPLANT
BOWL SMART MIX CTS (DISPOSABLE) ×3 IMPLANT
BSPLAT TIB 7 CMNT ROT PLAT STR (Knees) ×1 IMPLANT
CEMENT HV SMART SET (Cement) ×4 IMPLANT
COVER SURGICAL LIGHT HANDLE (MISCELLANEOUS) ×3 IMPLANT
COVER WAND RF STERILE (DRAPES) IMPLANT
CUFF TOURN SGL QUICK 34 (TOURNIQUET CUFF) ×3
CUFF TRNQT CYL 34X4.125X (TOURNIQUET CUFF) ×1 IMPLANT
DECANTER SPIKE VIAL GLASS SM (MISCELLANEOUS) ×10 IMPLANT
DERMABOND ADVANCED (GAUZE/BANDAGES/DRESSINGS) ×2
DERMABOND ADVANCED .7 DNX12 (GAUZE/BANDAGES/DRESSINGS) ×1 IMPLANT
DRAPE U-SHAPE 47X51 STRL (DRAPES) ×3 IMPLANT
DRESSING AQUACEL AG SP 3.5X10 (GAUZE/BANDAGES/DRESSINGS) ×1 IMPLANT
DRSG AQUACEL AG SP 3.5X10 (GAUZE/BANDAGES/DRESSINGS) ×3
DURAPREP 26ML APPLICATOR (WOUND CARE) ×6 IMPLANT
ELECT REM PT RETURN 15FT ADLT (MISCELLANEOUS) ×3 IMPLANT
GLOVE BIO SURGEON STRL SZ 6 (GLOVE) ×3 IMPLANT
GLOVE BIOGEL PI IND STRL 6.5 (GLOVE) ×1 IMPLANT
GLOVE BIOGEL PI IND STRL 7.5 (GLOVE) ×1 IMPLANT
GLOVE BIOGEL PI IND STRL 8.5 (GLOVE) ×1 IMPLANT
GLOVE BIOGEL PI INDICATOR 6.5 (GLOVE) ×2
GLOVE BIOGEL PI INDICATOR 7.5 (GLOVE) ×2
GLOVE BIOGEL PI INDICATOR 8.5 (GLOVE) ×2
GLOVE ECLIPSE 8.0 STRL XLNG CF (GLOVE) ×3 IMPLANT
GLOVE ORTHO TXT STRL SZ7.5 (GLOVE) ×3 IMPLANT
GOWN STRL REUS W/ TWL LRG LVL3 (GOWN DISPOSABLE) ×1 IMPLANT
GOWN STRL REUS W/TWL 2XL LVL3 (GOWN DISPOSABLE) ×3 IMPLANT
GOWN STRL REUS W/TWL LRG LVL3 (GOWN DISPOSABLE) ×6 IMPLANT
HANDPIECE INTERPULSE COAX TIP (DISPOSABLE) ×3
HOLDER FOLEY CATH W/STRAP (MISCELLANEOUS) IMPLANT
KIT TURNOVER KIT A (KITS) IMPLANT
MANIFOLD NEPTUNE II (INSTRUMENTS) ×3 IMPLANT
NDL SAFETY ECLIPSE 18X1.5 (NEEDLE) IMPLANT
NEEDLE HYPO 18GX1.5 SHARP (NEEDLE)
NS IRRIG 1000ML POUR BTL (IV SOLUTION) ×3 IMPLANT
PACK TOTAL KNEE CUSTOM (KITS) ×3 IMPLANT
PENCIL SMOKE EVACUATOR (MISCELLANEOUS) IMPLANT
PIN DRILL FIX HALF THREAD (BIT) ×2 IMPLANT
PIN FIX SIGMA LCS THRD HI (PIN) ×2 IMPLANT
PROTECTOR NERVE ULNAR (MISCELLANEOUS) ×3 IMPLANT
SET HNDPC FAN SPRY TIP SCT (DISPOSABLE) ×1 IMPLANT
SET PAD KNEE POSITIONER (MISCELLANEOUS) ×3 IMPLANT
SUT MNCRL AB 4-0 PS2 18 (SUTURE) ×3 IMPLANT
SUT STRATAFIX PDS+ 0 24IN (SUTURE) ×3 IMPLANT
SUT VIC AB 1 CT1 36 (SUTURE) ×3 IMPLANT
SUT VIC AB 2-0 CT1 27 (SUTURE) ×9
SUT VIC AB 2-0 CT1 TAPERPNT 27 (SUTURE) ×3 IMPLANT
SYR 3ML LL SCALE MARK (SYRINGE) ×3 IMPLANT
TIBIAL BASE ROT PLAT SZ 7 KNEE (Knees) ×3 IMPLANT
TRAY FOLEY MTR SLVR 16FR STAT (SET/KITS/TRAYS/PACK) ×3 IMPLANT
WATER STERILE IRR 1000ML POUR (IV SOLUTION) ×2 IMPLANT
WRAP KNEE MAXI GEL POST OP (GAUZE/BANDAGES/DRESSINGS) ×3 IMPLANT
YANKAUER SUCT BULB TIP 10FT TU (MISCELLANEOUS) ×3 IMPLANT

## 2019-05-03 NOTE — Anesthesia Procedure Notes (Signed)
Anesthesia Regional Block: Adductor canal block   Pre-Anesthetic Checklist: ,, timeout performed, Correct Patient, Correct Site, Correct Laterality, Correct Procedure, Correct Position, site marked, Risks and benefits discussed, pre-op evaluation,  At surgeon's request and post-op pain management  Laterality: Right  Prep: Maximum Sterile Barrier Precautions used, chloraprep       Needles:  Injection technique: Single-shot  Needle Type: Echogenic Stimulator Needle     Needle Length: 9cm  Needle Gauge: 22     Additional Needles:   Procedures:,,,, ultrasound used (permanent image in chart),,,,  Narrative:  Start time: 05/03/2019 9:46 AM End time: 05/03/2019 9:48 AM Injection made incrementally with aspirations every 5 mL.  Performed by: Personally  Anesthesiologist: Brennan Bailey, MD  Additional Notes: Risks, benefits, and alternative discussed. Patient gave consent for procedure. Patient prepped and draped in sterile fashion. Sedation administered, patient remains easily responsive to voice. Relevant anatomy identified with ultrasound guidance. Local anesthetic given in 5cc increments with no signs or symptoms of intravascular injection. No pain or paraesthesias with injection. Patient monitored throughout procedure with signs of LAST or immediate complications. Tolerated well. Ultrasound image placed in chart.  Tawny Asal, MD

## 2019-05-03 NOTE — Anesthesia Procedure Notes (Signed)
Spinal  Patient location during procedure: OR Start time: 05/03/2019 10:35 AM End time: 05/03/2019 10:40 AM Staffing Performed: anesthesiologist  Anesthesiologist: Brennan Bailey, MD Preanesthetic Checklist Completed: patient identified, IV checked, risks and benefits discussed, surgical consent, monitors and equipment checked, pre-op evaluation and timeout performed Spinal Block Patient position: sitting Prep: DuraPrep and site prepped and draped Patient monitoring: continuous pulse ox, blood pressure and heart rate Approach: right paramedian Location: L2-3 Injection technique: single-shot Needle Needle type: Pencan and Whitacre  Needle gauge: 22 G Needle length: 9 cm Additional Notes Risks, benefits, and alternative discussed. Patient gave consent to procedure. Prepped and draped in sitting position. Patient sedated but responsive to voice. Midline lumbar scar from previous fusion. First attempt x2 by CRNA at L3-4 unable to access interspinous space. Clear CSF obtained by myself at L2-3 right paramedian approach after failed midline attempt x1. Positive terminal aspiration. No pain or paraesthesias with injection. Patient tolerated procedure well. Vital signs stable. Tawny Asal, MD

## 2019-05-03 NOTE — Transfer of Care (Signed)
Immediate Anesthesia Transfer of Care Note  Patient: COLONEL KRAUSER  Procedure(s) Performed: TOTAL KNEE ARTHROPLASTY (Right Knee)  Patient Location: PACU  Anesthesia Type:MAC and Spinal  Level of Consciousness: awake, alert  and oriented  Airway & Oxygen Therapy: Patient Spontanous Breathing and Patient connected to face mask oxygen  Post-op Assessment: Report given to RN and Post -op Vital signs reviewed and stable  Post vital signs: Reviewed and stable  Last Vitals:  Vitals Value Taken Time  BP 112/60 05/03/19 1227  Temp    Pulse 62 05/03/19 1228  Resp 17 05/03/19 1228  SpO2 100 % 05/03/19 1228  Vitals shown include unvalidated device data.  Last Pain:  Vitals:   05/03/19 0759  TempSrc: Oral  PainSc:       Patients Stated Pain Goal: 3 (74/08/14 4818)  Complications: No apparent anesthesia complications

## 2019-05-03 NOTE — Discharge Instructions (Signed)

## 2019-05-03 NOTE — Evaluation (Signed)
Physical Therapy Evaluation Patient Details Name: Robert Melton MRN: [id number] DOB: [birth year] Today's Date: 05/03/2019   History of Present Illness  Pt s/p R TKR and iwth hx of MI, COPD, CAD, L4-S1 fusion and CABG  Clinical Impression  Pt s/p R TKR and presents with decreased R LE strength/ROM and post op pain limiting functional mobility.  Pt should progress to dc home with family assist.  Per pt, he will initially have [facility] for follow up.    Follow Up Recommendations Follow surgeon's recommendation for DC plan and follow-up therapies    Equipment Recommendations  Other (comment)(Spouse is checking walker at home; Not sure he wants 3n1)    Recommendations for Other Services       Precautions / Restrictions Precautions Precautions: Knee;Fall Restrictions Weight Bearing Restrictions: No Other Position/Activity Restrictions: WBAT      Mobility  Bed Mobility Overal bed mobility: Needs Assistance Bed Mobility: Supine to Sit     Supine to sit: Min assist     General bed mobility comments: cues for sequence and use of L LE to self assist  Transfers Overall transfer level: Needs assistance Equipment used: Rolling walker (2 wheeled) Transfers: Sit to/from Stand Sit to Stand: Min assist         General transfer comment: cues for LE management and use of UEs to self assist  Ambulation/Gait Ambulation/Gait assistance: Min assist;Min guard Gait Distance (Feet): 75 Feet Assistive device: Rolling walker (2 wheeled) Gait Pattern/deviations: Step-to pattern;Antalgic;Decreased step length - right;Decreased step length - left;Shuffle Gait velocity: decr   General Gait Details: cues for posture, position from RW and sequence  Stairs            Wheelchair Mobility    Modified Rankin (Stroke Patients Only)       Balance Overall balance assessment: Mild deficits observed, not formally tested                                            Pertinent Vitals/Pain Pain Assessment: 0-10 Pain Score: 5  Pain Location: R knee Pain Descriptors / Indicators: Aching;Sore Pain Intervention(s): Limited activity within patient's tolerance;Monitored during session;Premedicated before session;Ice applied    Home Living Family/patient expects to be discharged to:: Private residence Living Arrangements: Spouse/significant other Available Help at Discharge: Family Type of Home: Mobile home Home Access: Ramped entrance     Home Layout: One level Home Equipment: Environmental consultant - 4 wheels;Walker - standard      Prior Function Level of Independence: Independent               Hand Dominance        Extremity/Trunk Assessment   Upper Extremity Assessment Upper Extremity Assessment: Overall WFL for tasks assessed    Lower Extremity Assessment Lower Extremity Assessment: RLE deficits/detail    Cervical / Trunk Assessment Cervical / Trunk Assessment: Normal  Communication   Communication: No difficulties  Cognition Arousal/Alertness: Awake/alert Behavior During Therapy: WFL for tasks assessed/performed Overall Cognitive Status: Within Functional Limits for tasks assessed                                        General Comments      Exercises Total Joint Exercises Ankle Circles/Pumps: AROM;Both;15 reps;Supine   Assessment/Plan    PT Assessment  Patient needs continued PT services  PT Problem List Decreased strength;Decreased range of motion;Decreased activity tolerance;Decreased mobility;Decreased knowledge of use of DME;Pain       PT Treatment Interventions DME instruction;Gait training;Stair training;Functional mobility training;Therapeutic activities;Therapeutic exercise;Patient/family education    PT Goals (Current goals can be found in the Care Plan section)  Acute Rehab PT Goals Patient Stated Goal: Regain IND PT Goal Formulation: With patient Time For Goal Achievement: 05/10/19 Potential  to Achieve Goals: Good    Frequency 7X/week   Barriers to discharge        Co-evaluation               AM-PAC PT "6 Clicks" Mobility  Outcome Measure Help needed turning from your back to your side while in a flat bed without using bedrails?: A Little Help needed moving from lying on your back to sitting on the side of a flat bed without using bedrails?: A Little Help needed moving to and from a bed to a chair (including a wheelchair)?: A Little Help needed standing up from a chair using your arms (e.g., wheelchair or bedside chair)?: A Little Help needed to walk in hospital room?: A Little Help needed climbing 3-5 steps with a railing? : A Lot 6 Click Score: 17    End of Session Equipment Utilized During Treatment: Gait belt Activity Tolerance: Patient tolerated treatment well Patient left: in chair;with call bell/phone within reach;with chair alarm set;with family/visitor present Nurse Communication: Mobility status PT Visit Diagnosis: Difficulty in walking, not elsewhere classified (R26.2)    Time: [id number] PT Time Calculation (min) (ACUTE ONLY): 21 min   Charges:   PT Evaluation $PT Eval Low Complexity: 1 Low          [place] Pager [phone] Office [phone]   Leldon Steege 05/03/2019, 5:24 PM

## 2019-05-03 NOTE — Anesthesia Postprocedure Evaluation (Signed)
Anesthesia Post Note  Patient: Robert Melton  Procedure(s) Performed: TOTAL KNEE ARTHROPLASTY (Right Knee)     Patient location during evaluation: PACU Anesthesia Type: Spinal Level of consciousness: awake and alert and oriented Pain management: pain level controlled Vital Signs Assessment: post-procedure vital signs reviewed and stable Respiratory status: spontaneous breathing, nonlabored ventilation and respiratory function stable Cardiovascular status: blood pressure returned to baseline Postop Assessment: no apparent nausea or vomiting, spinal receding, no headache and no backache Anesthetic complications: no    Last Vitals:  Vitals:   05/03/19 1316 05/03/19 1410  BP: 135/64 137/81  Pulse: (!) 59 (!) 59  Resp: 16 16  Temp: 36.9 C 36.8 C  SpO2: 100% 98%    Last Pain:  Vitals:   05/03/19 1410  TempSrc: Oral  PainSc:                  Brennan Bailey

## 2019-05-03 NOTE — Progress Notes (Signed)
AssistedDr. Birdie Sons with right, ultrasound guided, adductor canal block. Side rails up, monitors on throughout procedure. See vital signs in flow sheet. Tolerated Procedure well.

## 2019-05-03 NOTE — Op Note (Signed)
NAME:  Robert Melton                      MEDICAL RECORD NO.:  [id number]                             FACILITY:  [facility]      PHYSICIAN:  Pietro Cassis. Alvan Dame, M.D.  DATE OF BIRTH:  09-10-1950      DATE OF PROCEDURE:  05/03/2019                                     OPERATIVE REPORT         PREOPERATIVE DIAGNOSIS:  Right knee osteoarthritis.      POSTOPERATIVE DIAGNOSIS:  Right knee osteoarthritis.      FINDINGS:  The patient was noted to have complete loss of cartilage and   bone-on-bone arthritis with associated osteophytes in the medial and patellofemoral compartments of   the knee with a significant synovitis and associated effusion.  The patient had failed months of conservative treatment including medications, injection therapy, activity modification.     PROCEDURE:  Right total knee replacement.      COMPONENTS USED:  DePuy Attune rotating platform posterior stabilized knee   system, a size 5 femur, 7 tibia, size 6 mm PS AOX insert, and 38 anatomic patellar   button.      SURGEON:  Pietro Cassis. Alvan Dame, M.D.      ASSISTANT:  Danae Orleans, PA-C.      ANESTHESIA:  Regional and Spinal.      SPECIMENS:  None.      COMPLICATION:  None.      DRAINS:  None.  EBL: <100 cc     TOURNIQUET TIME:   Total Tourniquet Time Documented: Thigh (Right) - 32 minutes Total: Thigh (Right) - 32 minutes  .      The patient was stable to the recovery room.      INDICATION FOR PROCEDURE:  Robert Melton is a 68 y.o. male patient of   mine.  The patient had been seen, evaluated, and treated for months conservatively in the   office with medication, activity modification, and injections.  The patient had   radiographic changes of bone-on-bone arthritis with endplate sclerosis and osteophytes noted.  Based on the radiographic changes and failed conservative measures, the patient   decided to proceed with definitive treatment, total knee replacement.  Risks of infection, DVT, component failure, need  for revision surgery, neurovascular injury were reviewed in the office setting.  The postop course was reviewed stressing the efforts to maximize post-operative satisfaction and function.  Consent was obtained for benefit of pain   relief.      PROCEDURE IN DETAIL:  The patient was brought to the operative theater.   Once adequate anesthesia, preoperative antibiotics, 2 gm of Ancef,1 gm of Tranexamic Acid, and 10 mg of Decadron administered, the patient was positioned supine with a right thigh tourniquet placed.  The  right lower extremity was prepped and draped in sterile fashion.  A time-   out was performed identifying the patient, planned procedure, and the appropriate extremity.      The right lower extremity was placed in the [facility] leg holder.  The leg was   exsanguinated, tourniquet elevated to 250 mmHg.  A midline incision was   made  followed by median parapatellar arthrotomy.  Following initial   exposure, attention was first directed to the patella.  Precut   measurement was noted to be 24 mm.  I resected down to 14 mm and used a   38 anatomic patellar button to restore patellar height as well as cover the cut surface.      The lug holes were drilled and a metal shim was placed to protect the   patella from retractors and saw blade during the procedure.      At this point, attention was now directed to the femur.  The femoral   canal was opened with a drill, irrigated to try to prevent fat emboli.  An   intramedullary rod was passed at 3 degrees valgus, 9 mm of bone was   resected off the distal femur.  Following this resection, the tibia was   subluxated anteriorly.  Using the extramedullary guide, 2 mm of bone was resected off   the proximal medial tibia.  We confirmed the gap would be   stable medially and laterally with a size 5 spacer block as well as confirmed that the tibial cut was perpendicular in the coronal plane, checking with an alignment rod.      Once this was  done, I sized the femur to be a size 5 in the anterior-   posterior dimension, chose a standard component based on medial and   lateral dimension.  The size 5 rotation block was then pinned in   position anterior referenced using the C-clamp to set rotation.  The   anterior, posterior, and  chamfer cuts were made without difficulty nor   notching making certain that I was along the anterior cortex to help   with flexion gap stability.      The final box cut was made off the lateral aspect of distal femur.      At this point, the tibia was sized to be a size 7.  The size 7 tray was   then pinned in position through the medial third of the tubercle,   drilled, and keel punched.  Trial reduction was now carried with a 5 femur,  7 tibia, a size 6 mm PS insert, and the 38 anatomic patella botton.  The knee was brought to full extension with good flexion stability with the patella   tracking through the trochlea without application of pressure.  Given   all these findings the trial components removed.  Final components were   opened and cement was mixed.  The knee was irrigated with normal saline solution and pulse lavage.  The synovial lining was   then injected with 30 cc of 0.25% Marcaine with epinephrine, 1 cc of Toradol and 30 cc of NS for a total of 61 cc.     Final implants were then cemented onto cleaned and dried cut surfaces of bone with the knee brought to extension with a size 6 mm PS trial insert.      Once the cement had fully cured, excess cement was removed   throughout the knee.  I confirmed that I was satisfied with the range of   motion and stability, and the final size 6 mm PS AOX insert was chosen.  It was   placed into the knee.      The tourniquet had been let down at 32 minutes.  No significant   hemostasis was required.  The extensor mechanism was then reapproximated using #1 Vicryl and #  1 Stratafix sutures with the knee   in flexion.  The   remaining wound was closed  with 2-0 Vicryl and running 4-0 Monocryl.   The knee was cleaned, dried, dressed sterilely using Dermabond and   Aquacel dressing.  The patient was then   brought to recovery room in stable condition, tolerating the procedure   well.   Please note that Physician Assistant, Danae Orleans, PA-C was present for the entirety of the case, and was utilized for pre-operative positioning, peri-operative retractor management, general facilitation of the procedure and for primary wound closure at the end of the case.              Pietro Cassis Alvan Dame, M.D.    05/03/2019 12:00 PM

## 2019-05-03 NOTE — Anesthesia Procedure Notes (Signed)
Date/Time: 05/03/2019 10:25 AM Performed by: Glory Buff, CRNA Oxygen Delivery Method: Simple face mask

## 2019-05-03 NOTE — Interval H&P Note (Signed)
History and Physical Interval Note:  05/03/2019 8:54 AM  Robert Melton  has presented today for surgery, with the diagnosis of Right knee osteoarthritis.  The various methods of treatment have been discussed with the patient and family. After consideration of risks, benefits and other options for treatment, the patient has consented to  Procedure(s) with comments: TOTAL KNEE ARTHROPLASTY (Right) - 70 mins as a surgical intervention.  The patient's history has been reviewed, patient examined, no change in status, stable for surgery.  I have reviewed the patient's chart and labs.  Questions were answered to the patient's satisfaction.     Mauri Pole

## 2019-05-03 NOTE — Plan of Care (Signed)

## 2019-05-04 ENCOUNTER — Encounter: Payer: Self-pay | Admitting: *Deleted

## 2019-05-04 DIAGNOSIS — M1711 Unilateral primary osteoarthritis, right knee: Secondary | ICD-10-CM | POA: Diagnosis not present

## 2019-05-04 LAB — BASIC METABOLIC PANEL
Anion gap: 8 (ref 5–15)
BUN: 25 mg/dL — ABNORMAL HIGH (ref 8–23)
CO2: 20 mmol/L — ABNORMAL LOW (ref 22–32)
Calcium: 8.8 mg/dL — ABNORMAL LOW (ref 8.9–10.3)
Chloride: 107 mmol/L (ref 98–111)
Creatinine, Ser: 1.05 mg/dL (ref 0.61–1.24)
GFR calc Af Amer: >60 mL/min (ref >60)
GFR calc non Af Amer: >60 mL/min (ref >60)
Glucose, Bld: 139 mg/dL — ABNORMAL HIGH (ref 70–99)
Potassium: 4.7 mmol/L (ref 3.5–5.1)
Sodium: 135 mmol/L (ref 135–145)

## 2019-05-04 LAB — CBC
HCT: 40 % (ref 39.0–52.0)
Hemoglobin: 13.3 g/dL (ref 13.0–17.0)
MCH: 31.4 pg (ref 26.0–34.0)
MCHC: 33.3 g/dL (ref 30.0–36.0)
MCV: 94.3 fL (ref 80.0–100.0)
Platelets: 173 10*3/uL (ref 150–400)
RBC: 4.24 MIL/uL (ref 4.22–5.81)
RDW: 13.2 % (ref 11.5–15.5)
WBC: 16.2 10*3/uL — ABNORMAL HIGH (ref 4.0–10.5)
nRBC: 0 % (ref 0.0–0.2)

## 2019-05-04 MED ORDER — HYDROCODONE-ACETAMINOPHEN 7.5-325 MG PO TABS: 1.0000 | ORAL_TABLET | ORAL | 0 refills | Status: DC | PRN

## 2019-05-04 MED ORDER — DOCUSATE SODIUM 100 MG PO CAPS: 100.0000 mg | ORAL_CAPSULE | Freq: Two times a day (BID) | ORAL | 0 refills | Status: DC

## 2019-05-04 MED ORDER — POLYETHYLENE GLYCOL 3350 17 G PO PACK: 17.0000 g | PACK | Freq: Two times a day (BID) | ORAL | 0 refills | Status: DC

## 2019-05-04 MED ORDER — FERROUS SULFATE 325 (65 FE) MG PO TABS: 325.0000 mg | ORAL_TABLET | Freq: Three times a day (TID) | ORAL | 0 refills | Status: DC

## 2019-05-04 MED ORDER — CYCLOBENZAPRINE HCL 5 MG PO TABS: 5.0000 mg | ORAL_TABLET | Freq: Three times a day (TID) | ORAL | 0 refills | Status: DC | PRN

## 2019-05-04 NOTE — Care Management CC44 (Signed)
Condition Code 44 Documentation Completed  Patient Details  Name: Robert Melton MRN: [id number] Date of Birth: May 10, 1951   Condition Code 44 given:  Yes Patient signature on Condition Code 44 notice:  Yes Documentation of 2 MD's agreement:  Yes Code 44 added to claim:  Yes    Lia Hopping, LCSW 05/04/2019, 9:38 AM

## 2019-05-04 NOTE — Care Management Obs Status (Signed)
[place] NOTIFICATION   Patient Details  Name: Robert Melton MRN: [id number] Date of Birth: November 16, 1950   Medicare Observation Status Notification Given:  Yes    Lia Hopping, [place] 05/04/2019, 9:38 AM

## 2019-05-04 NOTE — Progress Notes (Signed)
Physical Therapy Treatment Patient Details Name: Robert Melton MRN: [id number] DOB: 1950/09/10 Today's Date: 05/04/2019    History of Present Illness Pt s/p R TKR and iwth hx of MI, COPD, CAD, L4-S1 fusion and CABG    PT Comments    Pt progressing well with mobility including navigating stairs.  Pt performed HEP with assist - written instruction provided and reviewed.   Follow Up Recommendations  Follow surgeon's recommendation for DC plan and follow-up therapies     Equipment Recommendations  Rolling walker with 5" wheels;3in1 (PT)    Recommendations for Other Services       Precautions / Restrictions Precautions Precautions: Knee;Fall Restrictions Weight Bearing Restrictions: No Other Position/Activity Restrictions: WBAT    Mobility  Bed Mobility Overal bed mobility: Needs Assistance Bed Mobility: Supine to Sit     Supine to sit: Min guard;Supervision     General bed mobility comments: cues for sequence and use of L LE to self assist  Transfers Overall transfer level: Needs assistance Equipment used: Rolling walker (2 wheeled) Transfers: Sit to/from Stand Sit to Stand: Min guard;Supervision         General transfer comment: cues for LE management and use of UEs to self assist  Ambulation/Gait Ambulation/Gait assistance: Min guard;Supervision Gait Distance (Feet): 200 Feet Assistive device: Rolling walker (2 wheeled) Gait Pattern/deviations: Antalgic;Decreased step length - right;Decreased step length - left;Shuffle;Step-to pattern;Step-through pattern Gait velocity: decr   General Gait Details: cues for posture, position from RW and initial sequence   Stairs Stairs: Yes Stairs assistance: Min guard Stair Management: No rails;Step to pattern;Backwards;With walker Number of Stairs: 2 General stair comments: single step twice bkwd to practice for stool into high bed   Wheelchair Mobility    Modified Rankin (Stroke Patients Only)        Balance Overall balance assessment: Mild deficits observed, not formally tested                                          Cognition Arousal/Alertness: Awake/alert Behavior During Therapy: WFL for tasks assessed/performed Overall Cognitive Status: Within Functional Limits for tasks assessed                                        Exercises Total Joint Exercises Ankle Circles/Pumps: AROM;Both;15 reps;Supine Quad Sets: AROM;Both;10 reps;Supine Heel Slides: AAROM;Right;15 reps;Supine Straight Leg Raises: AAROM;AROM;Right;15 reps;Supine Long Arc Quad: AAROM;AROM;Right;10 reps;Seated    General Comments        Pertinent Vitals/Pain Pain Assessment: 0-10 Pain Score: 4  Pain Location: R knee Pain Descriptors / Indicators: Aching;Sore Pain Intervention(s): Limited activity within patient's tolerance;Monitored during session;Premedicated before session;Ice applied    Home Living                      Prior Function            PT Goals (current goals can now be found in the care plan section) Acute Rehab PT Goals Patient Stated Goal: Regain IND PT Goal Formulation: With patient Time For Goal Achievement: 05/10/19 Potential to Achieve Goals: Good Progress towards PT goals: Progressing toward goals    Frequency    7X/week      PT Plan Current plan remains appropriate    Co-evaluation  AM-PAC PT "6 Clicks" Mobility   Outcome Measure  Help needed turning from your back to your side while in a flat bed without using bedrails?: A Little Help needed moving from lying on your back to sitting on the side of a flat bed without using bedrails?: A Little Help needed moving to and from a bed to a chair (including a wheelchair)?: A Little Help needed standing up from a chair using your arms (e.g., wheelchair or bedside chair)?: A Little Help needed to walk in hospital room?: A Little Help needed climbing 3-5 steps with  a railing? : A Little 6 Click Score: 18    End of Session Equipment Utilized During Treatment: Gait belt Activity Tolerance: Patient tolerated treatment well Patient left: in chair;with call bell/phone within reach;with chair alarm set Nurse Communication: Mobility status PT Visit Diagnosis: Difficulty in walking, not elsewhere classified (R26.2)     Time: [id number] PT Time Calculation (min) (ACUTE ONLY): 30 min  Charges:  $Gait Training: 8-22 mins $Therapeutic Exercise: 8-22 mins                     [place] Pager [phone] Office [phone]    Tycho Cheramie 05/04/2019, 11:41 AM

## 2019-05-04 NOTE — Plan of Care (Signed)
Problem: Education: Goal: Knowledge of General Education information will improve Description: Including pain rating scale, medication(s)/side effects and non-pharmacologic comfort measures 05/04/2019 2024 by Deetta Perla, RN Outcome: Adequate for Discharge 05/04/2019 1005 by Earlisha Sharples, Helane Gunther, RN Outcome: Progressing   Problem: Health Behavior/Discharge Planning: Goal: Ability to manage health-related needs will improve 05/04/2019 2024 by Deetta Perla, RN Outcome: Adequate for Discharge 05/04/2019 1005 by [place] Skalicky, Helane Gunther, RN Outcome: Progressing   Problem: Clinical Measurements: Goal: Ability to maintain clinical measurements within normal limits will improve 05/04/2019 2024 by Deetta Perla, RN Outcome: Adequate for Discharge 05/04/2019 1005 by Estie Sproule, Helane Gunther, RN Outcome: Progressing Goal: Will remain free from infection 05/04/2019 2024 by Deetta Perla, RN Outcome: Adequate for Discharge 05/04/2019 1005 by Deetta Perla, RN Outcome: Progressing Goal: Diagnostic test results will improve 05/04/2019 2024 by Deetta Perla, RN Outcome: Adequate for Discharge 05/04/2019 1005 by Deetta Perla, RN Outcome: Progressing Goal: Respiratory complications will improve 05/04/2019 2024 by Deetta Perla, RN Outcome: Adequate for Discharge 05/04/2019 1005 by Deetta Perla, RN Outcome: Progressing Goal: Cardiovascular complication will be avoided 05/04/2019 2024 by Deetta Perla, RN Outcome: Adequate for Discharge 05/04/2019 1005 by Merrisa Skorupski, Helane Gunther, RN Outcome: Progressing   Problem: Activity: Goal: Risk for activity intolerance will decrease 05/04/2019 2024 by Deetta Perla, RN Outcome: Adequate for Discharge 05/04/2019 1005 by Linell Shawn, Helane Gunther, RN Outcome: Progressing   Problem: Nutrition: Goal: Adequate nutrition will be maintained 05/04/2019 2024 by Deetta Perla, RN Outcome: Adequate for Discharge 05/04/2019 1005  by Kaleea Penner, Helane Gunther, RN Outcome: Progressing   Problem: Coping: Goal: Level of anxiety will decrease 05/04/2019 2024 by Deetta Perla, RN Outcome: Adequate for Discharge 05/04/2019 1005 by Quianna Avery, Helane Gunther, RN Outcome: Progressing   Problem: Elimination: Goal: Will not experience complications related to bowel motility 05/04/2019 2024 by Deetta Perla, RN Outcome: Adequate for Discharge 05/04/2019 1005 by Jaegar Croft, Helane Gunther, RN Outcome: Progressing Goal: Will not experience complications related to urinary retention 05/04/2019 2024 by Deetta Perla, RN Outcome: Adequate for Discharge 05/04/2019 1005 by Johnhenry Tippin, Helane Gunther, RN Outcome: Progressing   Problem: Elimination: Goal: Will not experience complications related to bowel motility 05/04/2019 2024 by Deetta Perla, RN Outcome: Adequate for Discharge 05/04/2019 1005 by Deetta Perla, RN Outcome: Progressing Goal: Will not experience complications related to urinary retention 05/04/2019 2024 by Deetta Perla, RN Outcome: Adequate for Discharge 05/04/2019 1005 by Garv Kuechle, Helane Gunther, RN Outcome: Progressing   Problem: Pain Managment: Goal: General experience of comfort will improve 05/04/2019 2024 by Deetta Perla, RN Outcome: Adequate for Discharge 05/04/2019 1005 by Rody Keadle, Helane Gunther, RN Outcome: Progressing   Problem: Safety: Goal: Ability to remain free from injury will improve 05/04/2019 2024 by Deetta Perla, RN Outcome: Adequate for Discharge 05/04/2019 1005 by Deetta Perla, RN Outcome: Progressing   Problem: Skin Integrity: Goal: Risk for impaired skin integrity will decrease 05/04/2019 2024 by Deetta Perla, RN Outcome: Adequate for Discharge 05/04/2019 1005 by Kariya Lavergne, Helane Gunther, RN Outcome: Progressing   Problem: Education: Goal: Knowledge of the prescribed therapeutic regimen will improve 05/04/2019 2024 by Deetta Perla, RN Outcome: Adequate for  Discharge 05/04/2019 1005 by Deetta Perla, RN Outcome: Progressing Goal: Individualized Educational Video(s) 05/04/2019 2024 by Deetta Perla, RN Outcome: Adequate for Discharge 05/04/2019 1005 by Lindel Marcell, Helane Gunther, RN Outcome: Progressing   Problem: Activity: Goal: Ability to avoid complications of mobility impairment will improve  05/04/2019 2024 by Deetta Perla, RN Outcome: Adequate for Discharge 05/04/2019 1005 by Tavious Griesinger, Helane Gunther, RN Outcome: Progressing Goal: Range of joint motion will improve 05/04/2019 2024 by Deetta Perla, RN Outcome: Adequate for Discharge 05/04/2019 1005 by Crislyn Willbanks, Helane Gunther, RN Outcome: Progressing   Problem: Clinical Measurements: Goal: Postoperative complications will be avoided or minimized 05/04/2019 2024 by Deetta Perla, RN Outcome: Adequate for Discharge 05/04/2019 1005 by Addiel Mccardle, Helane Gunther, RN Outcome: Progressing   Problem: Pain Management: Goal: Pain level will decrease with appropriate interventions 05/04/2019 2024 by Deetta Perla, RN Outcome: Adequate for Discharge 05/04/2019 1005 by Rashanda Magloire, Helane Gunther, RN Outcome: Progressing   Problem: Skin Integrity: Goal: Will show signs of wound healing 05/04/2019 2024 by Deetta Perla, RN Outcome: Adequate for Discharge 05/04/2019 1005 by Journey [place], Helane Gunther, RN Outcome: Progressing

## 2019-05-04 NOTE — Plan of Care (Signed)

## 2019-05-04 NOTE — Progress Notes (Signed)
Patient ID: Robert Melton, male   DOB: [birth year], 68 y.o.   MRN: [id number] Subjective: 1 Day Post-Op Procedure(s) (LRB): TOTAL KNEE ARTHROPLASTY (Right)    Patient reports pain as mild.  Doing a lot better this am.  Ready to get up and moving. Wishes to have HHPT  Objective:   VITALS:   Vitals:   05/04/19 0101 05/04/19 0500  BP: 122/74 (!) 160/76  Pulse: 71 65  Resp: 16 16  Temp: 98.4 F (36.9 C) 98.2 F (36.8 C)  SpO2: 96% 98%    Neurovascular intact Incision: dressing C/D/I  LABS Recent Labs    05/04/19 0213  HGB 13.3  HCT 40.0  WBC 16.2*  PLT 173    Recent Labs    05/04/19 0213  NA 135  K 4.7  BUN 25*  CREATININE 1.05  GLUCOSE 139*    No results for input(s): LABPT, INR in the last 72 hours.   Assessment/Plan: 1 Day Post-Op Procedure(s) (LRB): TOTAL KNEE ARTHROPLASTY (Right)   Advance diet Up with therapy Discharge home with home health PT for 2 weeks RTC in 2 weeks Goals reviewed

## 2019-05-04 NOTE — TOC Transition Note (Signed)
Transition of Care [facility]) - CM/SW Discharge Note   Patient Details  Name: Robert Melton MRN: [id number] Date of Birth: 1951-03-29  Transition of [facility]) CM/SW Contact:  Lia Hopping, [place] Phone Number: 05/04/2019, 9:25 AM   Clinical Narrative:   Patient notified CSW upon arrival, he wanted [place] to manage his physical therapy at home. Patient provided choice. CSW confirm with Rep. Lauren confirm staff availability, PT is scheduled to see the patient tomorrow.  RW and 3 in 1 ordered and delivered by [employer].   Final next level of care: [place] Barriers to Discharge: No Barriers Identified   Patient Goals and CMS Choice Patient states their goals for this hospitalization and ongoing recovery are:: Do therapy with [facility] CMS Medicare.gov Compare Post Acute Care list provided to:: Patient Choice offered to / list presented to : Patient  Discharge Placement                       Discharge Plan and Services                DME Arranged: 3-N-1, [place] [facility] DME Agency: [employer] Date DME Agency Contacted: 05/04/19 Time DME Agency Contacted: [phone] Representative spoke with at DME Agency: Ovid Curd HH Arranged: PT [place]: [place] Date [place]: 05/04/19 Time [place]: [phone] Representative spoke with at [place]: [place] ([place]) Interventions     Readmission Risk Interventions No flowsheet data found.

## 2019-05-04 NOTE — Plan of Care (Signed)
  Problem: Education: Goal: Knowledge of General Education information will improve Description: Including pain rating scale, medication(s)/side effects and non-pharmacologic comfort measures Outcome: Progressing   Problem: Health Behavior/Discharge Planning: Goal: Ability to manage health-related needs will improve Outcome: Progressing   Problem: Clinical Measurements: Goal: Ability to maintain clinical measurements within normal limits will improve Outcome: Progressing Goal: Will remain free from infection Outcome: Progressing Goal: Diagnostic test results will improve Outcome: Progressing Goal: Respiratory complications will improve Outcome: Progressing Goal: Cardiovascular complication will be avoided Outcome: Progressing   Problem: Activity: Goal: Risk for activity intolerance will decrease Outcome: Progressing   Problem: Nutrition: Goal: Adequate nutrition will be maintained Outcome: Progressing   Problem: Coping: Goal: Level of anxiety will decrease Outcome: Progressing   Problem: Elimination: Goal: Will not experience complications related to bowel motility Outcome: Progressing Goal: Will not experience complications related to urinary retention Outcome: Progressing   Problem: Coping: Goal: Level of anxiety will decrease Outcome: Progressing   Problem: Pain Managment: Goal: General experience of comfort will improve Outcome: Progressing   Problem: Safety: Goal: Ability to remain free from injury will improve Outcome: Progressing   Problem: Skin Integrity: Goal: Risk for impaired skin integrity will decrease Outcome: Progressing   Problem: Skin Integrity: Goal: Risk for impaired skin integrity will decrease Outcome: Progressing   Problem: Education: Goal: Knowledge of the prescribed therapeutic regimen will improve Outcome: Progressing Goal: Individualized Educational Video(s) Outcome: Progressing   Problem: Activity: Goal: Ability to avoid  complications of mobility impairment will improve Outcome: Progressing Goal: Range of joint motion will improve Outcome: Progressing   Problem: Pain Management: Goal: Pain level will decrease with appropriate interventions Outcome: Progressing   Problem: Clinical Measurements: Goal: Postoperative complications will be avoided or minimized Outcome: Progressing   Problem: Skin Integrity: Goal: Will show signs of wound healing Outcome: Progressing

## 2019-05-13 NOTE — Discharge Summary (Signed)
Physician Discharge Summary  Patient ID: Robert Melton MRN: [id number] DOB/AGE: 69-Oct-1952 69 y.o.  Admit date: 05/03/2019 Discharge date: 05/04/2019   Procedures:  Procedure(s) (LRB): TOTAL KNEE ARTHROPLASTY (Right)  Attending Physician:  Dr. Paralee Cancel   Admission Diagnoses:   Right knee primary OA / pain  Discharge Diagnoses:  Principal Problem:   S/P right TKA  Past Medical History:  Diagnosis Date  . Arthritis    RA  . Broken neck ([place]) 2008   fall from ladder  . CAD (coronary artery disease)    a. s/p CABG in 2009 with LIMA-LAD, free RIMA-OM1, and seq SVG-PDA-PLA b. s/p PTCA of 99% stenosis along PDA in 2011 c. cath in 2015 showing severe native CAD with patent grafts except known occlusion of sequential limb of the SVG-PLA  . Chronic, continuous use of opioids   . COPD (chronic obstructive pulmonary disease) ([place])   . GERD (gastroesophageal reflux disease)   . History of carpal tunnel syndrome    Bilateral  . History of Doppler ultrasound 04/13/2006   LEAs; bilat ABIs - no evidence of arterial insuff.; bilat PVRs - normal; irregular non-hemodynamically significant plaque bilat in SFA  . History of echocardiogram 10/29/2009   EF [id number]; LV systolic function normal; mildly sclerotic AV  . Hyperglycemia   . Hyperlipidemia   . Hypertension   . Myocardial infarction ([place]) 04/2014  . Skin cancer    face    HPI:    Robert Melton, 68 y.o. male, has a history of pain and functional disability in the right knee due to arthritis and has failed non-surgical conservative treatments for greater than 12 weeks to includeNSAID's and/or analgesics, corticosteriod injections, viscosupplementation injections and activity modification.  Onset of symptoms was gradual, starting 3 years ago with gradually worsening course since that time. The patient noted prior procedures on the knee to include  arthroscopy on the right knee(s).  Patient currently rates pain in the right knee(s) at  10 out of 10 with activity. Patient has night pain, worsening of pain with activity and weight bearing, pain that interferes with activities of daily living, pain with passive range of motion, crepitus and joint swelling.  Patient has evidence of periarticular osteophytes and joint space narrowing by imaging studies.  There is no active infection.  Risks, benefits and expectations were discussed with the patient.  Risks including but not limited to the risk of anesthesia, blood clots, nerve damage, blood vessel damage, failure of the prosthesis, infection and up to and including death.  Patient understand the risks, benefits and expectations and wishes to proceed with surgery.   PCP: Robert Saunas, MD   Discharged Condition: good  Hospital Course:  Patient underwent the above stated procedure on 05/03/2019. Patient tolerated the procedure well and brought to the recovery room in good condition and subsequently to the floor.  POD #1 BP: 160/76 ; Pulse: 65 ; Temp: 98.2 F (36.8 C) ; Resp: 16 Patient reports pain as mild.  Doing a lot better this am.  Ready to get up and moving.  Wishes to have [place]. Neurovascular intact and incision: dressing C/D/I.   LABS  Basename    HGB     13.3  HCT     40.0    Discharge Exam: General appearance: alert, cooperative and no distress Extremities: Homans sign is negative, no sign of DVT, no edema, redness or tenderness in the calves or thighs and no ulcers, gangrene or trophic changes  Disposition:  Home with follow up in 2 weeks   Follow-up Information    Paralee Cancel, MD. Schedule an appointment as soon as possible for a visit in 2 weeks.   Specialty: Orthopedic Surgery Contact information: [address] [place] [address] [id number]           Discharge Instructions    Call MD / Call 911   Complete by: As directed    If you experience chest pain or shortness of breath, CALL 911 and be transported to the hospital  emergency room.  If you develope a fever above 101 F, pus (white drainage) or increased drainage or redness at the wound, or calf pain, call your surgeon's office.   Change dressing   Complete by: As directed    Maintain surgical dressing until follow up in the clinic. If the edges start to pull up, may reinforce with tape. If the dressing is no longer working, may remove and cover with gauze and tape, but must keep the area dry and clean.  Call with any questions or concerns.   Constipation Prevention   Complete by: As directed    Drink plenty of fluids.  Prune juice may be helpful.  You may use a stool softener, such as Colace (over the counter) 100 mg twice a day.  Use MiraLax (over the counter) for constipation as needed.   Diet - low sodium heart healthy   Complete by: As directed    Discharge instructions   Complete by: As directed    Maintain surgical dressing until follow up in the clinic. If the edges start to pull up, may reinforce with tape. If the dressing is no longer working, may remove and cover with gauze and tape, but must keep the area dry and clean.  Follow up in 2 weeks at [facility]. Call with any questions or concerns.   Increase activity slowly as tolerated   Complete by: As directed    Weight bearing as tolerated with assist device (walker, cane, etc) as directed, use it as long as suggested by your surgeon or therapist, typically at least 4-6 weeks.   TED hose   Complete by: As directed    Use stockings (TED hose) for 2 weeks on both leg(s).  You may remove them at night for sleeping.      Allergies as of 05/04/2019   No Known Allergies     Medication List    STOP taking these medications   acetaminophen 650 MG CR tablet Commonly known as: TYLENOL     TAKE these medications   albuterol 108 (90 Base) MCG/ACT inhaler Commonly known as: VENTOLIN HFA Inhale 2 puffs into the lungs as directed.   amLODipine 10 MG tablet Commonly known as:  NORVASC Take 1 tablet (10 mg total) by mouth daily. KEEP OV.   aspirin EC 81 MG tablet Take 81 mg by mouth daily.   atorvastatin 40 MG tablet Commonly known as: LIPITOR TAKE 1 TABLET BY MOUTH ONCE DAILY   cholecalciferol 25 MCG (1000 UT) tablet Commonly known as: VITAMIN D3 Take 1,000 Units by mouth daily.   clopidogrel 75 MG tablet Commonly known as: PLAVIX Take 1 tablet (75 mg total) by mouth daily.   cyclobenzaprine 5 MG tablet Commonly known as: FLEXERIL Take 1 tablet (5 mg total) by mouth 3 (three) times daily as needed for muscle spasms.   docusate sodium 100 MG capsule Commonly known as: Colace Take 1 capsule (100 mg total) by mouth  2 (two) times daily. What changed: when to take this   ferrous sulfate 325 (65 FE) MG tablet Commonly known as: FerrouSul Take 1 tablet (325 mg total) by mouth 3 (three) times daily with meals for 14 days.   gabapentin 300 MG capsule Commonly known as: NEURONTIN Take 300 mg by mouth at bedtime.   HYDROcodone-acetaminophen 7.5-325 MG tablet Commonly known as: Norco Take 1-2 tablets by mouth every 4 (four) hours as needed for moderate pain.   isosorbide mononitrate 120 MG 24 hr tablet Commonly known as: IMDUR Take 1 tablet (120 mg total) by mouth daily.   lisinopril 20 MG tablet Commonly known as: ZESTRIL TAKE 1 TABLET BY MOUTH ONCE DAILY   Magnesium 200 MG Tabs Take 200 mg by mouth daily.   metoprolol tartrate 25 MG tablet Commonly known as: LOPRESSOR Take 1 tablet (25 mg total) by mouth 2 (two) times daily.   nitroGLYCERIN 0.4 MG SL tablet Commonly known as: NITROSTAT Place 0.4 mg under the tongue every 5 (five) minutes as needed for chest pain.   pantoprazole 40 MG tablet Commonly known as: PROTONIX Take 40 mg by mouth daily.   polyethylene glycol 17 g packet Commonly known as: MIRALAX / GLYCOLAX Take 17 g by mouth 2 (two) times daily.   pyridOXINE 100 MG tablet Commonly known as: VITAMIN B-6 Take 100 mg by mouth  daily.   vitamin E 400 UNIT capsule Take 400 Units by mouth daily.            Discharge Care Instructions  (From admission, onward)         Start     Ordered   05/04/19 0000  Change dressing    Comments: Maintain surgical dressing until follow up in the clinic. If the edges start to pull up, may reinforce with tape. If the dressing is no longer working, may remove and cover with gauze and tape, but must keep the area dry and clean.  Call with any questions or concerns.   05/04/19 0751           Signed: [place]. Krystopher Kuenzel   PA-C  05/13/2019, 7:29 PM

## 2019-05-28 ENCOUNTER — Other Ambulatory Visit: Payer: Self-pay | Admitting: Physician Assistant

## 2019-07-04 ENCOUNTER — Other Ambulatory Visit: Payer: Self-pay | Admitting: Student

## 2019-07-04 ENCOUNTER — Other Ambulatory Visit: Payer: Self-pay | Admitting: Cardiovascular Disease

## 2019-08-27 ENCOUNTER — Other Ambulatory Visit: Payer: Self-pay | Admitting: Physician Assistant

## 2019-11-19 ENCOUNTER — Other Ambulatory Visit: Payer: Self-pay | Admitting: Physician Assistant

## 2019-12-18 ENCOUNTER — Telehealth: Payer: Self-pay

## 2019-12-18 NOTE — Telephone Encounter (Signed)
° °  [facility] Pre-operative Risk Assessment    HEARTCARE STAFF: - Please ensure there is not already an duplicate clearance open for this procedure. - Under Visit Info/Reason for Call, type in Other and utilize the format Clearance MM/DD/YY or Clearance TBD. Do not use dashes or single digits. - If request is for dental extraction, please clarify the # of teeth to be extracted.  Request for surgical clearance:  1. What type of surgery is being performed? Left Shoulder Rotator Cuff Repair   2. When is this surgery scheduled? TBD   3. What type of clearance is required (medical clearance vs. Pharmacy clearance to hold med vs. Both)?  BOTH  4. Are there any medications that need to be held prior to surgery and how long? ASA, Plavix   5. Practice name and name of physician performing surgery? Wylie Hail, MD   6. What is the office phone number? [phone]   7.   What is the office fax number? [phone] Attn: Adline Potter  8.   Anesthesia type (None, local, MAC, general) ? Not listed   Jacqulynn Cadet 12/18/2019, 8:37 AM  _________________________________________________________________   (provider comments below)

## 2019-12-19 NOTE — Telephone Encounter (Signed)
Dr. Claiborne Billings -pt with hx of MI and CAD and CABG 2009 then PTCA of the 99% anastomosis stenosis to the PDA in 2011.  Follow up cath stable though chronically occluded sequential limb that had supplied the PLA vessel.  He had low risk nuc study in 04/11/19 for pre op eval with small area of ischemia in the apical inferior and apical region suggestive of distal RCA territory-. He had knee surgery and post op had PE.  He was on Xarelto until July when it was stopped and now  Back on ASA and Plavix.    No chest pain or SOB but my question and only because he had PE, ok to stop asa and plavix for surgery?  This is shoulder surgery thanks.

## 2019-12-21 NOTE — Telephone Encounter (Signed)
Follow up   pts wife is calling to follow up on clearance   Please advise

## 2019-12-24 NOTE — Telephone Encounter (Signed)
1st attempt to reach pt regarding surgical clearance and the need for an appointment. Left a detailed message for pt to call back and schedule

## 2019-12-24 NOTE — Telephone Encounter (Signed)
Okay for clearance.  Okay to hold aspirin for least 5 days prior to procedure.  Check with orthopedist to see if okay to continue aspirin.

## 2019-12-24 NOTE — Telephone Encounter (Signed)
Primary Cardiologist:Thomas Claiborne Billings, MD  Chart reviewed as part of pre-operative protocol coverage. Because of Osias Resnick Leeb's past medical history and time since last visit, he/she will require a follow-up visit in order to better assess preoperative cardiovascular risk.  Pre-op covering staff: - Please schedule appointment and call patient to inform them. - Please contact requesting surgeon's office via preferred method (i.e, phone, fax) to inform them of need for appointment prior to surgery.  If applicable, this message will also be routed to pharmacy pool and/or primary cardiologist for input on holding anticoagulant/antiplatelet agent as requested below so that this information is available at time of patient's appointment.   Deberah Pelton, NP  12/24/2019, 2:02 PM

## 2019-12-24 NOTE — Telephone Encounter (Signed)
Pt has been scheduled to see Almyra Deforest, PA-C, 8/27 and clearance will be addressed at that time.  Will route to the requesting surgeon's office to make them aware.

## 2020-01-11 ENCOUNTER — Encounter: Payer: Self-pay | Admitting: Physician Assistant

## 2020-01-11 ENCOUNTER — Ambulatory Visit: Payer: Medicare Other | Admitting: Physician Assistant

## 2020-01-11 ENCOUNTER — Other Ambulatory Visit: Payer: Self-pay

## 2020-01-11 VITALS — BP 128/62 | HR 58 | Ht 63.0 in | Wt 174.0 lb

## 2020-01-11 DIAGNOSIS — I25810 Atherosclerosis of coronary artery bypass graft(s) without angina pectoris: Secondary | ICD-10-CM | POA: Diagnosis not present

## 2020-01-11 DIAGNOSIS — Z01810 Encounter for preprocedural cardiovascular examination: Secondary | ICD-10-CM | POA: Diagnosis not present

## 2020-01-11 DIAGNOSIS — E785 Hyperlipidemia, unspecified: Secondary | ICD-10-CM | POA: Diagnosis not present

## 2020-01-11 DIAGNOSIS — I2782 Chronic pulmonary embolism: Secondary | ICD-10-CM

## 2020-01-11 DIAGNOSIS — I10 Essential (primary) hypertension: Secondary | ICD-10-CM | POA: Diagnosis not present

## 2020-01-11 NOTE — Progress Notes (Signed)
   Primary Cardiologist: Shelva Majestic, MD  [name] (DOB [birth year]) was seen in the cardiology office on 01/11/2020 for preoperative clearance. He denies any recent chest pain or shortness of breath. Previous Myoview in 03/2019 was low risk. He is stable from cardiac perspective for shoulder surgery. He may hold Plavix for 5 days prior to the procedure and restart as soon as possible after the surgery at the surgeon's discretion. We recommend him to continue on the aspirin through the surgery if at all possible given his significant cardiac history. However if absolutely need to come off aspirin for the surgery, he may hold aspirin for 7 days prior to the procedure and restart after the procedure.   Please call us for any questions.   Thank you    Almyra Deforest, PA 01/11/2020, 11:26 AM

## 2020-01-11 NOTE — Progress Notes (Signed)
Cardiology Office Note:    Date:  01/13/2020   ID:  Robert Melton, DOB [birth year], MRN [id number]  PCP:  Coy Saunas, MD  [facility] Cardiologist:  Shelva Majestic, MD  [place] Electrophysiologist:  None   Referring MD: Coy Saunas, MD   Chief Complaint  Patient presents with   Pre-op Exam    seen for Dr. Claiborne Billings. Upcoming shoulder surgery    History of Present Illness:    Robert Melton is a 69 y.o. male with a hx of CAD s/p CABG 2009 (LIMA-LAD, free RIMA-OM1, seq SVG-PDA-PLA), HTN, HLD, and RA.  Patient had PTCA of PDA in 2011.  Cardiac catheterization in 2015 showed severe native CAD with patent grafts except for known occlusion of sequential limb of SVG to PLA.  Patient was last seen by Dr. Claiborne Billings in November 2020 at which time he was doing well however had significant hip issue.  Myoview obtained on 04/11/2019 showed EF 51%, small defect of mild severity present in the apical inferior and apex location consistent with small region of ischemia in the distal RCA, overall low risk study.  This was reviewed by Dr. Claiborne Billings who cleared the patient for the knee surgery.  Postop course was complicated by PE and he was placed on Xarelto for 6 months and that this was discontinued in July 2021.  Patient presents today for preoperative clearance.  He denies any recent chest pain or shortness of breath.  He has no lower extremity edema, orthopnea or PND.  Given the low risk Myoview he had in November 2020, he is stable enough from cardiac perspective to proceed with the low risk procedure.  He may hold Plavix for 5 days prior to surgery and restart as soon as possible afterward.  Ideally, we would prefer him to stay on the aspirin through the surgery given his significant cardiac issue.  However if absolutely need to come off the aspirin, he may hold aspirin for 7 days prior to the surgery and restart as soon as possible after the surgery at the surgeon's discretion.  Otherwise he can  follow-up with Dr. Claiborne Billings in 6 months.  I will defer the repeat lipid panel to his primary care provider.   Past Medical History:  Diagnosis Date   Arthritis    RA   Broken neck ([place]) 2008   fall from ladder   CAD (coronary artery disease)    a. s/p CABG in 2009 with LIMA-LAD, free RIMA-OM1, and seq SVG-PDA-PLA b. s/p PTCA of 99% stenosis along PDA in 2011 c. cath in 2015 showing severe native CAD with patent grafts except known occlusion of sequential limb of the SVG-PLA   Chronic, continuous use of opioids    COPD (chronic obstructive pulmonary disease) (HCC)    GERD (gastroesophageal reflux disease)    History of carpal tunnel syndrome    Bilateral   History of Doppler ultrasound 04/13/2006   LEAs; bilat ABIs - no evidence of arterial insuff.; bilat PVRs - normal; irregular non-hemodynamically significant plaque bilat in SFA   History of echocardiogram 10/29/2009   EF 93-71%; LV systolic function normal; mildly sclerotic AV   Hyperglycemia    Hyperlipidemia    Hypertension    Myocardial infarction ([place]) 04/2014   Skin cancer    face    Past Surgical History:  Procedure Laterality Date   CARDIAC CATHETERIZATION  10/24/2009   r/t CP; significant native CAD, patent LIMA graft to LAD & patent RIMA to obtuse  marginal vessel; graft to RCA 99% stenosed at anastomses; stenting to RCA reduced to 20-30%   CARDIAC CATHETERIZATION  04/30/2014   CARPAL TUNNEL RELEASE  2000   COLONOSCOPY     CORONARY ANGIOPLASTY     CORONARY ARTERY BYPASS GRAFT  2009   revascularization by Dr. Koleen Nimrod; LIMA to LAD, free RIMA graft to OM1, sequential vein gradt to PDA & PL segment   KNEE SURGERY     Left Knee (1992), Right Knee (1998);    LEFT HEART CATHETERIZATION WITH CORONARY/GRAFT ANGIOGRAM N/A 11/29/2012   Procedure: LEFT HEART CATHETERIZATION WITH Beatrix Fetters;  Surgeon: Troy Sine, MD;  Location: [facility] CATH LAB;  Service: Cardiovascular;  Laterality: N/A;   LEFT  HEART CATHETERIZATION WITH CORONARY/GRAFT ANGIOGRAM N/A 04/30/2014   Procedure: LEFT HEART CATHETERIZATION WITH Beatrix Fetters;  Surgeon: Sinclair Grooms, MD;  Location: [facility] CATH LAB;  Service: Cardiovascular;  Laterality: N/A;   LUMBAR SPINE SURGERY  2004   s/p fusion L4-S1; 4 back surgeries   neck fusion  [place]   face   TOTAL KNEE ARTHROPLASTY Right 05/03/2019   Procedure: TOTAL KNEE ARTHROPLASTY;  Surgeon: Paralee Cancel, MD;  Location: [facility] ORS;  Service: Orthopedics;  Laterality: Right;  70 mins    Current Medications: Current Meds  Medication Sig   traMADol (ULTRAM) 50 MG tablet Take 50 mg by mouth every 6 (six) hours.     Allergies:   Patient has no known allergies.   Social History   Socioeconomic History   Marital status: Married    Spouse name: Not on file   Number of children: Not on file   Years of education: Not on file   Highest education level: Not on file  Occupational History   Not on file  Tobacco Use   Smoking status: Current Every Day Smoker    Packs/day: 0.50    Years: 30.00    Pack years: 15.00    Types: Cigarettes   Smokeless tobacco: Never Used   Tobacco comment: 3-4 cigarettes per day  Vaping Use   Vaping Use: Never used  Substance and Sexual Activity   Alcohol use: No   Drug use: No   Sexual activity: Not on file  Other Topics Concern   Not on file  Social History Narrative   Not on file   Social Determinants of Health   Financial Resource Strain:    Difficulty of Paying Living Expenses: Not on file  Food Insecurity:    Worried About [place] in the Last Year: Not on file   Ran Out of Food in the Last Year: Not on file  Transportation Needs:    Lack of Transportation (Medical): Not on file   Lack of Transportation (Non-Medical): Not on file  Physical Activity:    Days of Exercise per Week: Not on file   Minutes of Exercise per  Session: Not on file  Stress:    Feeling of Stress : Not on file  Social Connections:    Frequency of Communication with Friends and Family: Not on file   Frequency of Social Gatherings with Friends and Family: Not on file   Attends Religious Services: Not on file   Active Member of Clubs or Organizations: Not on file   Attends Archivist Meetings: Not on file   Marital Status: Not on file     Family History: The patient's family history includes  Heart attack in his mother; Heart disease in his father and mother; Hyperlipidemia in his mother and sister; Stroke in his father.  ROS:   Please see the history of present illness.     All other systems reviewed and are negative.  EKGs/Labs/Other Studies Reviewed:    The following studies were reviewed today:  Myoview 04/11/2019  The left ventricular ejection fraction is mildly decreased (45-54%).  Nuclear stress EF: 51%.  This is a low risk study.  Defect 1: There is a small defect of mild severity present in the apical inferior and apex location.  Findings consistent with a small region of ischemia in the distal RCA territory.   EKG:  EKG is ordered today.  The ekg ordered today demonstrates normal sinus rhythm, nonspecific T wave changes.  Recent Labs: 04/03/2019: ALT 19; TSH 2.680 05/04/2019: BUN 25; Creatinine, Ser 1.05; Hemoglobin 13.3; Platelets 173; Potassium 4.7; Sodium 135  Recent Lipid Panel    Component Value Date/Time   CHOL 135 04/03/2019 0941   TRIG 114 04/03/2019 0941   HDL 32 (L) 04/03/2019 0941   CHOLHDL 4.2 04/03/2019 0941   CHOLHDL 4.9 09/02/2014 0803   VLDL 49 (H) 09/02/2014 0803   LDLCALC 82 04/03/2019 0941    Physical Exam:    VS:  BP 128/62    Pulse (!) 58    Ht 5\' 3"  (1.6 m)    Wt 174 lb (78.9 kg)    SpO2 97%    BMI 30.82 kg/m     Wt Readings from Last 3 Encounters:  01/11/20 174 lb (78.9 kg)  05/03/19 173 lb 3.2 oz (78.6 kg)  04/30/19 173 lb 3.2 oz (78.6 kg)     GEN:   Well nourished, well developed in no acute distress HEENT: Normal NECK: No JVD; No carotid bruits LYMPHATICS: No lymphadenopathy CARDIAC: RRR, no murmurs, rubs, gallops RESPIRATORY:  Clear to auscultation without rales, wheezing or rhonchi  ABDOMEN: Soft, non-tender, non-distended MUSCULOSKELETAL:  No edema; No deformity  SKIN: Warm and dry NEUROLOGIC:  Alert and oriented x 3 PSYCHIATRIC:  Normal affect   ASSESSMENT:    1. Preop cardiovascular exam   2. Coronary artery disease involving coronary bypass graft of native heart without angina pectoris   3. Essential hypertension   4. Hyperlipidemia LDL goal <70   5. Other chronic pulmonary embolism without acute cor pulmonale (HCC)    PLAN:    In order of problems listed above:  1. Preoperative clearance: Previous Myoview obtained in November 2020 was low risk.  He denies any recent chest pain or shortness of breath.  He is cleared to proceed with shoulder surgery.  He will need to hold Plavix for 5 days prior to the surgery and restart aspirin as possible afterward, ideally we would prefer he continue on the aspirin through the surgery, however if absolutely needed, he may hold aspirin for 7 days prior to the surgery.  2. CAD: Denies any chest pain.  Last Myoview in November 2020 was low risk  3. Hypertension: Blood pressure stable  4. Hyperlipidemia: On Lipitor  5. History of PE: Occurred last year after knee surgery.  He completed 49-month of Xarelto.   Medication Adjustments/Labs and Tests Ordered: Current medicines are reviewed at length with the patient today.  Concerns regarding medicines are outlined above.  Orders Placed This Encounter  Procedures   EKG 12-Lead   No orders of the defined types were placed in this encounter.   Patient Instructions  Medication Instructions:  Your physician recommends that you continue on your current medications as directed. Please refer to the Current Medication list given to you  today.  *If you need a refill on your cardiac medications before your next appointment, please call your pharmacy*   Lab Work: none If you have labs (blood work) drawn today and your tests are completely normal, you will receive your results only by:  [place] (if you have MyChart) OR  A paper copy in the mail If you have any lab test that is abnormal or we need to change your treatment, we will call you to review the results.   Testing/Procedures: none   Follow-Up: At [facility], you and your health needs are our priority.  As part of our continuing mission to provide you with exceptional heart care, we have created designated Provider Care Teams.  These Care Teams include your primary Cardiologist (physician) and Advanced Practice Providers (APPs -  Physician Assistants and Nurse Practitioners) who all work together to provide you with the care you need, when you need it.  We recommend signing up for the patient portal called "MyChart".  Sign up information is provided on this After Visit Summary.  MyChart is used to connect with patients for Virtual Visits (Telemedicine).  Patients are able to view lab/test results, encounter notes, upcoming appointments, etc.  Non-urgent messages can be sent to your provider as well.   To learn more about what you can do with MyChart, go to [email or web address].    Your next appointment:   6 month(s)  The format for your next appointment:   In Person  Provider:   Shelva Majestic, MD   Other Instructions      Signed, Almyra Deforest, [place]  01/13/2020 11:34 PM    [place]

## 2020-01-11 NOTE — Patient Instructions (Signed)
Medication Instructions:  Your physician recommends that you continue on your current medications as directed. Please refer to the Current Medication list given to you today.  *If you need a refill on your cardiac medications before your next appointment, please call your pharmacy*   Lab Work: none If you have labs (blood work) drawn today and your tests are completely normal, you will receive your results only by: Marland Kitchen MyChart Message (if you have MyChart) OR . A paper copy in the mail If you have any lab test that is abnormal or we need to change your treatment, we will call you to review the results.   Testing/Procedures: none   Follow-Up: At [facility], you and your health needs are our priority.  As part of our continuing mission to provide you with exceptional heart care, we have created designated Provider Care Teams.  These Care Teams include your primary Cardiologist (physician) and Advanced Practice Providers (APPs -  Physician Assistants and Nurse Practitioners) who all work together to provide you with the care you need, when you need it.  We recommend signing up for the patient portal called "MyChart".  Sign up information is provided on this After Visit Summary.  MyChart is used to connect with patients for Virtual Visits (Telemedicine).  Patients are able to view lab/test results, encounter notes, upcoming appointments, etc.  Non-urgent messages can be sent to your provider as well.   To learn more about what you can do with MyChart, go to [email or web address].    Your next appointment:   6 month(s)  The format for your next appointment:   In Person  Provider:   Shelva Majestic, MD   Other Instructions

## 2020-01-13 ENCOUNTER — Encounter: Payer: Self-pay | Admitting: Physician Assistant

## 2020-01-16 ENCOUNTER — Telehealth: Payer: Self-pay | Admitting: Physician Assistant

## 2020-01-16 NOTE — Telephone Encounter (Signed)
     Kristin from pt's pcp, pt new pcp NP Raquel Sarna Dolleschel wanted to get copy of pt's clearance for his upcoming procedure with [facility], she gave fax# [phone]

## 2020-02-19 DIAGNOSIS — Z7901 Long term (current) use of anticoagulants: Secondary | ICD-10-CM | POA: Diagnosis not present

## 2020-02-19 DIAGNOSIS — Z01818 Encounter for other preprocedural examination: Secondary | ICD-10-CM | POA: Diagnosis not present

## 2020-02-19 DIAGNOSIS — Z86711 Personal history of pulmonary embolism: Secondary | ICD-10-CM | POA: Diagnosis not present

## 2020-03-05 ENCOUNTER — Other Ambulatory Visit: Payer: Self-pay | Admitting: Cardiovascular Disease

## 2020-04-22 ENCOUNTER — Inpatient Hospital Stay (HOSPITAL_COMMUNITY)
Admission: EM | Admit: 2020-04-22 | Discharge: 2020-04-24 | DRG: 964 | Disposition: A | Discharge status: Home / Self Care | Payer: Medicare Other | Attending: Physician Assistant | Admitting: Physician Assistant

## 2020-04-22 ENCOUNTER — Emergency Department (HOSPITAL_COMMUNITY): Payer: Medicare Other

## 2020-04-22 ENCOUNTER — Encounter (HOSPITAL_COMMUNITY): Payer: Self-pay | Admitting: Emergency Medicine

## 2020-04-22 DIAGNOSIS — S12110A Anterior displaced Type II dens fracture, initial encounter for closed fracture: Secondary | ICD-10-CM | POA: Diagnosis present

## 2020-04-22 DIAGNOSIS — H9203 Otalgia, bilateral: Secondary | ICD-10-CM | POA: Diagnosis present

## 2020-04-22 DIAGNOSIS — K219 Gastro-esophageal reflux disease without esophagitis: Secondary | ICD-10-CM | POA: Diagnosis present

## 2020-04-22 DIAGNOSIS — Z7902 Long term (current) use of antithrombotics/antiplatelets: Secondary | ICD-10-CM

## 2020-04-22 DIAGNOSIS — D62 Acute posthemorrhagic anemia: Secondary | ICD-10-CM | POA: Diagnosis present

## 2020-04-22 DIAGNOSIS — S065X9A Traumatic subdural hemorrhage with loss of consciousness of unspecified duration, initial encounter: Principal | ICD-10-CM | POA: Diagnosis present

## 2020-04-22 DIAGNOSIS — E785 Hyperlipidemia, unspecified: Secondary | ICD-10-CM | POA: Diagnosis present

## 2020-04-22 DIAGNOSIS — J449 Chronic obstructive pulmonary disease, unspecified: Secondary | ICD-10-CM | POA: Diagnosis present

## 2020-04-22 DIAGNOSIS — Z7982 Long term (current) use of aspirin: Secondary | ICD-10-CM

## 2020-04-22 DIAGNOSIS — I10 Essential (primary) hypertension: Secondary | ICD-10-CM | POA: Diagnosis present

## 2020-04-22 DIAGNOSIS — Y92410 Unspecified street and highway as the place of occurrence of the external cause: Secondary | ICD-10-CM | POA: Diagnosis not present

## 2020-04-22 DIAGNOSIS — Z20822 Contact with and (suspected) exposure to covid-19: Secondary | ICD-10-CM | POA: Diagnosis present

## 2020-04-22 DIAGNOSIS — Z96651 Presence of right artificial knee joint: Secondary | ICD-10-CM | POA: Diagnosis present

## 2020-04-22 DIAGNOSIS — Z823 Family history of stroke: Secondary | ICD-10-CM

## 2020-04-22 DIAGNOSIS — F17210 Nicotine dependence, cigarettes, uncomplicated: Secondary | ICD-10-CM | POA: Diagnosis present

## 2020-04-22 DIAGNOSIS — S069X9A Unspecified intracranial injury with loss of consciousness of unspecified duration, initial encounter: Secondary | ICD-10-CM | POA: Diagnosis present

## 2020-04-22 DIAGNOSIS — Z9861 Coronary angioplasty status: Secondary | ICD-10-CM | POA: Diagnosis not present

## 2020-04-22 DIAGNOSIS — R739 Hyperglycemia, unspecified: Secondary | ICD-10-CM | POA: Diagnosis present

## 2020-04-22 DIAGNOSIS — M069 Rheumatoid arthritis, unspecified: Secondary | ICD-10-CM | POA: Diagnosis present

## 2020-04-22 DIAGNOSIS — G8929 Other chronic pain: Secondary | ICD-10-CM | POA: Diagnosis present

## 2020-04-22 DIAGNOSIS — S36031A Moderate laceration of spleen, initial encounter: Secondary | ICD-10-CM | POA: Diagnosis present

## 2020-04-22 DIAGNOSIS — Z85828 Personal history of other malignant neoplasm of skin: Secondary | ICD-10-CM

## 2020-04-22 DIAGNOSIS — Z83438 Family history of other disorder of lipoprotein metabolism and other lipidemia: Secondary | ICD-10-CM

## 2020-04-22 DIAGNOSIS — M545 Low back pain, unspecified: Secondary | ICD-10-CM | POA: Diagnosis present

## 2020-04-22 DIAGNOSIS — I2510 Atherosclerotic heart disease of native coronary artery without angina pectoris: Secondary | ICD-10-CM | POA: Diagnosis present

## 2020-04-22 DIAGNOSIS — Z951 Presence of aortocoronary bypass graft: Secondary | ICD-10-CM | POA: Diagnosis not present

## 2020-04-22 DIAGNOSIS — S069XAA Unspecified intracranial injury with loss of consciousness status unknown, initial encounter: Secondary | ICD-10-CM | POA: Diagnosis present

## 2020-04-22 DIAGNOSIS — I252 Old myocardial infarction: Secondary | ICD-10-CM | POA: Diagnosis not present

## 2020-04-22 DIAGNOSIS — Z79899 Other long term (current) drug therapy: Secondary | ICD-10-CM

## 2020-04-22 DIAGNOSIS — Z8249 Family history of ischemic heart disease and other diseases of the circulatory system: Secondary | ICD-10-CM | POA: Diagnosis not present

## 2020-04-22 DIAGNOSIS — R079 Chest pain, unspecified: Secondary | ICD-10-CM

## 2020-04-22 LAB — COMPREHENSIVE METABOLIC PANEL
ALT: 16 U/L (ref 0–44)
AST: 20 U/L (ref 15–41)
Albumin: 3.3 g/dL — ABNORMAL LOW (ref 3.5–5.0)
Alkaline Phosphatase: 78 U/L (ref 38–126)
Anion gap: 9 (ref 5–15)
BUN: 10 mg/dL (ref 8–23)
CO2: 23 mmol/L (ref 22–32)
Calcium: 8.9 mg/dL (ref 8.9–10.3)
Chloride: 104 mmol/L (ref 98–111)
Creatinine, Ser: 1.01 mg/dL (ref 0.61–1.24)
GFR, Estimated: >60 mL/min (ref >60)
Glucose, Bld: 144 mg/dL — ABNORMAL HIGH (ref 70–99)
Potassium: 3.5 mmol/L (ref 3.5–5.1)
Sodium: 136 mmol/L (ref 135–145)
Total Bilirubin: 0.5 mg/dL (ref 0.3–1.2)
Total Protein: 5.8 g/dL — ABNORMAL LOW (ref 6.5–8.1)

## 2020-04-22 LAB — CBC
HCT: 38.9 % — ABNORMAL LOW (ref 39.0–52.0)
HCT: 39.1 % (ref 39.0–52.0)
Hemoglobin: 12.3 g/dL — ABNORMAL LOW (ref 13.0–17.0)
Hemoglobin: 12.7 g/dL — ABNORMAL LOW (ref 13.0–17.0)
MCH: 28.4 pg (ref 26.0–34.0)
MCH: 28.7 pg (ref 26.0–34.0)
MCHC: 31.6 g/dL (ref 30.0–36.0)
MCHC: 32.5 g/dL (ref 30.0–36.0)
MCV: 89.8 fL (ref 80.0–100.0)
MCV: 88.3 fL (ref 80.0–100.0)
Platelets: 185 10*3/uL (ref 150–400)
Platelets: 167 10*3/uL (ref 150–400)
RBC: 4.33 MIL/uL (ref 4.22–5.81)
RBC: 4.43 MIL/uL (ref 4.22–5.81)
RDW: 14.6 % (ref 11.5–15.5)
RDW: 14.6 % (ref 11.5–15.5)
WBC: 9.8 10*3/uL (ref 4.0–10.5)
WBC: 12.4 10*3/uL — ABNORMAL HIGH (ref 4.0–10.5)
nRBC: 0 % (ref 0.0–0.2)
nRBC: 0 % (ref 0.0–0.2)

## 2020-04-22 LAB — LACTIC ACID, PLASMA
Lactic Acid, Venous: 2.3 mmol/L (ref 0.5–1.9)
Lactic Acid, Venous: 2.3 mmol/L (ref 0.5–1.9)
Lactic Acid, Venous: 2.1 mmol/L (ref 0.5–1.9)

## 2020-04-22 LAB — HIV ANTIBODY (ROUTINE TESTING W REFLEX): HIV Screen 4th Generation wRfx: NONREACTIVE

## 2020-04-22 LAB — I-STAT CHEM 8, ED
BUN: 11 mg/dL (ref 8–23)
Calcium, Ion: 1.17 mmol/L (ref 1.15–1.40)
Chloride: 104 mmol/L (ref 98–111)
Creatinine, Ser: 1 mg/dL (ref 0.61–1.24)
Glucose, Bld: 135 mg/dL — ABNORMAL HIGH (ref 70–99)
HCT: 37 % — ABNORMAL LOW (ref 39.0–52.0)
Hemoglobin: 12.6 g/dL — ABNORMAL LOW (ref 13.0–17.0)
Potassium: 3.6 mmol/L (ref 3.5–5.1)
Sodium: 140 mmol/L (ref 135–145)
TCO2: 22 mmol/L (ref 22–32)

## 2020-04-22 LAB — RESP PANEL BY RT-PCR (FLU A&B, COVID) ARPGX2
Influenza A by PCR: NEGATIVE
Influenza B by PCR: NEGATIVE
SARS Coronavirus 2 by RT PCR: NEGATIVE

## 2020-04-22 LAB — URINALYSIS, ROUTINE W REFLEX MICROSCOPIC
Bilirubin Urine: NEGATIVE
Glucose, UA: NEGATIVE mg/dL
Ketones, ur: NEGATIVE mg/dL
Leukocytes,Ua: NEGATIVE
Nitrite: NEGATIVE
Protein, ur: NEGATIVE mg/dL
Specific Gravity, Urine: >1.046 — ABNORMAL HIGH (ref 1.005–1.030)
pH: 5 (ref 5.0–8.0)

## 2020-04-22 LAB — PROTIME-INR
INR: 1.1 (ref 0.8–1.2)
Prothrombin Time: 13.3 seconds (ref 11.4–15.2)

## 2020-04-22 LAB — SAMPLE TO BLOOD BANK

## 2020-04-22 LAB — MRSA PCR SCREENING: MRSA by PCR: NEGATIVE

## 2020-04-22 LAB — ETHANOL: Alcohol, Ethyl (B): <10 mg/dL (ref <10)

## 2020-04-22 MED ORDER — ACETAMINOPHEN 500 MG PO TABS
1000.0000 mg | ORAL_TABLET | Freq: Four times a day (QID) | ORAL | Status: DC
  Administered 2020-04-23 – 2020-04-24 (×7): 1000 mg via ORAL
  Filled 2020-04-22 (×7): qty 2

## 2020-04-22 MED ORDER — GABAPENTIN 300 MG PO CAPS
300.0000 mg | ORAL_CAPSULE | Freq: Every day | ORAL | Status: DC
  Administered 2020-04-22 – 2020-04-23 (×2): 300 mg via ORAL
  Filled 2020-04-22 (×2): qty 1

## 2020-04-22 MED ORDER — SODIUM CHLORIDE 0.9 % IV SOLN: INTRAVENOUS | Status: DC

## 2020-04-22 MED ORDER — DOCUSATE SODIUM 100 MG PO CAPS
100.0000 mg | ORAL_CAPSULE | Freq: Two times a day (BID) | ORAL | Status: DC
  Administered 2020-04-22 – 2020-04-24 (×4): 100 mg via ORAL
  Filled 2020-04-22 (×4): qty 1

## 2020-04-22 MED ORDER — ISOSORBIDE MONONITRATE ER 30 MG PO TB24
120.0000 mg | ORAL_TABLET | Freq: Every day | ORAL | Status: DC
  Administered 2020-04-23 – 2020-04-24 (×2): 120 mg via ORAL
  Filled 2020-04-22 (×2): qty 4

## 2020-04-22 MED ORDER — METOPROLOL TARTRATE 5 MG/5ML IV SOLN: 5.0000 mg | Freq: Four times a day (QID) | INTRAVENOUS | Status: DC | PRN

## 2020-04-22 MED ORDER — HYDROMORPHONE HCL 1 MG/ML IJ SOLN
0.5000 mg | INTRAMUSCULAR | Status: DC | PRN
  Administered 2020-04-23: 0.5 mg via INTRAVENOUS
  Filled 2020-04-22: qty 1

## 2020-04-22 MED ORDER — ALBUTEROL SULFATE HFA 108 (90 BASE) MCG/ACT IN AERS
2.0000 | INHALATION_SPRAY | RESPIRATORY_TRACT | Status: DC
  Filled 2020-04-22: qty 6.7

## 2020-04-22 MED ORDER — MORPHINE SULFATE (PF) 4 MG/ML IV SOLN
4.0000 mg | Freq: Once | INTRAVENOUS | Status: AC
  Administered 2020-04-22: 4 mg via INTRAVENOUS
  Filled 2020-04-22: qty 1

## 2020-04-22 MED ORDER — OXYCODONE HCL 5 MG PO TABS
5.0000 mg | ORAL_TABLET | ORAL | Status: DC | PRN
  Administered 2020-04-22 – 2020-04-23 (×2): 5 mg via ORAL
  Filled 2020-04-22 (×2): qty 1

## 2020-04-22 MED ORDER — METHOCARBAMOL 1000 MG/10ML IJ SOLN
500.0000 mg | Freq: Four times a day (QID) | INTRAVENOUS | Status: DC | PRN
  Administered 2020-04-23: 500 mg via INTRAVENOUS
  Filled 2020-04-22 (×3): qty 5

## 2020-04-22 MED ORDER — LEVETIRACETAM IN NACL 500 MG/100ML IV SOLN
500.0000 mg | Freq: Two times a day (BID) | INTRAVENOUS | Status: DC
  Administered 2020-04-23: 500 mg via INTRAVENOUS
  Filled 2020-04-22 (×4): qty 100

## 2020-04-22 MED ORDER — ONDANSETRON 4 MG PO TBDP: 4.0000 mg | ORAL_TABLET | Freq: Four times a day (QID) | ORAL | Status: DC | PRN

## 2020-04-22 MED ORDER — BISACODYL 10 MG RE SUPP: 10.0000 mg | Freq: Every day | RECTAL | Status: DC | PRN

## 2020-04-22 MED ORDER — AMLODIPINE BESYLATE 5 MG PO TABS
10.0000 mg | ORAL_TABLET | Freq: Every day | ORAL | Status: DC
  Administered 2020-04-23 – 2020-04-24 (×2): 10 mg via ORAL
  Filled 2020-04-22 (×2): qty 2

## 2020-04-22 MED ORDER — CHLORHEXIDINE GLUCONATE CLOTH 2 % EX PADS
6.0000 | MEDICATED_PAD | Freq: Every day | CUTANEOUS | Status: DC
  Administered 2020-04-22 – 2020-04-23 (×2): 6 via TOPICAL

## 2020-04-22 MED ORDER — ONDANSETRON HCL 4 MG/2ML IJ SOLN: 4.0000 mg | Freq: Four times a day (QID) | INTRAMUSCULAR | Status: DC | PRN

## 2020-04-22 MED ORDER — PANTOPRAZOLE SODIUM 40 MG PO TBEC
40.0000 mg | DELAYED_RELEASE_TABLET | Freq: Every day | ORAL | Status: DC
  Administered 2020-04-23 – 2020-04-24 (×2): 40 mg via ORAL
  Filled 2020-04-22 (×2): qty 1

## 2020-04-22 MED ORDER — IOHEXOL 350 MG/ML SOLN
100.0000 mL | Freq: Once | INTRAVENOUS | Status: AC | PRN
  Administered 2020-04-22: 100 mL via INTRAVENOUS

## 2020-04-22 MED ORDER — NITROGLYCERIN 0.4 MG SL SUBL: 0.4000 mg | SUBLINGUAL_TABLET | SUBLINGUAL | Status: DC | PRN

## 2020-04-22 MED ORDER — MORPHINE SULFATE (PF) 2 MG/ML IV SOLN
2.0000 mg | Freq: Once | INTRAVENOUS | Status: AC
  Administered 2020-04-22: 2 mg via INTRAVENOUS
  Filled 2020-04-22: qty 1

## 2020-04-22 MED ORDER — HYDRALAZINE HCL 20 MG/ML IJ SOLN: 10.0000 mg | INTRAMUSCULAR | Status: DC | PRN

## 2020-04-22 MED ORDER — OXYCODONE HCL 5 MG PO TABS
10.0000 mg | ORAL_TABLET | ORAL | Status: DC | PRN
  Administered 2020-04-23 – 2020-04-24 (×7): 10 mg via ORAL
  Filled 2020-04-22 (×7): qty 2

## 2020-04-22 MED ORDER — METOPROLOL TARTRATE 25 MG PO TABS
25.0000 mg | ORAL_TABLET | Freq: Two times a day (BID) | ORAL | Status: DC
  Administered 2020-04-22 – 2020-04-24 (×4): 25 mg via ORAL
  Filled 2020-04-22 (×4): qty 1
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
## 2020-04-22 NOTE — ED Notes (Signed)
Date and time results received: 04/22/20 4:14 PM  Test: lactic acid  Critical Value: 2.3  Name of Provider Notified: horton DO   Orders Received? Or Actions Taken?:
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
## 2020-04-23 ENCOUNTER — Inpatient Hospital Stay (HOSPITAL_COMMUNITY): Payer: Medicare Other

## 2020-04-23 LAB — CBC
HCT: 35.2 % — ABNORMAL LOW (ref 39.0–52.0)
HCT: 37.5 % — ABNORMAL LOW (ref 39.0–52.0)
Hemoglobin: 11.8 g/dL — ABNORMAL LOW (ref 13.0–17.0)
Hemoglobin: 12.1 g/dL — ABNORMAL LOW (ref 13.0–17.0)
MCH: 29.2 pg (ref 26.0–34.0)
MCH: 28.4 pg (ref 26.0–34.0)
MCHC: 33.5 g/dL (ref 30.0–36.0)
MCHC: 32.3 g/dL (ref 30.0–36.0)
MCV: 87.1 fL (ref 80.0–100.0)
MCV: 88 fL (ref 80.0–100.0)
Platelets: 138 10*3/uL — ABNORMAL LOW (ref 150–400)
Platelets: 165 10*3/uL (ref 150–400)
RBC: 4.04 MIL/uL — ABNORMAL LOW (ref 4.22–5.81)
RBC: 4.26 MIL/uL (ref 4.22–5.81)
RDW: 14.7 % (ref 11.5–15.5)
RDW: 14.6 % (ref 11.5–15.5)
WBC: 10.4 10*3/uL (ref 4.0–10.5)
WBC: 11.7 10*3/uL — ABNORMAL HIGH (ref 4.0–10.5)
nRBC: 0 % (ref 0.0–0.2)
nRBC: 0 % (ref 0.0–0.2)

## 2020-04-23 LAB — BASIC METABOLIC PANEL
Anion gap: 9 (ref 5–15)
BUN: 10 mg/dL (ref 8–23)
CO2: 23 mmol/L (ref 22–32)
Calcium: 9.2 mg/dL (ref 8.9–10.3)
Chloride: 105 mmol/L (ref 98–111)
Creatinine, Ser: 0.86 mg/dL (ref 0.61–1.24)
GFR, Estimated: >60 mL/min (ref >60)
Glucose, Bld: 102 mg/dL — ABNORMAL HIGH (ref 70–99)
Potassium: 4 mmol/L (ref 3.5–5.1)
Sodium: 137 mmol/L (ref 135–145)

## 2020-04-23 MED ORDER — LEVETIRACETAM 500 MG PO TABS
500.0000 mg | ORAL_TABLET | Freq: Two times a day (BID) | ORAL | Status: DC
  Administered 2020-04-23 – 2020-04-24 (×2): 500 mg via ORAL
  Filled 2020-04-23 (×2): qty 1
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
## 2020-04-24 LAB — CBC
HCT: 32.2 % — ABNORMAL LOW (ref 39.0–52.0)
Hemoglobin: 11.2 g/dL — ABNORMAL LOW (ref 13.0–17.0)
MCH: 30.2 pg (ref 26.0–34.0)
MCHC: 34.8 g/dL (ref 30.0–36.0)
MCV: 86.8 fL (ref 80.0–100.0)
Platelets: 140 10*3/uL — ABNORMAL LOW (ref 150–400)
RBC: 3.71 MIL/uL — ABNORMAL LOW (ref 4.22–5.81)
RDW: 14.6 % (ref 11.5–15.5)
WBC: 7.8 10*3/uL (ref 4.0–10.5)
nRBC: 0 % (ref 0.0–0.2)

## 2020-04-24 LAB — BASIC METABOLIC PANEL
Anion gap: 10 (ref 5–15)
BUN: 11 mg/dL (ref 8–23)
CO2: 21 mmol/L — ABNORMAL LOW (ref 22–32)
Calcium: 8.7 mg/dL — ABNORMAL LOW (ref 8.9–10.3)
Chloride: 105 mmol/L (ref 98–111)
Creatinine, Ser: 0.78 mg/dL (ref 0.61–1.24)
GFR, Estimated: >60 mL/min (ref >60)
Glucose, Bld: 89 mg/dL (ref 70–99)
Potassium: 3.8 mmol/L (ref 3.5–5.1)
Sodium: 136 mmol/L (ref 135–145)

## 2020-04-24 MED ORDER — OXYCODONE HCL 10 MG PO TABS
5.0000 mg | ORAL_TABLET | Freq: Four times a day (QID) | ORAL | 0 refills | Status: DC | PRN
Start: 2020-04-24 — End: 2020-08-18

## 2020-04-24 MED ORDER — LEVETIRACETAM 500 MG PO TABS: 500.0000 mg | ORAL_TABLET | Freq: Two times a day (BID) | ORAL | 0 refills | Status: DC

## 2020-04-24 MED ORDER — ACETAMINOPHEN 500 MG PO TABS
1000.0000 mg | ORAL_TABLET | Freq: Four times a day (QID) | ORAL | 0 refills | Status: DC | PRN
Start: 2020-04-24 — End: 2022-11-03

## 2020-04-24 NOTE — Discharge Instructions (Addendum)
Continue to wear your c-collar until you follow up with a neurosurgeon. Make an appointment with your PCP to discuss a safe time to resume your aspirin and plavix.    Subdural Hematoma  A subdural hematoma is a collection of blood between the brain and its outer covering (dura). As the amount of blood increases, pressure builds on the brain. There are two types of subdural hematomas:  Acute. This type develops shortly after a hard, direct hit to the head and causes blood to collect very quickly. This is a medical emergency. If it is not diagnosed and treated quickly, it can lead to severe brain injury or death.  Chronic. This is when bleeding develops more slowly, over weeks or months. In some cases, this type does not cause symptoms. What are the causes? This condition is caused by bleeding (hemorrhage) from a broken (ruptured) blood vessel. In most cases, a blood vessel ruptures and bleeds because of a head injury, such as from a hard, direct hit. Head injuries can happen in car accidents, falls, assaults, or while playing sports. In rare cases, a hemorrhage can happen without a known cause (spontaneously), especially if you take blood thinners (anticoagulants). What increases the risk? This condition is more likely to develop in:  Older people.  Infants.  People who take blood thinners.  People who have head injuries.  People who abuse alcohol. What are the signs or symptoms? Symptoms of this condition can vary depending on the size of the hematoma. Symptoms can be mild, severe, or life-threatening. They include:  Headaches.  Nausea or vomiting.  Changes in vision, such as double vision or loss of vision.  Changes in speech or trouble understanding what people say.  Loss of balance or trouble walking.  Weakness, numbness, or tingling in the arms or legs, especially on one side of the body.  Seizures.  Change in personality.  Increased sleepiness.  Memory  loss.  Loss of consciousness.  Coma. Symptoms of acute subdural hematoma can develop over minutes or hours. Symptoms of chronic subdural hematoma may develop over weeks or months. How is this diagnosed? This condition is diagnosed based on the results of:  A physical exam.  Tests of strength, reflexes, coordination, senses, manner of walking (gait), and facial and eye movements (neurological exam).  Imaging tests, such as an MRI or a CT scan. How is this treated? Treatment for this condition depends on the type of hematoma and how severe it is. Treatment for acute hematoma may include:  Emergency surgery to drain blood or remove a blood clot.  Medicines that help the body get rid of excess fluids (diuretics). These may help to reduce pressure in the brain.  Assisted breathing (ventilation). Treatment for chronic hematoma may include:  Observation and bed rest at the hospital.  Surgery. If you take blood thinners, you may need to stop taking them for a short time. You may also be given anti-seizure (anticonvulsant) medicine. Sometimes, no treatment is needed for chronic subdural hematoma. Follow these instructions at home: Activity  Avoid situations where you could injure your head again, such as in competitive sports, downhill snow sports, and horseback riding. Do not do these activities until your health care provider approves. ? Wear protective gear, such as a helmet, when participating in activities such as biking or contact sports.  Avoid too much visual stimulation while recovering. This means limiting how much you read and limiting your screen time on a smart phone, tablet, computer, or TV.  Rest as told by your health care provider. Rest helps the brain heal.  Try to avoid activities that cause physical or mental stress. Return to work or school as told by your health care provider.  Do not lift anything that is heavier than 5 lb (2.3 kg), or the limit you are told,  until your health care provider says that it is safe.  Do not drive, ride a bike, or use heavy machinery until your health care provider approves.  Always wear your seat belt when you are in a motor vehicle. Alcohol use  Do not drink alcohol if your health care provider tells you not to drink.  If you drink alcohol, limit how much you use to: ? 0-1 drink a day for women. ? 0-2 drinks a day for men. General instructions  Monitor your symptoms, and ask people around you to do the same. Recovery from brain injuries varies. Talk with your health care provider about what to expect.  Take over-the-counter and prescription medicines only as told by your health care provider. Do not take blood thinners or NSAIDs unless your health care provider approves. These include aspirin, ibuprofen, naproxen, and warfarin.  Keep your home environment safe to reduce the risk of falling.  Keep all follow-up visits as told by your health care provider. This is important. Where to find more information  [employer] of Neurological [employer] and Stroke: MasterBoxes.it  American Academy of Neurology (AAN): http://keith.biz/  B[employer] of [place]: www.biausa.org Get help right away if you:  Are taking blood thinners and you fall or you experience minor trauma to the head. If you take any blood thinners, even a very small injury can cause a subdural hematoma.  Have a bleeding disorder and you fall or you experience minor trauma to the head.  Develop any of the following symptoms after a head injury: ? Clear fluid draining from your nose or ears. ? Nausea or vomiting. ? Changes in speech or trouble understanding what people say. ? Seizures. ? Drowsiness or a decrease in alertness. ? Double vision. ? Numbness or inability to move (paralysis) in any part of your body. ? Difficulty walking or poor coordination. ? Difficulty thinking. ? Confusion or forgetfulness. ? Personality  changes. ? Irrational or aggressive behavior. These symptoms may represent a serious problem that is an emergency. Do not wait to see if the symptoms will go away. Get medical help right away. Call your local emergency services (911 in the [place] Do not drive yourself to the hospital. Summary  A subdural hematoma is a collection of blood between the brain and its outer covering (dura).  Treatment for this condition depends on what type of subdural hematoma you have and how severe it is.  Symptoms can vary from mild to severe to life-threatening.  Monitor your symptoms, and ask others around you to do the same. This information is not intended to replace advice given to you by your health care provider. Make sure you discuss any questions you have with your health care provider. Document Revised: 04/03/2018 Document Reviewed: 04/03/2018 Elsevier Patient Education  [place].   Splenic Injury  A splenic injury is an injury of the spleen. The spleen is an organ located in the upper left area of the abdomen, just under the ribs. The spleen filters and cleans the blood. It also stores blood cells and destroys cells that are worn out. The spleen also plays an important role in fighting disease. Splenic injuries  can vary. In some cases, the spleen may only be bruised, with some bleeding inside the covering and around the spleen. Splenic injuries may also cause a deep tear or cut into the spleen (lacerated spleen). Some splenic injuries can cause the spleen to break open (rupture). What are the causes? This condition may be caused by a direct blow (trauma) to the spleen. Trauma can result from:  Car accidents.  Contact sports.  Falls.  Penetrating injuries. These can be caused by gunshot wounds or sharp objects such as a knife. What increases the risk? You may be at greater risk for a splenic injury if you have a disease that can cause the spleen to become enlarged. These  include:  Alcoholic liver disease.  Viral infections, especially mononucleosis.  Certain inflammatory diseases, such as lupus.  Certain cancers, especially those that involve the lymphatic system.  Cystic fibrosis. What are the signs or symptoms? Symptoms of this condition depend on the severity of the injury. A minor injury often causes no symptoms or only minor pain in the abdomen. A major injury can result in severe bleeding, causing your blood pressure to decrease rapidly. This in turn will cause symptoms such as:  Dizziness or light-headedness.  Rapid heart rate.  Difficulty breathing.  Fainting.  Sweating with clammy skin. Other symptoms of a splenic injury may include:  Very bad abdominal pain.  Pain in the left shoulder.  Pain when the abdomen is pressed (tenderness).  Nausea.  Swelling or bruising of the abdomen. How is this diagnosed? This condition may be diagnosed based on:  Your symptoms and medical history, especially if you were recently in an accident or you recently got hurt.  A physical exam.  Imaging tests, such as: ? CT scan. ? Ultrasound. You may have frequent blood tests for a few days after the injury to monitor your condition. How is this treated? Treatment depends on the type of splenic injury you have and how bad it is.  Less severe injuries may be treated with: ? Observation. ? Interventional radiology. This involves using flexible tubes (catheters) to stop the bleeding from inside the blood vessel.  More severe injuries may require hospitalization in the intensive care unit (ICU). While you are in the hospital: ? Your fluid and blood levels will be monitored closely. ? You will get fluids through an IV as needed. ? You may need follow-up scans to check whether your spleen is able to heal itself. If the injury is getting worse, you may need surgery. ? You may receive donated blood (transfusion). ? You may have a long needle inserted  into your abdomen to remove any blood that has collected inside the spleen (hematoma).  If your blood pressure is too low, you may need emergency surgery. This may include: ? Repairing a laceration. ? Removing part of the spleen. ? Removing the entire spleen (splenectomy). Follow these instructions at home: Activity  Rest as told by your health care provider.  Avoid sitting for a long time without moving. Get up to take short walks every 1-2 hours. This is important to improve blood flow and breathing. Ask for help if you feel weak or unsteady.  Do not participate in any activity that takes a lot of effort until your health care provider says that it is safe.  Do not take part in contact sports until your health care provider says it is safe to do so.  Do not lift anything that is heavier than 10 lb (  4.5 kg), or the limit that you are told, until your health care provider says that it is safe. General instructions  Take over-the-counter and prescription medicines only as told by your health care provider.  Stay up-to-date on vaccines as told by your health care provider.  Follow instructions from your health care provider about eating or drinking restrictions.  Do not drink alcohol.  Do not use any products that contain nicotine or tobacco, such as cigarettes and e-cigarettes. These may delay healing after an injury. If you need help quitting, ask your health care provider.  Keep all follow-up visits as told by your health care provider. This is important. Get help right away if you have:  A fever.  New or increasing pain in your abdomen or in your left shoulder.  Signs or symptoms of internal bleeding. Watch for: ? Sweating. ? Dizziness. ? Weakness. ? Cold and clammy skin. ? Fainting.  Chest pain or difficulty breathing. Summary  The spleen is an organ located in the upper left area of the abdomen, just under the ribs.  A splenic injury is an injury of the spleen.  This may be caused by a direct blow (trauma) to the spleen.  You may be at greater risk for a splenic injury if you have a disease that can cause the spleen to become enlarged.  A minor injury often causes no symptoms or only minor pain in the abdomen. A major injury can result in severe bleeding, causing your blood pressure to decrease rapidly.  Treatment depends on the type of splenic injury you have and how bad it is. This information is not intended to replace advice given to you by your health care provider. Make sure you discuss any questions you have with your health care provider. Document Revised: 07/15/2017 Document Reviewed: 04/27/2017 Elsevier Patient Education  [place].

## 2020-04-24 NOTE — Discharge Summary (Signed)
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 extremity numbness or paresthesias. Workup revea.     TBI/interhemispheric SDH  Neurosurgery consulted and recommended supportive care. Plavix and aspirin held upon admission. Repeat head CT the following day was stable. Patient worked with TBI team therapies, and will have home health therapies to continue rehab at discharge.  Type 1 odontoid FX  Neurosurgery was consulted and recommended nonoperative management in collar. He will follow up with Dr. Annette Stable after discharge.  Grade 2 spleen laceration  Patient was placed on bed rest over night. Hemoglobin monitored and remained stable without the need for blood transfusion.  CAD, multiple prior Mis, prior CABG Aspirin/plavix held on admission due to  closed head injury. Patient will follow up with his PCP about 2 weeks after discharge to discuss when to restart these medications.  Patient worked with therapies during this admission who recommended home health therapies when medically stable for discharge. On 04/24/20, the patient was voiding well, tolerating diet, ambulating well, pain well controlled, vital signs stable and felt stable for discharge home.  Patient will follow up as below and knows to call with questions or concerns.    I have personally reviewed  the patients medication history on the [place] controlled substance database.    Allergies as of 04/24/2020   No Known Allergies     Medication List    STOP taking these medications   aspirin EC 81 MG tablet   clopidogrel 75 MG tablet Commonly known as: PLAVIX   cyclobenzaprine 5 MG tablet Commonly known as: FLEXERIL   ferrous sulfate 325 (65 FE) MG tablet Commonly known as: FerrouSul   HYDROcodone-acetaminophen 7.5-325 MG tablet Commonly known as: Norco     TAKE these medications   acetaminophen 500 MG tablet Commonly known as: TYLENOL Take 2 tablets (1,000 mg total) by mouth every 6 (six) hours as needed.   albuterol 108 (90 Base) MCG/ACT inhaler Commonly known as: VENTOLIN HFA Inhale 2 puffs into the lungs every 6 (six) hours as needed for wheezing or shortness of breath.   amLODipine 10 MG tablet Commonly known as: NORVASC TAKE 1 TABLET BY MOUTH  DAILY   atorvastatin 40 MG tablet Commonly known as: LIPITOR Take 1 tablet by mouth once daily   cholecalciferol 25 MCG (1000 UNIT) tablet Commonly known as: VITAMIN D3 Take 1,000 Units by mouth daily.   docusate sodium 100 MG capsule Commonly known as: Colace Take 1 capsule (100 mg total) by mouth 2 (two) times daily. What changed:   when to take this  reasons to take this   gabapentin 300 MG capsule Commonly known as: NEURONTIN Take 300 mg by mouth at bedtime.   isosorbide mononitrate 120 MG 24 hr tablet Commonly known as: IMDUR TAKE 1 TABLET BY MOUTH  DAILY   levETIRAcetam 500 MG tablet Commonly known as: KEPPRA Take 1 tablet (500 mg total) by mouth 2 (two) times daily for 6 days.   lisinopril 20 MG tablet Commonly known as: ZESTRIL TAKE 1 TABLET BY MOUTH ONCE DAILY   Magnesium 200 MG Tabs Take 200 mg by mouth daily.   methocarbamol 500 MG tablet Commonly known as: ROBAXIN Take 500 mg by mouth every 6 (six) hours as needed for muscle spasms.   metoprolol tartrate 25 MG tablet Commonly known as:  LOPRESSOR Take 1 tablet (25 mg total) by mouth 2 (two) times daily.   nitroGLYCERIN 0.4 MG SL tablet Commonly known as: NITROSTAT Place 0.4 mg under the tongue every 5 (five) minutes as needed for chest pain.   Oxycodone HCl 10 MG Tabs Take 0.5-1 tablets (5-10 mg total) by mouth every 6 (six) hours as needed for severe pain (not relieved by tylenol). What changed:   medication strength  how much to take  reasons to take this   pantoprazole 40 MG tablet Commonly known as: PROTONIX Take 40 mg by mouth daily.   polyethylene glycol 17 g packet Commonly known as: MIRALAX / GLYCOLAX Take 17 g by mouth 2 (two) times daily.   pyridOXINE 100 MG tablet Commonly known as: VITAMIN B-6 Take 100 mg by mouth daily.   vitamin E 180 MG (400 UNITS) capsule Take 400 Units by mouth  daily.         Follow-up Information    Earnie Larsson, MD. Schedule an appointment as soon as possible for a visit in 2 week(s).   Specialty: Neurosurgery Why: for follow up from recent TBI and cervical spine fracture Contact information: [address] [place] [address] [phone]        primary care provider Follow up.   Why: for follow up from recent hospitalization and to discuss resumption of aspirin and plavix. Contact information: [facility] [address]  [place], [place] [address]  [phone]                 Signed: Wellington Hampshire, [facility] 04/24/2020, 11:32 AM Please see Amion for pager number during day hours 7:00am-4:30pm
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
## 2020-04-24 NOTE — Progress Notes (Signed)
After summary visit reviewed with patient and patient's wife.  All questions answer including follow up visits with neurosurgery.  All belongings return to patient.  Patient alert and oriented with stable vital signs at time of discharge

## 2020-04-24 NOTE — TOC Transition Note (Signed)
Transition of Care [facility]) - CM/SW Discharge Note   Patient Details  Name: AMEL GIANINO MRN: [id number] Date of Birth: [birth year]  Transition of Care [facility]) CM/SW Contact:  Ella Bodo, RN Phone Number: 04/24/2020, 1200  Clinical Narrative:  Pt medically stable for discharge home today with spouse.  Unable to secure [facility] services for therapy due to pt location and [facility] staffing issues.  Pt already receiving OP therapy services at [facility] for recent rotator cuff surgery.  He is agreeable to continued therapy services at [facility], and wife can provide transport.  Referral faxed to [facility] rehab for PT/OT at [phone].       Final next level of care: OP Rehab Barriers to Discharge: Barriers Resolved   Patient Goals and CMS Choice Patient states their goals for this hospitalization and ongoing recovery are:: to go home CMS Medicare.gov Compare Post Acute Care list provided to:: Patient Choice offered to / list presented to : Patient  Discharge Placement                       Discharge Plan and Services   Discharge Planning Services: CM Consult Post Acute Care Choice: Home Health                               Social Determinants of Health (SDOH) Interventions     Readmission Risk Interventions No flowsheet data found.  Reinaldo Raddle, RN, BSN  Trauma/Neuro ICU Case Manager [phone]

## 2020-05-31 ENCOUNTER — Other Ambulatory Visit: Payer: Self-pay | Admitting: Cardiovascular Disease

## 2020-06-25 ENCOUNTER — Telehealth: Payer: Self-pay | Admitting: Cardiovascular Disease

## 2020-06-25 NOTE — Telephone Encounter (Signed)
*  STAT* If patient is at the pharmacy, call can be transferred to refill team.   1. Which medications need to be refilled? (please list name of each medication and dose if known)  Plavix  2. Which pharmacy/location (including street and city if local pharmacy) is medication to be sent to? [facility] 3. Do they need a 30 day or 90 day supply? 90 days and refills

## 2020-08-18 ENCOUNTER — Other Ambulatory Visit: Payer: Self-pay

## 2020-08-18 ENCOUNTER — Encounter: Payer: Self-pay | Admitting: Cardiovascular Disease

## 2020-08-18 ENCOUNTER — Ambulatory Visit: Payer: Medicare Other | Admitting: Cardiovascular Disease

## 2020-08-18 DIAGNOSIS — Z951 Presence of aortocoronary bypass graft: Secondary | ICD-10-CM

## 2020-08-18 DIAGNOSIS — E785 Hyperlipidemia, unspecified: Secondary | ICD-10-CM

## 2020-08-18 DIAGNOSIS — I25810 Atherosclerosis of coronary artery bypass graft(s) without angina pectoris: Secondary | ICD-10-CM

## 2020-08-18 DIAGNOSIS — Z720 Tobacco use: Secondary | ICD-10-CM

## 2020-08-18 DIAGNOSIS — I252 Old myocardial infarction: Secondary | ICD-10-CM | POA: Diagnosis not present

## 2020-08-18 DIAGNOSIS — I10 Essential (primary) hypertension: Secondary | ICD-10-CM | POA: Diagnosis not present

## 2020-08-18 MED ORDER — NITROGLYCERIN 0.4 MG SL SUBL: 0.4000 mg | SUBLINGUAL_TABLET | SUBLINGUAL | 4 refills | Status: DC | PRN

## 2020-08-18 MED ORDER — CLOPIDOGREL BISULFATE 75 MG PO TABS: 75.0000 mg | ORAL_TABLET | Freq: Every day | ORAL | 1 refills | Status: DC

## 2020-08-18 NOTE — Patient Instructions (Signed)
Medication Instructions:  The current medical regimen is effective;  continue present plan and medications. Restart Plavix (sent to pharmacy)  *If you need a refill on your cardiac medications before your next appointment, please call your pharmacy*   Lab Work: Fasting lab work (CBC, CMET, TSH, LIPID)  If you have labs (blood work) drawn today and your tests are completely normal, you will receive your results only by: Marland Kitchen MyChart Message (if you have MyChart) OR . A paper copy in the mail If you have any lab test that is abnormal or we need to change your treatment, we will call you to review the results.   Follow-Up: At [facility], you and your health needs are our priority.  As part of our continuing mission to provide you with exceptional heart care, we have created designated Provider Care Teams.  These Care Teams include your primary Cardiologist (physician) and Advanced Practice Providers (APPs -  Physician Assistants and Nurse Practitioners) who all work together to provide you with the care you need, when you need it.  We recommend signing up for the patient portal called "MyChart".  Sign up information is provided on this After Visit Summary.  MyChart is used to connect with patients for Virtual Visits (Telemedicine).  Patients are able to view lab/test results, encounter notes, upcoming appointments, etc.  Non-urgent messages can be sent to your provider as well.   To learn more about what you can do with MyChart, go to [email or web address].    Your next appointment:   12 month(s)  The format for your next appointment:   In Person  Provider:   Shelva Majestic, MD
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
## 2020-08-21 ENCOUNTER — Encounter: Payer: Self-pay | Admitting: Cardiovascular Disease

## 2020-08-25 ENCOUNTER — Other Ambulatory Visit: Payer: Self-pay | Admitting: Physician Assistant

## 2020-11-11 ENCOUNTER — Telehealth: Payer: Self-pay | Admitting: Cardiovascular Disease

## 2020-11-11 NOTE — Telephone Encounter (Signed)
Brandi called to see if we receive the fax bout cardiac clearance. Please advise   Dear Dr. Claiborne Billings,    This patient will be undergoing urologic surgery, specifically Ureteroscopic Stone Extraction in the near future. We request the following information so that we may safely proceed with surgical intervention:    Cardiac clearance: Please provide cardiac clearance for this patient    Echo results: Not required     Stress test results: Not required     Anticoagulation: Please provide instruction regarding when the patient may come off of Plavix prior to surgery and instructions for bridging medications, if required.   Please fax the above requested materials to [phone] Attn: Carmelina Noun, PA-C   Thank you in advance for your assistance in the care of this patient.    Sincerely,    Lamonte Richer, PA-C Department of [facility] [place] [facility]

## 2020-11-11 NOTE — Telephone Encounter (Signed)
Dr. Claiborne Billings You saw this patient in April 2022 and he was stable. He has a history of CABG in 2009. Nuclear stress test 03/2019 was low risk.   May he hold plavix for ureteroscopy and stone removal?

## 2020-11-18 NOTE — Telephone Encounter (Signed)
Dr. Claiborne Billings,  Please advise on holding Plavix prior to the ureteroscopy and stone removal.  Patient procedure is scheduled for 11/24/2020.  Thank you.  Jossie Ng. Kerianna Rawlinson NP-C    11/18/2020, 10:46 AM [address] Office [phone] Fax [phone]

## 2020-11-18 NOTE — Telephone Encounter (Signed)
Per Dr. Claiborne Billings " ok to hold plavix for 5 days for urology procedure on 7/11"  Patient needs call back

## 2020-11-18 NOTE — Telephone Encounter (Signed)
Dr. Leim Fabry, Physician at [facility] called to check the status of the surgical clearance. Patient is scheduled for 11/24/20. Please follow up on her cell phone [phone])

## 2020-11-19 NOTE — Telephone Encounter (Signed)
Call placed to Dr. Leim Fabry, [phone].  Left a detailed message that pt has been cleared / pt is aware. If she needed further information to call the office back.

## 2020-11-19 NOTE — Telephone Encounter (Signed)
   Name: Robert Melton  DOB: 1951-03-19  MRN: [id number]   Primary Cardiologist: Shelva Majestic, MD  Chart reviewed as part of pre-operative protocol coverage. Patient was contacted 11/19/2020 in reference to pre-operative risk assessment for pending surgery as outlined below.  Robert Melton was last seen on 08/2020 by Dr. Claiborne Billings. History outlined with CAD s/p prior PCIs, CABG, PE, tobacco abuse, COPD, arthritis, GERD, HTN, HLD. Last cath 2015. Last stress test 03/2019 was low risk study with a small area of ischemia in the apical inferior and apical region suggestive of distal RCA territory - based on this study, Dr. Claiborne Billings had granted him surgical clearance for an unrelated orthopedic procedure at that time. I reached out to patient for update on how he is doing. The patient affirms he has been doing well without any new cardiac symptoms. Able to mow yard withou angina. Therefore, based on ACC/AHA guidelines, the patient would be at acceptable risk for the planned procedure without further cardiovascular testing.   Per Dr. Claiborne Billings, [place] to hold Plavix for 5 days for urology procedure. Patient confirms he has already begun holding.   The patient was advised that if he develops new symptoms prior to surgery to contact our office to arrange for a follow-up visit, and he verbalized understanding.  Will route to callback to call # listed below for Dr. Zigmund Daniel as requested. Will also send fax via [place] fax function.   Charlie Pitter, PA-C 11/19/2020, 10:59 AM

## 2020-12-29 ENCOUNTER — Other Ambulatory Visit: Payer: Self-pay | Admitting: Cardiovascular Disease

## 2021-01-05 ENCOUNTER — Other Ambulatory Visit: Payer: Self-pay | Admitting: Cardiovascular Disease

## 2021-03-01 ENCOUNTER — Other Ambulatory Visit: Payer: Self-pay | Admitting: Cardiovascular Disease

## 2021-05-06 ENCOUNTER — Other Ambulatory Visit: Payer: Self-pay | Admitting: Cardiovascular Disease

## 2021-05-13 ENCOUNTER — Other Ambulatory Visit: Payer: Self-pay | Admitting: Cardiovascular Disease

## 2021-06-09 ENCOUNTER — Telehealth: Payer: Self-pay | Admitting: Cardiovascular Disease

## 2021-06-09 NOTE — Telephone Encounter (Signed)
° °  Pre-operative Risk Assessment    Patient Name: Robert Melton  DOB: 1951/02/11 MRN: [id number]     Request for Surgical Clearance    Procedure:   Whipple Surgery for ampullary adnocarcinoma  Date of Surgery:  Clearance 06/18/21                                 Surgeon: Dr, Sheilah Pigeon Surgeon's Group or Practice Name:  [facility] Phone number:  [phone] Fax number:  [phone]   Type of Clearance Requested:   - Medical    Type of Anesthesia:  General    Additional requests/questions:   they would like for the patient to be seen by Dr. Claiborne Billings since it been a long time.  Signed, Milbert Coulter   06/09/2021, 11:07 AM

## 2021-06-10 ENCOUNTER — Other Ambulatory Visit: Payer: Self-pay | Admitting: Cardiovascular Disease

## 2021-06-10 NOTE — Telephone Encounter (Signed)
° °  Name: Robert Melton  DOB: 02-16-1951  MRN: [id number]   Primary Cardiologist: Shelva Majestic, MD  Chart reviewed as part of pre-operative protocol coverage. Patient was contacted 06/10/2021 in reference to pre-operative risk assessment for pending surgery as outlined below.  Robert Melton was last seen on 08/18/20 by Dr. Claiborne Billings.  Since that day, Robert Melton has done well.  He is able to complete 4.0 METS (flight of steps, grocery store with cart) without angina. He does have a history of ischemic heart disease. He had a reassuring nuclear stress test in 2020. Subsequently, he has undergone surgery without cardiac complications. He understands he is hat higher risk for MACE during the perioperative period. Additional testing would not mitigate this risk.   Patient was previously cleared by Dr. Claiborne Billings for a 5 day plavix hold. Given no interval change in his cardiac history, proceed with a 5 day plavix hold for whipple procedure. Given his extensive CAD and hx of CABG, we prefer to continue ASA throughout the perioperative period. However, if doing so will significantly increase morbidity or mortality, may hold for 5 days.   Therefore, based on ACC/AHA guidelines, the patient would be at acceptable risk for the planned procedure without further cardiovascular testing.   The patient was advised that if he develops new symptoms prior to surgery to contact our office to arrange for a follow-up visit, and he verbalized understanding.  I will route this recommendation to the requesting party via Epic fax function and remove from pre-op pool. Please call with questions.  Tami Lin Nalea Salce, PA 06/10/2021, 8:52 AM

## 2021-09-18 ENCOUNTER — Other Ambulatory Visit: Payer: Self-pay | Admitting: Cardiovascular Disease

## 2021-10-13 ENCOUNTER — Other Ambulatory Visit: Payer: Self-pay | Admitting: Cardiovascular Disease

## 2021-10-30 ENCOUNTER — Other Ambulatory Visit: Payer: Self-pay | Admitting: Cardiovascular Disease

## 2021-11-09 ENCOUNTER — Encounter: Payer: Self-pay | Admitting: Cardiovascular Disease

## 2021-11-09 ENCOUNTER — Ambulatory Visit (INDEPENDENT_AMBULATORY_CARE_PROVIDER_SITE_OTHER): Payer: Medicare Other | Admitting: Cardiovascular Disease

## 2021-11-09 VITALS — BP 120/60 | HR 71 | Ht 62.0 in | Wt 149.2 lb

## 2021-11-09 DIAGNOSIS — I25810 Atherosclerosis of coronary artery bypass graft(s) without angina pectoris: Secondary | ICD-10-CM | POA: Diagnosis not present

## 2021-11-09 DIAGNOSIS — Z951 Presence of aortocoronary bypass graft: Secondary | ICD-10-CM | POA: Diagnosis not present

## 2021-11-09 DIAGNOSIS — C801 Malignant (primary) neoplasm, unspecified: Secondary | ICD-10-CM

## 2021-11-09 DIAGNOSIS — E785 Hyperlipidemia, unspecified: Secondary | ICD-10-CM | POA: Diagnosis not present

## 2021-11-09 DIAGNOSIS — I10 Essential (primary) hypertension: Secondary | ICD-10-CM | POA: Diagnosis not present

## 2021-11-09 DIAGNOSIS — I252 Old myocardial infarction: Secondary | ICD-10-CM
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
## 2021-11-13 ENCOUNTER — Encounter: Payer: Self-pay | Admitting: Cardiovascular Disease

## 2021-11-20 ENCOUNTER — Telehealth: Payer: Self-pay

## 2021-11-20 NOTE — Telephone Encounter (Signed)
Received records from [facility]- Dr. Abbe Amsterdam- I will send to scan.

## 2022-01-04 ENCOUNTER — Other Ambulatory Visit: Payer: Self-pay | Admitting: Cardiovascular Disease

## 2022-01-30 ENCOUNTER — Other Ambulatory Visit: Payer: Self-pay | Admitting: Cardiovascular Disease

## 2022-03-23 ENCOUNTER — Telehealth: Payer: Self-pay

## 2022-04-30 ENCOUNTER — Ambulatory Visit: Payer: Medicare Other | Attending: Cardiovascular Disease | Admitting: Cardiovascular Disease

## 2022-04-30 DIAGNOSIS — I2510 Atherosclerotic heart disease of native coronary artery without angina pectoris: Secondary | ICD-10-CM

## 2022-04-30 DIAGNOSIS — Z951 Presence of aortocoronary bypass graft: Secondary | ICD-10-CM

## 2022-04-30 DIAGNOSIS — I10 Essential (primary) hypertension: Secondary | ICD-10-CM

## 2022-04-30 DIAGNOSIS — C801 Malignant (primary) neoplasm, unspecified: Secondary | ICD-10-CM

## 2022-04-30 DIAGNOSIS — E785 Hyperlipidemia, unspecified: Secondary | ICD-10-CM

## 2022-04-30 DIAGNOSIS — I252 Old myocardial infarction: Secondary | ICD-10-CM
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
## 2022-04-30 NOTE — Patient Instructions (Signed)
Medication Instructions:  No Changes *If you need a refill on your cardiac medications before your next appointment, please call your pharmacy*   Lab Work: CMET, Lipid Panel, Lipoprotein . If you have labs (blood work) drawn today and your tests are completely normal, you will receive your results only by: [place] (if you have MyChart) OR A paper copy in the mail If you have any lab test that is abnormal or we need to change your treatment, we will call you to review the results.   Testing/Procedures: No Testing   Follow-Up: At [facility], you and your health needs are our priority.  As part of our continuing mission to provide you with exceptional heart care, we have created designated Provider Care Teams.  These Care Teams include your primary Cardiologist (physician) and Advanced Practice Providers (APPs -  Physician Assistants and Nurse Practitioners) who all work together to provide you with the care you need, when you need it.  We recommend signing up for the patient portal called "MyChart".  Sign up information is provided on this After Visit Summary.  MyChart is used to connect with patients for Virtual Visits (Telemedicine).  Patients are able to view lab/test results, encounter notes, upcoming appointments, etc.  Non-urgent messages can be sent to your provider as well.   To learn more about what you can do with MyChart, go to [email or web address].    Your next appointment:   1 year(s)  The format for your next appointment:   In Person  Provider:   Shelva Majestic, MD

## 2022-05-06 ENCOUNTER — Encounter: Payer: Self-pay | Admitting: Cardiovascular Disease

## 2022-05-28 NOTE — Telephone Encounter (Signed)
[id number] 

## 2022-06-25 ENCOUNTER — Other Ambulatory Visit: Payer: Self-pay | Admitting: Cardiovascular Disease

## 2022-11-15 DEATH — deceased
